# Patient Record
Sex: Female | Born: 1980
Health system: Southern US, Community
[De-identification: ages and names within clinical notes are randomized; demographics above are authoritative.]

## PROBLEM LIST (undated history)

## (undated) DIAGNOSIS — E785 Hyperlipidemia, unspecified: Secondary | ICD-10-CM

## (undated) DIAGNOSIS — IMO0002 Reserved for concepts with insufficient information to code with codable children: Secondary | ICD-10-CM

## (undated) DIAGNOSIS — D649 Anemia, unspecified: Secondary | ICD-10-CM

## (undated) DIAGNOSIS — Z91199 Patient's noncompliance with other medical treatment and regimen due to unspecified reason: Secondary | ICD-10-CM

## (undated) DIAGNOSIS — R200 Anesthesia of skin: Secondary | ICD-10-CM

## (undated) DIAGNOSIS — I10 Essential (primary) hypertension: Secondary | ICD-10-CM

## (undated) DIAGNOSIS — E119 Type 2 diabetes mellitus without complications: Secondary | ICD-10-CM

## (undated) DIAGNOSIS — G43909 Migraine, unspecified, not intractable, without status migrainosus: Secondary | ICD-10-CM

## (undated) DIAGNOSIS — R87619 Unspecified abnormal cytological findings in specimens from cervix uteri: Secondary | ICD-10-CM

## (undated) DIAGNOSIS — T7840XA Allergy, unspecified, initial encounter: Secondary | ICD-10-CM

## (undated) DIAGNOSIS — N189 Chronic kidney disease, unspecified: Secondary | ICD-10-CM

## (undated) DIAGNOSIS — Z9119 Patient's noncompliance with other medical treatment and regimen: Secondary | ICD-10-CM

## (undated) HISTORY — DX: Hyperlipidemia, unspecified: E78.5

## (undated) HISTORY — DX: Migraine, unspecified, not intractable, without status migrainosus: G43.909

## (undated) HISTORY — DX: Anemia, unspecified: D64.9

## (undated) HISTORY — DX: Essential (primary) hypertension: I10

## (undated) HISTORY — DX: Chronic kidney disease, unspecified: N18.9

## (undated) HISTORY — DX: Unspecified abnormal cytological findings in specimens from cervix uteri: R87.619

## (undated) HISTORY — DX: Allergy, unspecified, initial encounter: T78.40XA

## (undated) HISTORY — PX: WISDOM TOOTH EXTRACTION: SHX21

## (undated) HISTORY — DX: Reserved for concepts with insufficient information to code with codable children: IMO0002

---
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
## 2002-09-01 ENCOUNTER — Emergency Department (HOSPITAL_COMMUNITY): Admission: EM | Admit: 2002-09-01 | Discharge: 2002-09-01 | Payer: Self-pay | Admitting: Emergency Medicine

## 2003-02-21 ENCOUNTER — Emergency Department (HOSPITAL_COMMUNITY): Admission: EM | Admit: 2003-02-21 | Discharge: 2003-02-21 | Payer: Self-pay | Admitting: Emergency Medicine

## 2004-06-06 ENCOUNTER — Ambulatory Visit: Payer: Self-pay | Admitting: Internal Medicine

## 2004-06-06 ENCOUNTER — Ambulatory Visit: Payer: Self-pay | Admitting: *Deleted

## 2004-06-22 ENCOUNTER — Ambulatory Visit: Payer: Self-pay | Admitting: Internal Medicine

## 2004-07-11 ENCOUNTER — Ambulatory Visit: Payer: Self-pay | Admitting: Internal Medicine

## 2004-08-21 ENCOUNTER — Ambulatory Visit: Payer: Self-pay | Admitting: Internal Medicine

## 2004-09-19 ENCOUNTER — Ambulatory Visit: Payer: Self-pay | Admitting: Internal Medicine

## 2004-12-19 ENCOUNTER — Ambulatory Visit: Payer: Self-pay | Admitting: Internal Medicine

## 2009-08-02 ENCOUNTER — Emergency Department (HOSPITAL_COMMUNITY): Admission: EM | Admit: 2009-08-02 | Discharge: 2009-08-02 | Payer: Self-pay | Admitting: Family Medicine

## 2009-10-25 ENCOUNTER — Encounter (INDEPENDENT_AMBULATORY_CARE_PROVIDER_SITE_OTHER): Payer: Self-pay | Admitting: Family Medicine

## 2009-10-25 ENCOUNTER — Ambulatory Visit: Payer: Self-pay | Admitting: Internal Medicine

## 2009-10-25 LAB — CONVERTED CEMR LAB
Basophils Relative: 0 % (ref 0–1)
CO2: 23 meq/L (ref 19–32)
Calcium: 9.9 mg/dL (ref 8.4–10.5)
Chloride: 99 meq/L (ref 96–112)
Cholesterol: 246 mg/dL — ABNORMAL HIGH (ref 0–200)
Eosinophils Absolute: 0.1 10*3/uL (ref 0.0–0.7)
Glucose, Bld: 343 mg/dL — ABNORMAL HIGH (ref 70–99)
Hemoglobin: 11.8 g/dL — ABNORMAL LOW (ref 12.0–15.0)
MCHC: 31.6 g/dL (ref 30.0–36.0)
MCV: 79.7 fL (ref 78.0–100.0)
Microalb, Ur: 11.3 mg/dL — ABNORMAL HIGH (ref 0.00–1.89)
Monocytes Absolute: 0.4 10*3/uL (ref 0.1–1.0)
Monocytes Relative: 6 % (ref 3–12)
Neutro Abs: 2.8 10*3/uL (ref 1.7–7.7)
RBC: 4.68 M/uL (ref 3.87–5.11)
Sodium: 135 meq/L (ref 135–145)
Total Bilirubin: 0.5 mg/dL (ref 0.3–1.2)
Total Protein: 7.8 g/dL (ref 6.0–8.3)
Triglycerides: 106 mg/dL (ref <150)
VLDL: 21 mg/dL (ref 0–40)

## 2009-10-26 ENCOUNTER — Ambulatory Visit: Payer: Self-pay | Admitting: Internal Medicine

## 2009-10-26 ENCOUNTER — Encounter (INDEPENDENT_AMBULATORY_CARE_PROVIDER_SITE_OTHER): Payer: Self-pay | Admitting: Family Medicine

## 2009-10-31 ENCOUNTER — Encounter (INDEPENDENT_AMBULATORY_CARE_PROVIDER_SITE_OTHER): Payer: Self-pay | Admitting: Family Medicine

## 2009-10-31 LAB — CONVERTED CEMR LAB
Iron: 98 ug/dL (ref 42–145)
Saturation Ratios: 28 % (ref 20–55)
TIBC: 346 ug/dL (ref 250–470)
UIBC: 248 ug/dL

## 2009-11-08 ENCOUNTER — Ambulatory Visit: Payer: Self-pay | Admitting: Internal Medicine

## 2009-12-08 ENCOUNTER — Ambulatory Visit: Payer: Self-pay | Admitting: Internal Medicine

## 2009-12-08 ENCOUNTER — Encounter (INDEPENDENT_AMBULATORY_CARE_PROVIDER_SITE_OTHER): Payer: Self-pay | Admitting: Family Medicine

## 2009-12-08 LAB — CONVERTED CEMR LAB
AST: 10 units/L (ref 0–37)
Alkaline Phosphatase: 75 units/L (ref 39–117)
BUN: 12 mg/dL (ref 6–23)
C-Peptide: 0.86 ng/mL (ref 0.80–3.90)
Creatinine, Ser: 0.72 mg/dL (ref 0.40–1.20)
Glucose, Bld: 471 mg/dL — ABNORMAL HIGH (ref 70–99)
Hgb A2 Quant: 2.5 % (ref 2.2–3.2)
Hgb A: 97.5 % (ref 96.8–97.8)
Hgb F Quant: 0 % (ref 0.0–2.0)
Hgb S Quant: 0 % (ref 0.0–0.0)
Potassium: 4.7 meq/L (ref 3.5–5.3)
Total Bilirubin: 0.5 mg/dL (ref 0.3–1.2)

## 2009-12-13 ENCOUNTER — Ambulatory Visit: Payer: Self-pay | Admitting: Internal Medicine

## 2010-01-12 ENCOUNTER — Ambulatory Visit: Payer: Self-pay | Admitting: Family Medicine

## 2010-02-16 ENCOUNTER — Encounter (INDEPENDENT_AMBULATORY_CARE_PROVIDER_SITE_OTHER): Payer: Self-pay | Admitting: Family Medicine

## 2010-02-16 ENCOUNTER — Ambulatory Visit: Payer: Self-pay | Admitting: Family Medicine

## 2010-02-16 LAB — CONVERTED CEMR LAB
Calcium: 9.5 mg/dL (ref 8.4–10.5)
Cholesterol: 200 mg/dL (ref 0–200)
Creatinine, Ser: 0.71 mg/dL (ref 0.40–1.20)
HDL: 52 mg/dL (ref >39)
Hgb A1c MFr Bld: 8.2 % — ABNORMAL HIGH (ref <5.7)
Sodium: 143 meq/L (ref 135–145)
Total CHOL/HDL Ratio: 3.8

## 2010-02-22 ENCOUNTER — Ambulatory Visit: Payer: Self-pay | Admitting: Internal Medicine

## 2010-05-25 ENCOUNTER — Ambulatory Visit: Payer: Self-pay | Admitting: Internal Medicine

## 2010-05-25 ENCOUNTER — Encounter (INDEPENDENT_AMBULATORY_CARE_PROVIDER_SITE_OTHER): Payer: Self-pay | Admitting: Family Medicine

## 2010-05-25 LAB — CONVERTED CEMR LAB
ALT: 15 units/L (ref 0–35)
Cholesterol: 107 mg/dL (ref 0–200)
HDL: 40 mg/dL (ref >39)
Hgb A1c MFr Bld: 7.3 % — ABNORMAL HIGH (ref <5.7)
Total CHOL/HDL Ratio: 2.7
VLDL: 10 mg/dL (ref 0–40)

## 2010-07-31 ENCOUNTER — Encounter (INDEPENDENT_AMBULATORY_CARE_PROVIDER_SITE_OTHER): Payer: Self-pay | Admitting: *Deleted

## 2010-07-31 LAB — CONVERTED CEMR LAB
Albumin: 3.9 g/dL (ref 3.5–5.2)
BUN: 4 mg/dL — ABNORMAL LOW (ref 6–23)
CO2: 22 meq/L (ref 19–32)
Calcium: 8.9 mg/dL (ref 8.4–10.5)
Chloride: 110 meq/L (ref 96–112)
Eosinophils Absolute: 0.5 10*3/uL (ref 0.0–0.7)
Glucose, Bld: 108 mg/dL — ABNORMAL HIGH (ref 70–99)
HCT: 31.2 % — ABNORMAL LOW (ref 36.0–46.0)
Hemoglobin: 9.9 g/dL — ABNORMAL LOW (ref 12.0–15.0)
Lipase: 48 units/L (ref 0–75)
Lymphocytes Relative: 31 % (ref 12–46)
Lymphs Abs: 2 10*3/uL (ref 0.7–4.0)
MCHC: 31.7 g/dL (ref 30.0–36.0)
Monocytes Relative: 9 % (ref 3–12)
Platelets: 253 10*3/uL (ref 150–400)
Potassium: 4.3 meq/L (ref 3.5–5.3)
RDW: 13.9 % (ref 11.5–15.5)
TSH: 3.155 microintl units/mL (ref 0.350–4.500)

## 2010-08-01 ENCOUNTER — Encounter (INDEPENDENT_AMBULATORY_CARE_PROVIDER_SITE_OTHER): Payer: Self-pay | Admitting: Family Medicine

## 2010-08-01 ENCOUNTER — Encounter (INDEPENDENT_AMBULATORY_CARE_PROVIDER_SITE_OTHER): Payer: Self-pay | Admitting: *Deleted

## 2010-08-01 LAB — CONVERTED CEMR LAB: Ferritin: 20 ng/mL (ref 10–291)

## 2011-01-03 LAB — POCT I-STAT, CHEM 8
Calcium, Ion: 1.15 mmol/L (ref 1.12–1.32)
Chloride: 101 mEq/L (ref 96–112)
HCT: 39 % (ref 36.0–46.0)
Hemoglobin: 13.3 g/dL (ref 12.0–15.0)

## 2011-06-11 ENCOUNTER — Other Ambulatory Visit (HOSPITAL_COMMUNITY): Admission: RE | Admit: 2011-06-11 | Payer: Self-pay | Source: Ambulatory Visit | Admitting: Family Medicine

## 2011-06-11 ENCOUNTER — Other Ambulatory Visit: Payer: Self-pay | Admitting: Family Medicine

## 2011-07-31 ENCOUNTER — Other Ambulatory Visit (HOSPITAL_COMMUNITY): Payer: Self-pay | Admitting: Oral and Maxillofacial Surgery

## 2011-07-31 DIAGNOSIS — L0291 Cutaneous abscess, unspecified: Secondary | ICD-10-CM

## 2011-07-31 DIAGNOSIS — R220 Localized swelling, mass and lump, head: Secondary | ICD-10-CM

## 2011-08-02 ENCOUNTER — Ambulatory Visit (HOSPITAL_COMMUNITY)
Admission: RE | Admit: 2011-08-02 | Discharge: 2011-08-02 | Disposition: A | Discharge status: Home / Self Care | Payer: Self-pay | Source: Ambulatory Visit | Attending: Oral and Maxillofacial Surgery | Admitting: Oral and Maxillofacial Surgery

## 2011-08-02 DIAGNOSIS — R220 Localized swelling, mass and lump, head: Secondary | ICD-10-CM | POA: Insufficient documentation

## 2011-08-02 DIAGNOSIS — L0291 Cutaneous abscess, unspecified: Secondary | ICD-10-CM

## 2011-08-02 MED ORDER — IOHEXOL 300 MG/ML  SOLN
80.0000 mL | Freq: Once | INTRAMUSCULAR | Status: AC | PRN
  Administered 2011-08-02: 80 mL via INTRAVENOUS

## 2011-08-09 ENCOUNTER — Ambulatory Visit (INDEPENDENT_AMBULATORY_CARE_PROVIDER_SITE_OTHER): Payer: Self-pay | Admitting: Family Medicine

## 2011-08-09 ENCOUNTER — Encounter: Payer: Self-pay | Admitting: *Deleted

## 2011-08-09 ENCOUNTER — Other Ambulatory Visit (HOSPITAL_COMMUNITY)
Admission: RE | Admit: 2011-08-09 | Discharge: 2011-08-09 | Disposition: A | Discharge status: Home / Self Care | Payer: Self-pay | Source: Ambulatory Visit | Attending: Obstetrics & Gynecology | Admitting: Obstetrics & Gynecology

## 2011-08-09 ENCOUNTER — Inpatient Hospital Stay: Admission: RE | Admit: 2011-08-09 | Payer: Self-pay | Source: Ambulatory Visit

## 2011-08-09 DIAGNOSIS — R87610 Atypical squamous cells of undetermined significance on cytologic smear of cervix (ASC-US): Secondary | ICD-10-CM

## 2011-08-09 DIAGNOSIS — Z01812 Encounter for preprocedural laboratory examination: Secondary | ICD-10-CM

## 2011-08-09 DIAGNOSIS — R87619 Unspecified abnormal cytological findings in specimens from cervix uteri: Secondary | ICD-10-CM | POA: Insufficient documentation

## 2011-08-09 LAB — POCT PREGNANCY, URINE: Preg Test, Ur: NEGATIVE

## 2011-08-09 NOTE — Patient Instructions (Signed)
Colposcopy Colposcopy is a procedure that uses a special lighted microscope (colposcope). It examines your cervix and vagina, or the area around the outside of the vagina, for signs of disease or abnormalities in the cells. You may be sent to a specialist (gynecologist) to do the colposcopy. A biopsy (tissue sample) may be collected during a colposcopy, if the caregiver finds any unusual cells. The biopsy is sent to the lab for further testing, and the results are reported back to your caregiver. A WOMAN MAY NEED THIS PROCEDURE IF:  She has had an abnormal pap smear (taking cells from the cervix for testing).   She has a sore on her cervix, and a Pap test was normal.   The Pap test suggests human papilloma virus (HPV). This virus can cause genital warts and is linked to the development of cervical cancer.   She has genital warts on the cervix, or in or around the outside of the vagina.   Her mother took the drug DES while pregnant.   She has painful intercourse.   She has vaginal bleeding, especially after sexual intercourse.   There is a need to evaluate the results of previous treatment.  BEFORE THE PROCEDURE   Colposcopy is done when you are not having a menstrual period.   For 24 hours before the colposcopy, do not:   Douche.   Use tampons.   Use medicines, creams, or suppositories in the vagina.   Have sexual intercourse.  PROCEDURE   A colposcopy is done while a woman is lying on her back with her feet in foot rests (stirrups).   A speculum is placed inside the vagina to keep it open and to allow the caregiver to see the cervix. This is the same instrument used to do a pap smear.   The colposcope is placed outside the vagina. It is used to magnify and examine the cervix, vagina, and the area around the outside of the vagina.   A small amount of liquid solution is placed on the area that is to be viewed. This solution is placed on with a cotton applicator. This solution  makes it easier to see the abnormal cells.   Your caregiver will suck out mucus and cells from the canal of the cervix.   Small pieces of tissue for biopsy may be taken at the same time. You may feel mild pain or discomfort when this is done.   Your caregiver will record the location of the abnormal areas and send the tissue samples to a lab for analysis.   If your caregiver biopsies the vagina or outside of the vagina, a local anesthetic (novocaine) is usually given.  AFTER THE PROCEDURE   You may have some cramping that often goes away in a few minutes. You may have some soreness for a couple of days.   You may take over-the-counter pain medicine as advised by your caregiver. Do not take aspirin because it can cause bleeding.   Lie down for a few minutes if you feel lightheaded.   You may have some bleeding or dark discharge that should stop in a few days.   You may need to wear a sanitary pad for a few days.  HOME CARE INSTRUCTIONS   Avoid sex, douching, and using tampons for a week or as directed.   Only take medicine as directed by your caregiver.   Continue to take birth control pills, if you are on them.   Not all test results are  available during your visit. If your test results are not back during the visit, make an appointment with your caregiver to find out the results. Do not assume everything is normal if you have not heard from your caregiver or the medical facility. It is important for you to follow up on all of your test results.   Follow your caregiver's advice regarding medicines, activity, follow-up visits, and follow-up Pap tests.  SEEK MEDICAL CARE IF:   You develop a rash.   You have problems with your medicine.  SEEK IMMEDIATE MEDICAL CARE IF:  You are bleeding heavily or are passing blood clots.   You develop a fever over 102 F (38.9 C), with or without chills.   You have abnormal vaginal discharge.   You are having cramps that do not go away  after taking your pain medicine.   You feel lightheaded, dizzy, or faint.   You develop stomach pain.  Document Released: 12/08/2002 Document Revised: 05/30/2011 Document Reviewed: 07/21/2009 [facility] Patient Information 2012 [place].

## 2011-08-09 NOTE — Progress Notes (Signed)
  Subjective:    Patient ID: Cathy Green, female    DOB: September 12, 1981, 30 y.o.   MRN: [id number]  HPI She had her first pap in 2011 and it was normal.  Her second pap was this year and was ASCUS with positive HR HPV DNA.   Review of Systems     Objective:   Physical Exam  Entirely normal colposcopy. I did an ECC.      Assessment & Plan:  H/o abnl pap status with normal colpo. She will get her 6 month f/u pap at [facility].

## 2011-08-09 NOTE — Progress Notes (Signed)
Addended by: Christiana Pellant A on: 08/09/2011 04:57 PM   Modules accepted: Orders

## 2011-08-31 ENCOUNTER — Telehealth: Payer: Self-pay | Admitting: Hematology and Oncology

## 2011-08-31 NOTE — Telephone Encounter (Signed)
S/w pt today re appt for 12/7 @ 1:30 pm.

## 2011-09-07 ENCOUNTER — Encounter: Payer: Self-pay | Admitting: Hematology and Oncology

## 2011-09-07 ENCOUNTER — Ambulatory Visit (HOSPITAL_BASED_OUTPATIENT_CLINIC_OR_DEPARTMENT_OTHER): Payer: Self-pay

## 2011-09-07 ENCOUNTER — Ambulatory Visit: Payer: Self-pay

## 2011-09-07 ENCOUNTER — Ambulatory Visit (HOSPITAL_BASED_OUTPATIENT_CLINIC_OR_DEPARTMENT_OTHER): Payer: Self-pay | Admitting: Hematology and Oncology

## 2011-09-07 ENCOUNTER — Telehealth: Payer: Self-pay | Admitting: Hematology and Oncology

## 2011-09-07 ENCOUNTER — Other Ambulatory Visit: Payer: Self-pay | Admitting: Hematology and Oncology

## 2011-09-07 VITALS — BP 145/96 | HR 89 | Temp 98.7°F | Ht 62.0 in | Wt 142.2 lb

## 2011-09-07 DIAGNOSIS — E785 Hyperlipidemia, unspecified: Secondary | ICD-10-CM | POA: Insufficient documentation

## 2011-09-07 DIAGNOSIS — D631 Anemia in chronic kidney disease: Secondary | ICD-10-CM | POA: Insufficient documentation

## 2011-09-07 DIAGNOSIS — E119 Type 2 diabetes mellitus without complications: Secondary | ICD-10-CM

## 2011-09-07 DIAGNOSIS — D649 Anemia, unspecified: Secondary | ICD-10-CM

## 2011-09-07 DIAGNOSIS — I10 Essential (primary) hypertension: Secondary | ICD-10-CM

## 2011-09-07 DIAGNOSIS — E1029 Type 1 diabetes mellitus with other diabetic kidney complication: Secondary | ICD-10-CM | POA: Insufficient documentation

## 2011-09-07 LAB — URINALYSIS, MICROSCOPIC - CHCC
Bilirubin (Urine): NEGATIVE
Glucose: 2 g/dL
Ketones: NEGATIVE mg/dL
Leukocyte Esterase: NEGATIVE
pH: 6 (ref 4.6–8.0)

## 2011-09-07 LAB — CBC WITH DIFFERENTIAL/PLATELET
BASO%: 0.5 % (ref 0.0–2.0)
Eosinophils Absolute: 0.1 10*3/uL (ref 0.0–0.5)
HCT: 34.7 % — ABNORMAL LOW (ref 34.8–46.6)
LYMPH%: 41 % (ref 14.0–49.7)
MCHC: 33.4 g/dL (ref 31.5–36.0)
MONO#: 0.2 10*3/uL (ref 0.1–0.9)
NEUT#: 2.1 10*3/uL (ref 1.5–6.5)
NEUT%: 52.4 % (ref 38.4–76.8)
Platelets: 283 10*3/uL (ref 145–400)
WBC: 4.1 10*3/uL (ref 3.9–10.3)
lymph#: 1.7 10*3/uL (ref 0.9–3.3)

## 2011-09-07 LAB — COMPREHENSIVE METABOLIC PANEL
Albumin: 3.2 g/dL — ABNORMAL LOW (ref 3.5–5.2)
Alkaline Phosphatase: 68 U/L (ref 39–117)
BUN: 9 mg/dL (ref 6–23)
Calcium: 9.4 mg/dL (ref 8.4–10.5)
Glucose, Bld: 171 mg/dL — ABNORMAL HIGH (ref 70–99)
Sodium: 139 mEq/L (ref 135–145)

## 2011-09-07 LAB — LACTATE DEHYDROGENASE: LDH: 147 U/L (ref 94–250)

## 2011-09-07 LAB — CHCC SMEAR

## 2011-09-07 MED ORDER — FE FUM-VIT C-VIT B12-FA 460-60-0.01-1 MG PO CAPS: 1.0000 | ORAL_CAPSULE | Freq: Every day | ORAL | Status: DC

## 2011-09-07 NOTE — Telephone Encounter (Signed)
gv pt feb appt schedule

## 2011-09-07 NOTE — Progress Notes (Signed)
This office note has been dictated.

## 2011-09-10 NOTE — Progress Notes (Signed)
CC:   Cathy Cheadle, MD  IDENTIFYING STATEMENT:  The patient is a 30 year old woman seen at the request of Dr. Brigitte Pulse with anemia.  HISTORY OF PRESENT ILLNESS:  The patient states that she was not aware that she was anemic.  She found out a few months ago following an annual physical.  At that time, she was placed on prescription iron, ferrous sulfate, which she was taking once a day.  I do not have a CBC to review from September 2012, but I note that looking at her lab results a year ago her hemoglobin and hematocrit were 9.9 and 31.2, respectively.  The ferritin was 20.  The patient denies overt blood loss.  History is negative for rectal bleeding.  She denies menorrhagia.  She is on the oral contraceptive pill.  The patient states she eats a well-balanced diet.  She does not take multivitamins but was placed of folic acid at her last visit.  Her weight is stable.  She denies history for sickle cell disease.  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 She does not see a nephrologist.  She was getting frequent eye exams until last year.  The patient states that all iron pills are hard on her stomach.  She notes a mild GI upset and is prone to constipation.  PAST MEDICAL HISTORY: 1. Diabetes mellitus. 2. Hypercholesterolemia. 3. Hypertension. 4. Glaucoma. 5. History of kidney disease. 6. Status post appendectomy. 7. Status post back surgery. 8. Status post recent tooth extraction.  ALLERGIES:  None.  MEDICATIONS: 1. Ferrous sulfate 325 mg once a day. 2. Folic acid 1 mg once a day. 3. Humulin R. 4. Lisinopril 10 mg daily. 5. Novolin insulin. 6. Ortho Tri-Cyclen. 7. Pravastatin 60 mg daily.  SOCIAL HISTORY:  The patient is single.  She has no children.  She denies alcohol tobacco use.  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  FAMILY HISTORY:  Maternal grandfather had prostate  cancer.  Family history is negative for anemia.  The patient gets her most of her healthcare through [facility] with Cathy Green.  REVIEW OF SYSTEMS:  Constitutional:  She denies fever, chills, night sweats, anorexia, or weight loss.  GI:  She denies nausea, vomiting, abdominal pain, diarrhea, melena, or hematochezia.  GU:  She denies dysuria, hematuria, nocturia, or frequency.  Cardiovascular:  She denies chest pain, PND, orthopnea, or ankle swelling.  Respirations:  She denies cough, hemoptysis, wheeze, or shortness of breath. Musculoskeletal:  She denies joint aches or muscle pains.  Skin:  She denies bruising or bleeding.  Neurologic:  She denies headaches, vision change, or extremity weakness.  The rest of the review of systems is negative.  PHYSICAL EXAMINATION:  General Appearance:  The patient is a well- appearing, well-nourished woman in no distress.  Vital Signs:  Pulse 89. Blood pressure 145/96.  Temp 98.7.  Respirations 20.  Weight is 142 pounds.  HEENT:  Head is atraumatic, normocephalic.  Extraocular muscles are intact.  Sclerae are anicteric.  Pupils are equal, round, and reactive to light.  Mouth is moist without ulcerations, thrush, or lesions.  Neck:  Supple without adenopathy.  Trachea is center.  Chest: Demonstrates good air entry bilaterally.  Clear to both percussion and auscultation.  Cardiovascular Exam:  Reveals first and second heart sounds were present.  No added sounds or murmurs.  Abdomen:  Soft and nontender.  There are no masses.  There is no hepatomegaly.  Bowel sounds are present.  Extremities:  No edema.  Pulses are present and symmetrical.  CNS:  Nonfocal.  Lymph Nodes:  No palpable adenopathy.  IMPRESSION AND PLAN:  Cathy Green is a pleasant, 30 year old, woman felt to be recently iron deficient.  Despite improvement in the ferritin of 92, hemoglobin was 9.6 g on 07/30/2011.  The etiology for her mild anemia may be multifactorial.  The differential  includes dietary versus anemia of chronic disease.  The patient has diabetes.  She is asymptomatic at this point in time.  With this said, I will have her repeat a CBC with diff and comprehensive metabolic panel.  She is to obtain iron, TIBC, ferritin, and serum folate levels.  We will obtain urinalysis to rule out blood.  We will also obtain a hemoglobin electrophoresis to rule out sickle cell disease.  She was given occult stool cards.  In the interim, I have asked the patient to continue with oral iron, but we will switch her to  something more tolerable such as Hematogen forte once daily.  She had a number of questions which were answered to her satisfaction.  We spent more than half the time discussing disease, diagnosis, and coordinating care.    ______________________________ Cathy Green, M.D. LIO/MEDQ  D:  09/07/2011  T:  09/07/2011  Job:  [id number]

## 2011-09-11 ENCOUNTER — Other Ambulatory Visit: Payer: Self-pay | Admitting: *Deleted

## 2011-09-11 LAB — HAPTOGLOBIN: Haptoglobin: 314 mg/dL — ABNORMAL HIGH (ref 30–200)

## 2011-09-11 LAB — IRON AND TIBC
%SAT: 25 % (ref 20–55)
Iron: 81 ug/dL (ref 42–145)

## 2011-09-11 LAB — FERRITIN: Ferritin: 70 ng/mL (ref 10–291)

## 2011-09-13 ENCOUNTER — Telehealth: Payer: Self-pay | Admitting: *Deleted

## 2011-09-13 NOTE — Telephone Encounter (Signed)
Spoke with mother at home today.   Informed mother re:  Pt's  Hgb  Level good;  Pt to continue with iron as instructed by md.   Gave mother date and time for pt's  appt with md in Feb. 2013.   Mother stated she would relay message to pt.

## 2011-11-14 ENCOUNTER — Other Ambulatory Visit: Payer: Self-pay | Admitting: Lab

## 2011-11-14 ENCOUNTER — Ambulatory Visit: Payer: Self-pay | Admitting: Hematology and Oncology

## 2011-11-16 ENCOUNTER — Telehealth: Payer: Self-pay | Admitting: Hematology and Oncology

## 2011-11-16 ENCOUNTER — Other Ambulatory Visit: Payer: Self-pay | Admitting: Nurse Practitioner

## 2011-11-16 NOTE — Telephone Encounter (Signed)
Called pt lmovm for appt on 03/19. asked pt to rtn call to confirm appts

## 2011-12-17 ENCOUNTER — Other Ambulatory Visit: Payer: Self-pay | Admitting: *Deleted

## 2011-12-17 DIAGNOSIS — D649 Anemia, unspecified: Secondary | ICD-10-CM

## 2011-12-18 ENCOUNTER — Ambulatory Visit: Payer: Self-pay | Admitting: Hematology and Oncology

## 2011-12-18 ENCOUNTER — Other Ambulatory Visit: Payer: Self-pay | Admitting: Lab

## 2014-04-20 ENCOUNTER — Ambulatory Visit: Payer: Self-pay | Admitting: *Deleted

## 2014-06-01 ENCOUNTER — Encounter: Payer: Self-pay | Admitting: *Deleted

## 2014-06-01 ENCOUNTER — Encounter: Payer: Self-pay | Attending: Nurse Practitioner | Admitting: *Deleted

## 2014-06-01 DIAGNOSIS — E119 Type 2 diabetes mellitus without complications: Secondary | ICD-10-CM | POA: Insufficient documentation

## 2014-06-01 DIAGNOSIS — Z713 Dietary counseling and surveillance: Secondary | ICD-10-CM | POA: Insufficient documentation

## 2014-06-01 NOTE — Patient Instructions (Addendum)
Appt at [facility]: [address] [facility] [id number] Wednesday 06/09/14 @3 :00 Bring Photo ID, Medications and insurance  Go to [facility] and pick up Insulin NPH for $10.00 and test strips #50 for $18.00.  Take NPH 28 units with breakfast and dinner Take Novolog per sliding scale 151-200 4u 201-250 6u 251-300 8u  301-350 10u Test glucose FBS and before lunch and dinner and utilize sliding scale

## 2014-06-01 NOTE — Progress Notes (Signed)
Appt start time: 0930 end time:  1100.  Assessment:  Patient was seen on  06/01/14 for individual diabetes education. Cathy Green was diagnosed with T2DM at the age of 33yo.  She was raised by her grandparents and continues to live with them. Has not been working. She has no knowledge of DM on her mother's side of the family and she is unaware of her father's medical history.  Was a Conservation officer, historic buildings. Received Bachelors degree in [employer] and has been unable to obtain a position. Due to visual changes as a result of uncontrolled glucose, she has decided not to drive a car at this time.  She does not currently has insurance, is currently attempting to acquire insurance. Her last her insurance expired 04/30/13. She presently has about 2 days worth of insulin. She noted that since she did not have any insurance she was unable to return to [facility] at this time. I contacted [facility] and obtained an appointment for her on Weds 06/09/14 @ 3:00. She will be able to go to [facility] and obtain her NPH for $10.00 and test strips for $18.00 for #50. She will now be able to take NPH 28u BID and Novolog sliding scale.   Patient Education Plan per assessed needs and concerns is to attend individual session for Diabetes Self Management Education.  Current HbA1c: 10.9%  Preferred Learning Style:   No preference indicated   Learning Readiness:   Ready  MEDICATIONS: Insulin, NPH, Novolog. She is out of her Atorvastatin and lisinopril (BP 122/82)  DIETARY INTAKE:   B ( AM): None  Snk ( AM): none  L ( PM): grits, eggs, bacon/sausage, water, ice tea with sugar Snk ( PM): chips, fruit D ( PM): chicken, beans, greens, rice/potatos, macaroni & cheese Snk ( PM): ice cream, chips, fruit, vegetables Beverages: water, ice tea with sugar, regular Pepsi, Fanta orange, Coke zero  Usual physical activity: walks neighborhood 2-3 times per week.  Intervention:  Nutrition counseling provided.  Discussed  diabetes disease process and treatment options.  Discussed physiology of diabetes and role of obesity on insulin resistance.  Encouraged moderate weight reduction to improve glucose levels.  Discussed role of medications and diet in glucose control  Provided education on macronutrients on glucose levels.  Provided education on carb counting, importance of regularly scheduled meals/snacks, and meal planning  Discussed effects of physical activity on glucose levels and long-term glucose control.  Recommended 150 minutes of physical activity/week.  Reviewed patient medications.  Discussed role of medication on blood glucose and possible side effects  Discussed blood glucose monitoring and interpretation.  Discussed recommended target ranges and individual ranges.    Described short-term complications: hyper- and hypo-glycemia.  Discussed causes,symptoms, and treatment options.  Discussed prevention, detection, and treatment of long-term complications.  Discussed the role of prolonged elevated glucose levels on body systems.  Discussed role of stress on blood glucose levels and discussed strategies to manage psychosocial issues.  Discussed recommendations for long-term diabetes self-care.  Established checklist for medical, dental, and emotional self-care.  Appt at [facility]: [address] [facility] [id number] Wednesday 06/09/14 @3 :00 Bring Photo ID, Medications and insurance  Go to [facility] and pick up Insulin NPH for $10.00 and test strips #50 for $18.00.  Take NPH 28 units with breakfast and dinner Take Novolog per sliding scale 151-200 4u 201-250 6u 251-300 8u  301-350 10u Test glucose FBS and before lunch and dinner and utilize sliding scale  Teaching Method Utilized:  Visual Auditory Hands on  Handouts given during visit include: Living Well with Diabetes Carb Counting and Food Label handouts Meal Plan Card My plate  Snacks   Barriers to  learning/adherence to lifestyle change: finances  Diabetes self-care support plan:   [facility] support group  [facility]  Demonstrated degree of understanding via:  Teach Back   Monitoring/Evaluation:  Dietary intake, exercise, test glucose, and body weight in 1 month(s).

## 2014-06-09 ENCOUNTER — Ambulatory Visit: Payer: Self-pay | Attending: Internal Medicine | Admitting: Internal Medicine

## 2014-06-09 ENCOUNTER — Encounter: Payer: Self-pay | Admitting: Internal Medicine

## 2014-06-09 VITALS — BP 133/91 | HR 105 | Temp 98.2°F | Ht 62.0 in | Wt 149.8 lb

## 2014-06-09 DIAGNOSIS — Z139 Encounter for screening, unspecified: Secondary | ICD-10-CM

## 2014-06-09 DIAGNOSIS — E785 Hyperlipidemia, unspecified: Secondary | ICD-10-CM

## 2014-06-09 DIAGNOSIS — E1059 Type 1 diabetes mellitus with other circulatory complications: Secondary | ICD-10-CM

## 2014-06-09 DIAGNOSIS — I10 Essential (primary) hypertension: Secondary | ICD-10-CM

## 2014-06-09 LAB — CBC WITH DIFFERENTIAL/PLATELET
Basophils Absolute: 0 10*3/uL (ref 0.0–0.1)
Basophils Relative: 1 % (ref 0–1)
EOS ABS: 0.1 10*3/uL (ref 0.0–0.7)
EOS PCT: 2 % (ref 0–5)
HEMATOCRIT: 35.8 % — AB (ref 36.0–46.0)
HEMOGLOBIN: 11.5 g/dL — AB (ref 12.0–15.0)
LYMPHS ABS: 1.6 10*3/uL (ref 0.7–4.0)
Lymphocytes Relative: 47 % — ABNORMAL HIGH (ref 12–46)
MCH: 26.3 pg (ref 26.0–34.0)
MCHC: 32.1 g/dL (ref 30.0–36.0)
MCV: 81.9 fL (ref 78.0–100.0)
MONO ABS: 0.2 10*3/uL (ref 0.1–1.0)
MONOS PCT: 7 % (ref 3–12)
Neutro Abs: 1.5 10*3/uL — ABNORMAL LOW (ref 1.7–7.7)
Neutrophils Relative %: 43 % (ref 43–77)
Platelets: 302 10*3/uL (ref 150–400)
RBC: 4.37 MIL/uL (ref 3.87–5.11)
RDW: 13.8 % (ref 11.5–15.5)
WBC: 3.5 10*3/uL — ABNORMAL LOW (ref 4.0–10.5)

## 2014-06-09 LAB — GLUCOSE, POCT (MANUAL RESULT ENTRY): POC Glucose: 117 mg/dl — AB (ref 70–99)

## 2014-06-09 LAB — POCT GLYCOSYLATED HEMOGLOBIN (HGB A1C): Hemoglobin A1C: 11

## 2014-06-09 MED ORDER — ATORVASTATIN CALCIUM 40 MG PO TABS: 40.0000 mg | ORAL_TABLET | Freq: Every day | ORAL | Status: DC

## 2014-06-09 MED ORDER — LISINOPRIL 10 MG PO TABS: 10.0000 mg | ORAL_TABLET | Freq: Every day | ORAL | Status: DC

## 2014-06-09 NOTE — Progress Notes (Signed)
Patient is here to establish care. Taking insulin at night time. She is needing refills on all of her other medications. DM and HTN

## 2014-06-09 NOTE — Progress Notes (Signed)
Patient Demographics  Cathy Green, is a 33 y.o. female  [id number]  [id number]  DOB - 1980/12/25  CC:  Chief Complaint  Patient presents with  . Establish Care  . Diabetes  . Hypertension       HPI: Cathy Green is a 33 y.o. female here today to establish medical care. Patient has history of type 1 diabetes for several years, as per patient she recently resumed back on her insulin currently taking Novolin and 28 units 2 times a day, also on NovoLog sliding scale, her A1c today is 11%, she denies any hypoglycemic symptoms. Patient is requesting refill on her blood pressure and cholesterol medication. Patient has No headache, No chest pain, No abdominal pain - No Nausea, No new weakness tingling or numbness, No Cough - SOB.  No Known Allergies Past Medical History  Diagnosis Date  . Abnormal Pap smear   . Diabetes mellitus     diagnosed age 53  . Hypertension   . Hyperlipidemia   . Chronic kidney disease   . Migraines   . Anemia   . Allergy     seasonal allergies  . Glaucoma    Current Outpatient Prescriptions on File Prior to Visit  Medication Sig Dispense Refill  . aspirin (ASPIRIN EC) 81 MG EC tablet Take 81 mg by mouth daily. Swallow whole.      . folic acid (FOLVITE) 1 MG tablet Take 1 mg by mouth daily.        . insulin NPH (HUMULIN N,NOVOLIN N) 100 UNIT/ML injection Inject 24 Units into the skin 2 (two) times daily.        . multivitamin (THERAGRAN) per tablet Take 1 tablet by mouth daily.        . Norgestimate-Ethinyl Estradiol Triphasic (ORTHO TRI-CYCLEN, 28,) 0.18/0.215/0.25 MG-35 MCG tablet Take 1 tablet by mouth daily.        . pravastatin (PRAVACHOL) 40 MG tablet Take 40 mg by mouth daily.         No current facility-administered medications on file prior to visit.   Family History  Problem Relation Age of Onset  . Cancer Maternal Grandfather     prostate  . Hypertension Mother   . Stroke Mother   . Cancer Other   . COPD Other   .  Hyperlipidemia Other   . Hypertension Father    History   Social History  . Marital Status: Single    Spouse Name: N/A    Number of Children: N/A  . Years of Education: N/A   Occupational History  . Not on file.   Social History Main Topics  . Smoking status: Never Smoker   . Smokeless tobacco: Never Used  . Alcohol Use: Yes     Comment: rare  . Drug Use: Not on file  . Sexual Activity: No   Other Topics Concern  . Not on file   Social History Narrative  . No narrative on file    Review of Systems: Constitutional: Negative for fever, chills, diaphoresis, activity change, appetite change and fatigue. HENT: Negative for ear pain, nosebleeds, congestion, facial swelling, rhinorrhea, neck pain, neck stiffness and ear discharge.  Eyes: Negative for pain, discharge, redness, itching and visual disturbance. Respiratory: Negative for cough, choking, chest tightness, shortness of breath, wheezing and stridor.  Cardiovascular: Negative for chest pain, palpitations and leg swelling. Gastrointestinal: Negative for abdominal distention. Genitourinary: Negative for dysuria, urgency, frequency, hematuria, flank pain, decreased urine volume, difficulty urinating and dyspareunia.  Musculoskeletal: Negative for back pain, joint swelling, arthralgia and gait problem. Neurological: Negative for dizziness, tremors, seizures, syncope, facial asymmetry, speech difficulty, weakness, light-headedness, numbness and headaches.  Hematological: Negative for adenopathy. Does not bruise/bleed easily. Psychiatric/Behavioral: Negative for hallucinations, behavioral problems, confusion, dysphoric mood, decreased concentration and agitation.    Objective:   Filed Vitals:   06/09/14 1531  BP: 133/91  Pulse: 105  Temp: 98.2 F (36.8 C)    Physical Exam: Constitutional: Patient appears well-developed and well-nourished. No distress. HENT: Normocephalic, atraumatic, External right and left ear  normal. Oropharynx is clear and moist.  Eyes: Conjunctivae and EOM are normal. PERRLA, no scleral icterus. Neck: Normal ROM. Neck supple. No JVD. No tracheal deviation. No thyromegaly. CVS: RRR, S1/S2 +, no murmurs, no gallops, no carotid bruit.  Pulmonary: Effort and breath sounds normal, no stridor, rhonchi, wheezes, rales.  Abdominal: Soft. BS +, no distension, tenderness, rebound or guarding.  Musculoskeletal: Normal range of motion. No edema and no tenderness.  Neuro: Alert. Normal reflexes, muscle tone coordination. No cranial nerve deficit. Skin: Skin is warm and dry. No rash noted. Not diaphoretic. No erythema. No pallor. Psychiatric: Normal mood and affect. Behavior, judgment, thought content normal.  Lab Results  Component Value Date   WBC 4.1 09/07/2011   HGB 11.6 09/07/2011   HCT 34.7* 09/07/2011   MCV 79.4* 09/07/2011   PLT 283 09/07/2011   Lab Results  Component Value Date   CREATININE 0.82 09/07/2011   BUN 9 09/07/2011   NA 139 09/07/2011   K 3.5 09/07/2011   CL 103 09/07/2011   CO2 26 09/07/2011    Lab Results  Component Value Date   HGBA1C 11.0 06/09/2014   Lipid Panel     Component Value Date/Time   CHOL 107 05/25/2010 2031   TRIG 52 05/25/2010 2031   HDL 40 05/25/2010 2031   CHOLHDL 2.7 Ratio 05/25/2010 2031   VLDL 10 05/25/2010 2031   LDLCALC 57 05/25/2010 2031       Assessment and plan:   1. Type I (juvenile type) diabetes mellitus with peripheral circulatory disorders, not stated as uncontrolled(250.71) Results for orders placed in visit on 06/09/14  POCT GLYCOSYLATED HEMOGLOBIN (HGB A1C)      Result Value Ref Range   Hemoglobin A1C 11.0    GLUCOSE, POCT (MANUAL RESULT ENTRY)      Result Value Ref Range   POC Glucose 117 (*) 70 - 99 mg/dl   Diabetes is uncontrolled patient will increase the dose of Novolin to 30 units twice a day, also advise for diabetes meal planning, continue with NovoLog sliding scale, will repeat her A1c in 3 months. - COMPLETE  METABOLIC PANEL WITH GFR  2. Essential hypertension  - Patient is currently on lisinopril (PRINIVIL,ZESTRIL) 10 MG tablet; Take 1 tablet (10 mg total) by mouth daily.  Dispense: 30 tablet; Refill: 3 - COMPLETE METABOLIC PANEL WITH GFR  3. Hyperlipidemia Resume back on Lipitor, check lipid panel. - atorvastatin (LIPITOR) 40 MG tablet; Take 1 tablet (40 mg total) by mouth daily.  Dispense: 30 tablet; Refill: 3  4. Screening Ordered baseline blood work. - CBC with Differential - TSH - Lipid panel - Vit D  25 hydroxy (rtn osteoporosis monitoring)     Return in about 3 months (around 09/08/2014) for diabetes, hypertension, hyperipidemia.    Lorayne Marek, MD

## 2014-06-09 NOTE — Patient Instructions (Signed)
Diabetes Mellitus and Food It is important for you to manage your blood sugar (glucose) level. Your blood glucose level can be greatly affected by what you eat. Eating healthier foods in the appropriate amounts throughout the day at about the same time each day will help you control your blood glucose level. It can also help slow or prevent worsening of your diabetes mellitus. Healthy eating may even help you improve the level of your blood pressure and reach or maintain a healthy weight.  HOW CAN FOOD AFFECT ME? Carbohydrates Carbohydrates affect your blood glucose level more than any other type of food. Your dietitian will help you determine how many carbohydrates to eat at each meal and teach you how to count carbohydrates. Counting carbohydrates is important to keep your blood glucose at a healthy level, especially if you are using insulin or taking certain medicines for diabetes mellitus. Alcohol Alcohol can cause sudden decreases in blood glucose (hypoglycemia), especially if you use insulin or take certain medicines for diabetes mellitus. Hypoglycemia can be a life-threatening condition. Symptoms of hypoglycemia (sleepiness, dizziness, and disorientation) are similar to symptoms of having too much alcohol.  If your health care provider has given you approval to drink alcohol, do so in moderation and use the following guidelines:  Women should not have more than one drink per day, and men should not have more than two drinks per day. One drink is equal to:  12 oz of beer.  5 oz of wine.  1 oz of hard liquor.  Do not drink on an empty stomach.  Keep yourself hydrated. Have water, diet soda, or unsweetened iced tea.  Regular soda, juice, and other mixers might contain a lot of carbohydrates and should be counted. WHAT FOODS ARE NOT RECOMMENDED? As you make food choices, it is important to remember that all foods are not the same. Some foods have fewer nutrients per serving than other  foods, even though they might have the same number of calories or carbohydrates. It is difficult to get your body what it needs when you eat foods with fewer nutrients. Examples of foods that you should avoid that are high in calories and carbohydrates but low in nutrients include:  Trans fats (most processed foods list trans fats on the Nutrition Facts label).  Regular soda.  Juice.  Candy.  Sweets, such as cake, pie, doughnuts, and cookies.  Fried foods. WHAT FOODS CAN I EAT? Have nutrient-rich foods, which will nourish your body and keep you healthy. The food you should eat also will depend on several factors, including:  The calories you need.  The medicines you take.  Your weight.  Your blood glucose level.  Your blood pressure level.  Your cholesterol level. You also should eat a variety of foods, including:  Protein, such as meat, poultry, fish, tofu, nuts, and seeds (lean animal proteins are best).  Fruits.  Vegetables.  Dairy products, such as milk, cheese, and yogurt (low fat is best).  Breads, grains, pasta, cereal, rice, and beans.  Fats such as olive oil, trans fat-free margarine, canola oil, avocado, and olives. DOES EVERYONE WITH DIABETES MELLITUS HAVE THE SAME MEAL PLAN? Because every person with diabetes mellitus is different, there is not one meal plan that works for everyone. It is very important that you meet with a dietitian who will help you create a meal plan that is just right for you. Document Released: 06/14/2005 Document Revised: 09/22/2013 Document Reviewed: 08/14/2013 ExitCare Patient Information 2015 [employer]. This   information is not intended to replace advice given to you by your health care provider. Make sure you discuss any questions you have with your health care provider. DASH Eating Plan DASH stands for "Dietary Approaches to Stop Hypertension." The DASH eating plan is a healthy eating plan that has been shown to reduce high  blood pressure (hypertension). Additional health benefits may include reducing the risk of type 2 diabetes mellitus, heart disease, and stroke. The DASH eating plan may also help with weight loss. WHAT DO I NEED TO KNOW ABOUT THE DASH EATING PLAN? For the DASH eating plan, you will follow these general guidelines:  Choose foods with a percent daily value for sodium of less than 5% (as listed on the food label).  Use salt-free seasonings or herbs instead of table salt or sea salt.  Check with your health care provider or pharmacist before using salt substitutes.  Eat lower-sodium products, often labeled as "lower sodium" or "no salt added."  Eat fresh foods.  Eat more vegetables, fruits, and low-fat dairy products.  Choose whole grains. Look for the word "whole" as the first word in the ingredient list.  Choose fish and skinless chicken or turkey more often than red meat. Limit fish, poultry, and meat to 6 oz (170 g) each day.  Limit sweets, desserts, sugars, and sugary drinks.  Choose heart-healthy fats.  Limit cheese to 1 oz (28 g) per day.  Eat more home-cooked food and less restaurant, buffet, and fast food.  Limit fried foods.  Cook foods using methods other than frying.  Limit canned vegetables. If you do use them, rinse them well to decrease the sodium.  When eating at a restaurant, ask that your food be prepared with less salt, or no salt if possible. WHAT FOODS CAN I EAT? Seek help from a dietitian for individual calorie needs. Grains Whole grain or whole wheat bread. Brown rice. Whole grain or whole wheat pasta. Quinoa, bulgur, and whole grain cereals. Low-sodium cereals. Corn or whole wheat flour tortillas. Whole grain cornbread. Whole grain crackers. Low-sodium crackers. Vegetables Fresh or frozen vegetables (raw, steamed, roasted, or grilled). Low-sodium or reduced-sodium tomato and vegetable juices. Low-sodium or reduced-sodium tomato sauce and paste. Low-sodium  or reduced-sodium canned vegetables.  Fruits All fresh, canned (in natural juice), or frozen fruits. Meat and Other Protein Products Ground beef (85% or leaner), grass-fed beef, or beef trimmed of fat. Skinless chicken or turkey. Ground chicken or turkey. Pork trimmed of fat. All fish and seafood. Eggs. Dried beans, peas, or lentils. Unsalted nuts and seeds. Unsalted canned beans. Dairy Low-fat dairy products, such as skim or 1% milk, 2% or reduced-fat cheeses, low-fat ricotta or cottage cheese, or plain low-fat yogurt. Low-sodium or reduced-sodium cheeses. Fats and Oils Tub margarines without trans fats. Light or reduced-fat mayonnaise and salad dressings (reduced sodium). Avocado. Safflower, olive, or canola oils. Natural peanut or almond butter. Other Unsalted popcorn and pretzels. The items listed above may not be a complete list of recommended foods or beverages. Contact your dietitian for more options. WHAT FOODS ARE NOT RECOMMENDED? Grains White bread. White pasta. White rice. Refined cornbread. Bagels and croissants. Crackers that contain trans fat. Vegetables Creamed or fried vegetables. Vegetables in a cheese sauce. Regular canned vegetables. Regular canned tomato sauce and paste. Regular tomato and vegetable juices. Fruits Dried fruits. Canned fruit in light or heavy syrup. Fruit juice. Meat and Other Protein Products Fatty cuts of meat. Ribs, chicken wings, bacon, sausage, bologna, salami, chitterlings, fatback, hot   dogs, bratwurst, and packaged luncheon meats. Salted nuts and seeds. Canned beans with salt. Dairy Whole or 2% milk, cream, half-and-half, and cream cheese. Whole-fat or sweetened yogurt. Full-fat cheeses or blue cheese. Nondairy creamers and whipped toppings. Processed cheese, cheese spreads, or cheese curds. Condiments Onion and garlic salt, seasoned salt, table salt, and sea salt. Canned and packaged gravies. Worcestershire sauce. Tartar sauce. Barbecue sauce.  Teriyaki sauce. Soy sauce, including reduced sodium. Steak sauce. Fish sauce. Oyster sauce. Cocktail sauce. Horseradish. Ketchup and mustard. Meat flavorings and tenderizers. Bouillon cubes. Hot sauce. Tabasco sauce. Marinades. Taco seasonings. Relishes. Fats and Oils Butter, stick margarine, lard, shortening, ghee, and bacon fat. Coconut, palm kernel, or palm oils. Regular salad dressings. Other Pickles and olives. Salted popcorn and pretzels. The items listed above may not be a complete list of foods and beverages to avoid. Contact your dietitian for more information. WHERE CAN I FIND MORE INFORMATION? National Heart, Lung, and Blood Institute: www.nhlbi.nih.gov/health/health-topics/topics/dash/ Document Released: 09/06/2011 Document Revised: 02/01/2014 Document Reviewed: 07/22/2013 ExitCare Patient Information 2015 [employer]. This information is not intended to replace advice given to you by your health care provider. Make sure you discuss any questions you have with your health care provider.  

## 2014-06-10 ENCOUNTER — Telehealth: Payer: Self-pay | Admitting: Emergency Medicine

## 2014-06-10 LAB — TSH: TSH: 1.945 u[IU]/mL (ref 0.350–4.500)

## 2014-06-10 LAB — COMPLETE METABOLIC PANEL WITH GFR
ALT: 18 U/L (ref 0–35)
AST: 18 U/L (ref 0–37)
Albumin: 3.2 g/dL — ABNORMAL LOW (ref 3.5–5.2)
Alkaline Phosphatase: 50 U/L (ref 39–117)
BILIRUBIN TOTAL: 0.4 mg/dL (ref 0.2–1.2)
BUN: 10 mg/dL (ref 6–23)
CO2: 26 meq/L (ref 19–32)
CREATININE: 1.02 mg/dL (ref 0.50–1.10)
Calcium: 9.2 mg/dL (ref 8.4–10.5)
Chloride: 106 mEq/L (ref 96–112)
GFR, EST AFRICAN AMERICAN: 84 mL/min
GFR, EST NON AFRICAN AMERICAN: 73 mL/min
GLUCOSE: 132 mg/dL — AB (ref 70–99)
Potassium: 3.9 mEq/L (ref 3.5–5.3)
SODIUM: 140 meq/L (ref 135–145)
TOTAL PROTEIN: 6.3 g/dL (ref 6.0–8.3)

## 2014-06-10 LAB — LIPID PANEL
CHOLESTEROL: 290 mg/dL — AB (ref 0–200)
HDL: 59 mg/dL (ref >39)
LDL Cholesterol: 211 mg/dL — ABNORMAL HIGH (ref 0–99)
Total CHOL/HDL Ratio: 4.9 Ratio
Triglycerides: 100 mg/dL (ref <150)
VLDL: 20 mg/dL (ref 0–40)

## 2014-06-10 LAB — VITAMIN D 25 HYDROXY (VIT D DEFICIENCY, FRACTURES): Vit D, 25-Hydroxy: 12 ng/mL — ABNORMAL LOW (ref 30–89)

## 2014-06-10 MED ORDER — VITAMIN D (ERGOCALCIFEROL) 1.25 MG (50000 UNIT) PO CAPS: 50000.0000 [IU] | ORAL_CAPSULE | ORAL | Status: DC

## 2014-06-10 NOTE — Telephone Encounter (Signed)
Message copied by Ricci Barker on Thu Jun 10, 2014  2:31 PM ------      Message from: Lorayne Marek      Created: Thu Jun 10, 2014 10:26 AM       Blood work reviewed, noticed low vitamin D, call patient advise to start ergocalciferol 50,000 units once a week for the duration of  12 weeks.      Also patient has borderline anemia advised patient to take over-the-counter iron supplements daily.      Her cholesterol is elevated, she was given the prescription for Lipitor advise patient to take the medication regularly, will repeat lipid panel on the following visit. ------

## 2014-06-10 NOTE — Telephone Encounter (Signed)
Pt given lab results with instructions to start low fat diet with exercise Medication ordered Vitamin D 50,000 units and e-scribed to [facility] pharmacy Pt instructed to continue taking prescribed cholesterol med with diet/exercise modification and we will recheck blood work with next scheduled appt.

## 2014-07-01 ENCOUNTER — Encounter: Payer: Self-pay | Attending: Nurse Practitioner | Admitting: *Deleted

## 2014-07-01 DIAGNOSIS — Z713 Dietary counseling and surveillance: Secondary | ICD-10-CM | POA: Insufficient documentation

## 2014-07-01 DIAGNOSIS — Z794 Long term (current) use of insulin: Secondary | ICD-10-CM | POA: Insufficient documentation

## 2014-07-01 DIAGNOSIS — E119 Type 2 diabetes mellitus without complications: Secondary | ICD-10-CM | POA: Insufficient documentation

## 2014-07-01 NOTE — Progress Notes (Signed)
Diabetes Self-Management Education  Visit Type:   DSME follow up  Appt. Start Time: 1100 Appt. End Time: 1200  07/01/2014  [name], identified by name and date of birth, is a 33 y.o. female with a diagnosis of Diabetes:  .  Other people present during visit:  Patient only  ASSESSMENT  Cathy Green was seen on 06/09/14 at [facility]. She has completed financial assessment for healthcare. She received a prescription for Lisinopril and Atorvastatin, Vitamin d and OTC iron. She has been unable to obtain these items due to lack of financial means. She has also run out of her insulin as of yesterday. I contacted the pharmacy at [facility] and spoke with [place]. As a result of  Our conversation, Cathy Green is leaving here and going to [facility] to obtain her medications, and whatever supplies may be available. She has also been directed to speak with Financial Coordinator in relationship to her status. I have encouraged her to discuss her depression with the provider next visit.  Subsequent Visit Information:  Since your last visit, have you continued or began the use of a meal plan?: No Since your last visit, have you continued or began to exercise on a consistent basis?: No Since your last visit have you continued or begun to take your medications as prescribed?: Yes (has run out of insulin as of yesterday. Has received her RX for Atorvastatin & Lisinopril that she is unable to obtain due to lack of funds. ) Since your last visit are you checking your feet?: No Since your last visit have you experienced any weight changes?: No change Since your last visit, are you checking your blood glucose at least once a day?: Yes  Psychosocial:    Patient Belief/Attitude about Diabetes: Other (comment) (Frustrated and depressed to due financial challenges) Self-care barriers: Lack of transportation;Lack of material resources Self-management support: Doctor's office;CDE  visits;Family Other persons present: Patient Special Needs: None Preferred Learning Style: No preference indicated  Complications:   Last HgB A1C per patient/outside source: 11 mg/dL How often do you check your blood sugar?: 1-2 times/day Fasting Blood glucose range (mg/dL): 70-129 Postprandial Blood glucose range (mg/dL):  (160-190mg /dl)   Exercise:  Exercise: ADL's  Individualized Plan for Diabetes Self-Management Training:   Learning Objective:  Patient will have a greater understanding of diabetes self-management.  Patient education plan per assessed needs and concerns is to attend individual sessions     Education Topics Reviewed with Patient Today:         Identified appropriate SMBG and/or A1C goals.;Taught/discussed recording of test results and interpretation of SMBG.     Role of stress on diabetes;Worked with patient to identify barriers to care and solutions     PATIENTS GOALS/Plan (Developed by the patient):  Medications: take my medication as prescribed Monitoring : test my blood glucose as discussed (note x per day with comment) (3X daily after meals) Health Coping: ask for help with (comment) (depression)    Plan:   Patient Instructions  Go to [facility] and obtain medications and speak with Museum/gallery curator. We aware of your dietary intake. We will focus on your medications resources at this time then work on dietary intake modifications.  Expected Outcomes:  Demonstrated interest in learning. Expect positive outcomes  If problems or questions, patient to contact team via:  Phone  Future DSME appointment: - 4-6 wks

## 2014-07-01 NOTE — Patient Instructions (Signed)
Go to [facility] and obtain medications and speak with Museum/gallery curator. We aware of your dietary intake. We will focus on your medications resources at this time then work on dietary intake modifications.

## 2014-07-06 ENCOUNTER — Other Ambulatory Visit: Payer: Self-pay

## 2014-07-07 ENCOUNTER — Telehealth: Payer: Self-pay | Admitting: Emergency Medicine

## 2014-07-07 ENCOUNTER — Ambulatory Visit: Payer: Self-pay | Attending: Internal Medicine

## 2014-07-07 NOTE — Telephone Encounter (Signed)
Message copied by Ricci Barker on Wed Jul 07, 2014  1:47 PM ------      Message from: Lorayne Marek      Created: Thu Jun 10, 2014 10:26 AM       Blood work reviewed, noticed low vitamin D, call patient advise to start ergocalciferol 50,000 units once a week for the duration of  12 weeks.      Also patient has borderline anemia advised patient to take over-the-counter iron supplements daily.      Her cholesterol is elevated, she was given the prescription for Lipitor advise patient to take the medication regularly, will repeat lipid panel on the following visit. ------

## 2014-07-09 ENCOUNTER — Other Ambulatory Visit: Payer: Self-pay

## 2014-07-09 MED ORDER — INSULIN NPH (HUMAN) (ISOPHANE) 100 UNIT/ML ~~LOC~~ SUSP: 24.0000 [IU] | Freq: Two times a day (BID) | SUBCUTANEOUS | Status: DC

## 2014-07-09 MED ORDER — INSULIN ASPART 100 UNIT/ML ~~LOC~~ SOLN: 0.0000 [IU] | Freq: Three times a day (TID) | SUBCUTANEOUS | Status: DC

## 2014-08-05 LAB — HM PAP SMEAR: HM Pap smear: NORMAL

## 2014-08-24 ENCOUNTER — Ambulatory Visit: Payer: Self-pay | Admitting: *Deleted

## 2015-05-12 ENCOUNTER — Encounter: Payer: Self-pay | Admitting: Internal Medicine

## 2015-05-12 ENCOUNTER — Ambulatory Visit: Payer: Self-pay | Attending: Internal Medicine | Admitting: Internal Medicine

## 2015-05-12 VITALS — BP 123/82 | HR 69 | Temp 98.0°F | Resp 16 | Ht 61.0 in | Wt 160.0 lb

## 2015-05-12 DIAGNOSIS — I129 Hypertensive chronic kidney disease with stage 1 through stage 4 chronic kidney disease, or unspecified chronic kidney disease: Secondary | ICD-10-CM | POA: Insufficient documentation

## 2015-05-12 DIAGNOSIS — Z23 Encounter for immunization: Secondary | ICD-10-CM

## 2015-05-12 DIAGNOSIS — Z7982 Long term (current) use of aspirin: Secondary | ICD-10-CM | POA: Insufficient documentation

## 2015-05-12 DIAGNOSIS — E785 Hyperlipidemia, unspecified: Secondary | ICD-10-CM | POA: Insufficient documentation

## 2015-05-12 DIAGNOSIS — H409 Unspecified glaucoma: Secondary | ICD-10-CM | POA: Insufficient documentation

## 2015-05-12 DIAGNOSIS — E119 Type 2 diabetes mellitus without complications: Secondary | ICD-10-CM

## 2015-05-12 DIAGNOSIS — N189 Chronic kidney disease, unspecified: Secondary | ICD-10-CM | POA: Insufficient documentation

## 2015-05-12 DIAGNOSIS — D649 Anemia, unspecified: Secondary | ICD-10-CM | POA: Insufficient documentation

## 2015-05-12 DIAGNOSIS — Z0000 Encounter for general adult medical examination without abnormal findings: Secondary | ICD-10-CM

## 2015-05-12 DIAGNOSIS — Z794 Long term (current) use of insulin: Secondary | ICD-10-CM | POA: Insufficient documentation

## 2015-05-12 DIAGNOSIS — Z79899 Other long term (current) drug therapy: Secondary | ICD-10-CM | POA: Insufficient documentation

## 2015-05-12 LAB — POCT GLYCOSYLATED HEMOGLOBIN (HGB A1C): Hemoglobin A1C: 11.4

## 2015-05-12 LAB — GLUCOSE, POCT (MANUAL RESULT ENTRY): POC Glucose: 173 mg/dl — AB (ref 70–99)

## 2015-05-12 NOTE — Progress Notes (Signed)
Patient here for a physical Patient will be starting a new job soon and will need form filled out As well as come back next week for placement of PPD Patient has no complaints at this time

## 2015-05-12 NOTE — Progress Notes (Signed)
Patient ID: Cathy Green, female   DOB: [birth year], 34 y.o.   MRN: [id number]  CC: physical  HPI: Cathy Green is a 34 y.o. 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  Patient has past medical history of Diabetes Mellitus diagnosed at age 89, HTN, HLD, anemia, and glaucoma. 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 She reports that she has been off all medication except insulin since April because she could not afford medication. Today her blood pressure is WNL. She has continued to check her blood sugars 4 times per day with ranges from 90-310. She states that she has to take at least 10 units of Novolog for meal coverage. She has been taking 30 units of Humulin twice per day.  She plans to start back taking medications after she starts working. Marland Kitchen  No Known Allergies Past Medical History  Diagnosis Date  . Abnormal Pap smear   . Diabetes mellitus     diagnosed age 58  . Hypertension   . Hyperlipidemia   . Chronic kidney disease   . Migraines   . Anemia   . Allergy     seasonal allergies  . Glaucoma    Current Outpatient Prescriptions on File Prior to Visit  Medication Sig Dispense Refill  . aspirin (ASPIRIN EC) 81 MG EC tablet Take 81 mg by mouth daily. Swallow whole.    . folic acid (FOLVITE) 1 MG tablet Take 1 mg by mouth daily.      . insulin aspart (NOVOLOG) 100 UNIT/ML injection Inject 0-10 Units into the skin 3 (three) times daily with meals. 30 mL 3  . insulin NPH Human (HUMULIN N,NOVOLIN N) 100 UNIT/ML injection Inject 0.24 mLs (24 Units total) into the skin 2 (two) times daily. 50 mL 3  . lisinopril (PRINIVIL,ZESTRIL) 10 MG tablet Take 1 tablet (10 mg total) by mouth daily. 30 tablet 3  . multivitamin (THERAGRAN) per tablet Take 1 tablet by mouth daily.      . Norgestimate-Ethinyl Estradiol Triphasic (ORTHO TRI-CYCLEN, 28,) 0.18/0.215/0.25 MG-35 MCG tablet Take 1 tablet by mouth daily.      . pravastatin  (PRAVACHOL) 40 MG tablet Take 40 mg by mouth daily.      . Vitamin D, Ergocalciferol, (DRISDOL) 50000 UNITS CAPS capsule Take 1 capsule (50,000 Units total) by mouth every 7 (seven) days. 12 capsule 0   No current facility-administered medications on file prior to visit.   Family History  Problem Relation Age of Onset  . Cancer Maternal Grandfather     prostate  . Hypertension Mother   . Stroke Mother   . Cancer Other   . COPD Other   . Hyperlipidemia Other   . Hypertension Father    Social History   Social History  . Marital Status: Single    Spouse Name: N/A  . Number of Children: N/A  . Years of Education: N/A   Occupational History  . Not on file.   Social History Main Topics  . Smoking status: Never Smoker   . Smokeless tobacco: Never Used  . Alcohol Use: Yes     Comment: rare  . Drug Use: Not on file  . Sexual Activity: No   Other Topics Concern  . Not on file   Social History Narrative    Review of Systems: Constitutional: Negative for fever, chills, diaphoresis, activity change, appetite change and fatigue. HENT: Negative for ear pain, nosebleeds, congestion, facial  swelling, rhinorrhea, neck pain, neck stiffness and ear discharge.  Eyes: Negative for pain, discharge, redness, itching and visual disturbance. Respiratory: Negative for cough, choking, chest tightness, shortness of breath, wheezing and stridor.  Cardiovascular: Negative for chest pain, palpitations and leg swelling. Gastrointestinal: Negative for abdominal distention. Genitourinary: Negative for dysuria, urgency, frequency, hematuria, flank pain, decreased urine volume, difficulty urinating and dyspareunia.  Musculoskeletal: Negative for back pain, joint swelling, arthralgias and gait problem. Neurological: Negative for dizziness, tremors, seizures, syncope, facial asymmetry, speech difficulty, weakness, light-headedness, numbness and headaches.  Hematological: Negative for adenopathy. Does not  bruise/bleed easily. Psychiatric/Behavioral: Negative for hallucinations, behavioral problems, confusion, dysphoric mood, decreased concentration and agitation.    Objective:   Filed Vitals:   05/12/15 1550  BP: 123/82  Pulse: 69  Temp: 98 F (36.7 C)  Resp: 16    Physical Exam: Constitutional: Patient appears well-developed and well-nourished. No distress. HENT: Normocephalic, atraumatic, External right and left ear normal. Oropharynx is clear and moist.  Eyes: Conjunctivae and EOM are normal. PERRLA, no scleral icterus. Neck: Normal ROM. Neck supple. No JVD. No tracheal deviation. No thyromegaly. CVS: RRR, S1/S2 +, no murmurs, no gallops, no carotid bruit.  Pulmonary: Effort and breath sounds normal, no stridor, rhonchi, wheezes, rales.  Abdominal: Soft. BS +,  no distension, tenderness, rebound or guarding.  Musculoskeletal: Normal range of motion. No edema and no tenderness.  Lymphadenopathy: No lymphadenopathy noted, cervical, inguinal or axillary Neuro: Alert. Normal reflexes, muscle tone coordination. No cranial nerve deficit. Skin: Skin is warm and dry. No rash noted. Not diaphoretic. No erythema. No pallor. Psychiatric: Normal mood and affect. Behavior, judgment, thought content normal.  Lab Results  Component Value Date   WBC 3.5* 06/09/2014   HGB 11.5* 06/09/2014   HCT 35.8* 06/09/2014   MCV 81.9 06/09/2014   PLT 302 06/09/2014   Lab Results  Component Value Date   CREATININE 1.02 06/09/2014   BUN 10 06/09/2014   NA 140 06/09/2014   K 3.9 06/09/2014   CL 106 06/09/2014   CO2 26 06/09/2014    Lab Results  Component Value Date   HGBA1C 11.40 05/12/2015   Lipid Panel     Component Value Date/Time   CHOL 290* 06/09/2014 1604   TRIG 100 06/09/2014 1604   HDL 59 06/09/2014 1604   CHOLHDL 4.9 06/09/2014 1604   VLDL 20 06/09/2014 1604   LDLCALC 211* 06/09/2014 1604       Assessment and plan:   Cathy Green was seen today for annual exam.  Diagnoses and  all orders for this visit:  Annual physical exam Forms completed during office visit  Type 2 diabetes mellitus without complication -     Glucose (CBG) -     HgB A1c -    Increased insulin NPH Human (HUMULIN N,NOVOLIN N) 100 UNIT/ML injection; Inject 0.33 mLs (33 Units total) into the skin 2 (two) times daily. I have asked patient to increase Humulin N to 33 units BID and bring log back for review with nurse. I have stressed the numerous diabetic complications that she could be facing if she continues to have high A1C. We went over diet and exercise and I stressed the need for change asap.   Need for Tdap vaccination -     Tdap vaccine greater than or equal to 7yo IM  Need for hepatitis B vaccination -     Hepatitis B vaccine adult IM   Return in about 3 weeks (around 06/02/2015) for Nurse Visit-log review  and 3 mo PCP .       Lance Bosch, [facility] [phone] 05/12/2015, 4:11 PM

## 2015-05-12 NOTE — Patient Instructions (Signed)
Increase Humalin N to 33 units twice per day. Keep same sliding scale. COme back in 3 weeks for blood sugar log review

## 2015-05-13 MED ORDER — INSULIN NPH (HUMAN) (ISOPHANE) 100 UNIT/ML ~~LOC~~ SUSP: 33.0000 [IU] | Freq: Two times a day (BID) | SUBCUTANEOUS | Status: DC

## 2015-05-18 ENCOUNTER — Ambulatory Visit: Payer: Self-pay | Attending: Family Medicine | Admitting: *Deleted

## 2015-05-18 DIAGNOSIS — Z111 Encounter for screening for respiratory tuberculosis: Secondary | ICD-10-CM | POA: Insufficient documentation

## 2015-05-18 DIAGNOSIS — Z0000 Encounter for general adult medical examination without abnormal findings: Secondary | ICD-10-CM

## 2015-05-18 NOTE — Progress Notes (Signed)
Patient presents for PPD placement for work Denies previous positive TB test  Denies known exposure to TB   Tuberculin skin test applied to left ventral forearm.  Patient aware that she needs to return in 48 hours for PPD reading

## 2015-05-20 ENCOUNTER — Ambulatory Visit: Payer: Self-pay | Attending: Internal Medicine | Admitting: *Deleted

## 2015-05-20 DIAGNOSIS — Z0000 Encounter for general adult medical examination without abnormal findings: Secondary | ICD-10-CM

## 2015-05-20 LAB — TB SKIN TEST
Induration: 0 mm
TB Skin Test: NEGATIVE

## 2015-05-20 NOTE — Progress Notes (Signed)
PPD Reading Note PPD read and results entered in Epic Result: 0 mm induration. Interpretation: Negative Allergic reaction: No Letter provided for employer

## 2015-10-14 LAB — HM DIABETES EYE EXAM

## 2015-10-18 ENCOUNTER — Ambulatory Visit: Payer: Self-pay | Attending: Internal Medicine | Admitting: Physician Assistant

## 2015-10-18 ENCOUNTER — Other Ambulatory Visit: Payer: Self-pay | Admitting: Internal Medicine

## 2015-10-18 VITALS — BP 131/81 | HR 101 | Temp 98.5°F | Resp 16 | Ht 61.0 in | Wt 155.0 lb

## 2015-10-18 DIAGNOSIS — Z7982 Long term (current) use of aspirin: Secondary | ICD-10-CM | POA: Insufficient documentation

## 2015-10-18 DIAGNOSIS — R197 Diarrhea, unspecified: Secondary | ICD-10-CM | POA: Insufficient documentation

## 2015-10-18 DIAGNOSIS — E785 Hyperlipidemia, unspecified: Secondary | ICD-10-CM | POA: Insufficient documentation

## 2015-10-18 DIAGNOSIS — Z794 Long term (current) use of insulin: Secondary | ICD-10-CM | POA: Insufficient documentation

## 2015-10-18 DIAGNOSIS — E119 Type 2 diabetes mellitus without complications: Secondary | ICD-10-CM

## 2015-10-18 DIAGNOSIS — R1110 Vomiting, unspecified: Secondary | ICD-10-CM | POA: Insufficient documentation

## 2015-10-18 DIAGNOSIS — Z9119 Patient's noncompliance with other medical treatment and regimen: Secondary | ICD-10-CM | POA: Insufficient documentation

## 2015-10-18 DIAGNOSIS — R002 Palpitations: Secondary | ICD-10-CM | POA: Insufficient documentation

## 2015-10-18 DIAGNOSIS — R200 Anesthesia of skin: Secondary | ICD-10-CM | POA: Insufficient documentation

## 2015-10-18 DIAGNOSIS — R112 Nausea with vomiting, unspecified: Secondary | ICD-10-CM

## 2015-10-18 DIAGNOSIS — I10 Essential (primary) hypertension: Secondary | ICD-10-CM | POA: Insufficient documentation

## 2015-10-18 DIAGNOSIS — E1165 Type 2 diabetes mellitus with hyperglycemia: Secondary | ICD-10-CM | POA: Insufficient documentation

## 2015-10-18 LAB — POCT URINALYSIS DIPSTICK
BILIRUBIN UA: NEGATIVE
GLUCOSE UA: 500
Ketones, UA: NEGATIVE
LEUKOCYTES UA: NEGATIVE
Nitrite, UA: NEGATIVE
Protein, UA: >=300
Spec Grav, UA: 1.02
Urobilinogen, UA: 0.2
pH, UA: 5.5

## 2015-10-18 LAB — CBC WITH DIFFERENTIAL/PLATELET
BASOS ABS: 0 10*3/uL (ref 0.0–0.1)
Basophils Relative: 0 % (ref 0–1)
EOS PCT: 0 % (ref 0–5)
Eosinophils Absolute: 0 10*3/uL (ref 0.0–0.7)
HCT: 32 % — ABNORMAL LOW (ref 36.0–46.0)
HEMOGLOBIN: 10 g/dL — AB (ref 12.0–15.0)
LYMPHS ABS: 2.1 10*3/uL (ref 0.7–4.0)
LYMPHS PCT: 14 % (ref 12–46)
MCH: 26.3 pg (ref 26.0–34.0)
MCHC: 31.3 g/dL (ref 30.0–36.0)
MCV: 84.2 fL (ref 78.0–100.0)
MPV: 12.4 fL (ref 8.6–12.4)
Monocytes Absolute: 0.9 10*3/uL (ref 0.1–1.0)
Monocytes Relative: 6 % (ref 3–12)
NEUTROS ABS: 12.2 10*3/uL — AB (ref 1.7–7.7)
NEUTROS PCT: 80 % — AB (ref 43–77)
Platelets: 260 10*3/uL (ref 150–400)
RBC: 3.8 MIL/uL — ABNORMAL LOW (ref 3.87–5.11)
RDW: 13.5 % (ref 11.5–15.5)
WBC: 15.3 10*3/uL — AB (ref 4.0–10.5)

## 2015-10-18 LAB — CMP AND LIVER
ALT: 11 U/L (ref 6–29)
AST: 11 U/L (ref 10–30)
Albumin: 3 g/dL — ABNORMAL LOW (ref 3.6–5.1)
Alkaline Phosphatase: 57 U/L (ref 33–115)
BILIRUBIN DIRECT: 0.1 mg/dL (ref <=0.2)
BILIRUBIN TOTAL: 0.5 mg/dL (ref 0.2–1.2)
BUN: 31 mg/dL — ABNORMAL HIGH (ref 7–25)
CO2: 21 mmol/L (ref 20–31)
CREATININE: 2.79 mg/dL — AB (ref 0.50–1.10)
Calcium: 8.1 mg/dL — ABNORMAL LOW (ref 8.6–10.2)
Chloride: 100 mmol/L (ref 98–110)
GLUCOSE: 502 mg/dL — AB (ref 65–99)
Indirect Bilirubin: 0.4 mg/dL (ref 0.2–1.2)
Potassium: 4.8 mmol/L (ref 3.5–5.3)
Sodium: 132 mmol/L — ABNORMAL LOW (ref 135–146)
TOTAL PROTEIN: 5.9 g/dL — AB (ref 6.1–8.1)

## 2015-10-18 LAB — GLUCOSE, POCT (MANUAL RESULT ENTRY)
POC Glucose: 541 mg/dl — AB (ref 70–99)
POC Glucose: 457 mg/dl — AB (ref 70–99)

## 2015-10-18 LAB — POCT GLYCOSYLATED HEMOGLOBIN (HGB A1C): HEMOGLOBIN A1C: 13.3

## 2015-10-18 LAB — POCT URINE PREGNANCY: Preg Test, Ur: NEGATIVE

## 2015-10-18 MED ORDER — GLUCOSE BLOOD VI STRP: ORAL_STRIP | Status: DC

## 2015-10-18 MED ORDER — INSULIN NPH (HUMAN) (ISOPHANE) 100 UNIT/ML ~~LOC~~ SUSP: 37.0000 [IU] | Freq: Two times a day (BID) | SUBCUTANEOUS | Status: DC

## 2015-10-18 MED ORDER — INSULIN ASPART 100 UNIT/ML ~~LOC~~ SOLN: 20.0000 [IU] | Freq: Once | SUBCUTANEOUS | Status: DC

## 2015-10-18 MED ORDER — TRUEPLUS LANCETS 28G MISC: 37.0000 [IU] | Freq: Four times a day (QID) | Status: DC

## 2015-10-18 MED ORDER — TRUE METRIX METER W/DEVICE KIT: 37.0000 [IU]/d | PACK | Freq: Four times a day (QID) | Status: DC

## 2015-10-18 MED FILL — TRUE METRIX TEST STRIP: 25 days supply | Qty: 100 | Fill #0

## 2015-10-18 MED FILL — !TRUE METRIX BLOOD GLUCOSE: 365 days supply | Qty: 1 | Fill #0

## 2015-10-18 MED FILL — NovoLIN N 100 UNIT/ML SUSP: 100 | 27 days supply | Qty: 20 | Fill #0

## 2015-10-18 MED FILL — TRUEplus LANCETS 28G MISC: 25 days supply | Qty: 100 | Fill #0

## 2015-10-18 NOTE — Patient Instructions (Signed)
Get the new meter and check your sugar 3 times with meals daily AND before you go to bed and keep a log Bring the log to your next appointment Pick up your new prescription. Your insulin dose is now 37 Units twice a day.

## 2015-10-18 NOTE — Progress Notes (Signed)
Patient c/o being in pain and vomiting.   Patient states that yesterday she had diarrhea and vomiting, but both sxs have subsided.  Patient feeling lighheaded and nausea, and some numbness in left leg.   Patient denies pain today.

## 2015-10-18 NOTE — Addendum Note (Signed)
Addended by: Dorothe Pea on: 10/18/2015 05:17 PM   Modules accepted: Orders

## 2015-10-18 NOTE — Addendum Note (Signed)
Addended by: Wonda Olds L on: 10/18/2015 04:12 PM   Modules accepted: Orders

## 2015-10-18 NOTE — Addendum Note (Signed)
Addended byBrayton Caves on: 10/18/2015 03:54 PM   Modules accepted: Orders

## 2015-10-18 NOTE — Progress Notes (Signed)
Chief Complaint: Vomiting and diarrhea  Subjective: This is a pleasant 35 year old female with a history of insulin-dependent diabetes mellitus, hypertension, and hyperlipidemia. She also can be noncompliant. She is presenting with nausea, vomiting and diarrhea that started on yesterday. She states that the nausea went from 8 AM to around midnight. The vomiting did as well. The vomiting was described as mostly like at first then later clearing and later a little blood-tinged.  The diarrhea went from 8 AM to 6 PM. She had greater than 10 stools. These have all settled down as of today. She's been a little bit lightheaded and nauseous today she feels a little weak. She has a few palpitations. She has been trying to force liquids upon herself as well as Powerade.  She is a insulin dependent diabetic and has not been seen here since August of last year. She has a little numbness in the left leg.  We contacted the pharmacy and found that she had not refilled her medications since September. Especially her insulin. She states she's been given it from a friend. Her glucometer is broken. She has not checked her sugar and at least a week.   ROS:  GEN: denies fever or chills, denies change in weight Skin: denies lesions or rashes HEENT: denies headache, earache, epistaxis, sore throat, or neck pain +l;ight headedness LUNGS: denies SHOB, dyspnea, PND, orthopnea CV: denies CP or palpitations ABD: + abd pain, +N + V, +Diarrhea EXT: denies muscle spasms or swelling; no pain in lower ext, no weakness NEURO: + numbness or tingling, denies sz, stroke or TIA   Objective:  Filed Vitals:   10/18/15 1408  BP: 131/81  Pulse: 101  Temp: 98.5 F (36.9 C)  TempSrc: Oral  Resp: 16  Height: 5\' 1"  (1.549 m)  Weight: 155 lb (70.308 kg)  SpO2: 100%    Physical Exam:  General: in no acute distress. Heart: Normal  s1 &s2  Regular rate and rhythm, without murmurs, rubs, gallops. Lungs: Clear to  auscultation bilaterally. Abdomen: Soft,  Mild diffuse tenderness, nondistended, positive bowel sounds. Extremities: No clubbing cyanosis or edema with positive pedal pulses. Neuro: Alert, awake, oriented x3, nonfocal.  Pertinent Lab Results: CBG = 41. A1c equals 13. Urine dipstick negative for ketones or glucose. Urine pregnancy test negative.   Medications: Prior to Admission medications   Medication Sig Start Date End Date Taking? Authorizing Provider  insulin aspart (NOVOLOG) 100 UNIT/ML injection Inject 0-10 Units into the skin 3 (three) times daily with meals. 07/09/14  Yes Deepak Advani, MD  insulin NPH Human (HUMULIN N,NOVOLIN N) 100 UNIT/ML injection Inject 0.33 mLs (33 Units total) into the skin 2 (two) times daily. 05/13/15  Yes Lance Bosch, NP  aspirin (ASPIRIN EC) 81 MG EC tablet Take 81 mg by mouth daily. Reported on 10/18/2015    Historical Provider, MD  folic acid (FOLVITE) 1 MG tablet Take 1 mg by mouth daily. Reported on 10/18/2015    Historical Provider, MD  lisinopril (PRINIVIL,ZESTRIL) 10 MG tablet Take 1 tablet (10 mg total) by mouth daily. Patient not taking: Reported on 10/18/2015 06/09/14   Lorayne Marek, MD  multivitamin [facility]) per tablet Take 1 tablet by mouth daily. Reported on 10/18/2015    Historical Provider, MD  Norgestimate-Ethinyl Estradiol Triphasic (ORTHO TRI-CYCLEN, 28,) 0.18/0.215/0.25 MG-35 MCG tablet Take 1 tablet by mouth daily. Reported on 10/18/2015    Historical Provider, MD  pravastatin (PRAVACHOL) 40 MG tablet Take 40 mg by mouth daily. Reported on 10/18/2015  Historical Provider, MD  Vitamin D, Ergocalciferol, (DRISDOL) 50000 UNITS CAPS capsule Take 1 capsule (50,000 Units total) by mouth every 7 (seven) days. Patient not taking: Reported on 10/18/2015 06/10/14   Lorayne Marek, MD    Assessment/Plan: 1. IDDM, uncontrolled, noncompliant with hyperglycemia  -POCT as above  -IVFs  -20 Units  -Increase NPH 37 U BID  -keep a log, new meter  provided  -CBC, CMP  -return in 1 week 2. Numbness LLE, likely diabetic neuropathy  -declines additional meds at this time   Follow up:1 week  >55 minutes of time spent with patient!  The patient was given clear instructions to go to ER or return to medical center if symptoms don't improve, worsen or new problems develop. The patient verbalized understanding. The patient was told to call to get lab results if they haven't heard anything in the next week.   This note has been created with Surveyor, quantity. Any transcriptional errors are unintentional.   Zettie Pho, PA-C 10/18/2015, 3:10 PM

## 2015-10-18 NOTE — Addendum Note (Signed)
Addended by: [employer] on: 10/18/2015 03:43 PM   Modules accepted: Orders

## 2015-10-19 ENCOUNTER — Encounter (HOSPITAL_COMMUNITY): Payer: Self-pay

## 2015-10-19 ENCOUNTER — Inpatient Hospital Stay (HOSPITAL_COMMUNITY)
Admission: EM | Admit: 2015-10-19 | Discharge: 2015-10-23 | DRG: 684 | Disposition: A | Discharge status: Home / Self Care | Payer: Self-pay | Attending: Internal Medicine | Admitting: Internal Medicine

## 2015-10-19 ENCOUNTER — Other Ambulatory Visit: Payer: Self-pay | Admitting: Internal Medicine

## 2015-10-19 ENCOUNTER — Telehealth: Payer: Self-pay

## 2015-10-19 DIAGNOSIS — Z794 Long term (current) use of insulin: Principal | ICD-10-CM

## 2015-10-19 DIAGNOSIS — N189 Chronic kidney disease, unspecified: Secondary | ICD-10-CM | POA: Diagnosis present

## 2015-10-19 DIAGNOSIS — E876 Hypokalemia: Secondary | ICD-10-CM | POA: Diagnosis not present

## 2015-10-19 DIAGNOSIS — E1029 Type 1 diabetes mellitus with other diabetic kidney complication: Secondary | ICD-10-CM | POA: Diagnosis present

## 2015-10-19 DIAGNOSIS — R112 Nausea with vomiting, unspecified: Secondary | ICD-10-CM | POA: Diagnosis present

## 2015-10-19 DIAGNOSIS — E1022 Type 1 diabetes mellitus with diabetic chronic kidney disease: Secondary | ICD-10-CM | POA: Diagnosis present

## 2015-10-19 DIAGNOSIS — E860 Dehydration: Secondary | ICD-10-CM | POA: Diagnosis present

## 2015-10-19 DIAGNOSIS — D649 Anemia, unspecified: Secondary | ICD-10-CM | POA: Diagnosis present

## 2015-10-19 DIAGNOSIS — E861 Hypovolemia: Secondary | ICD-10-CM | POA: Diagnosis present

## 2015-10-19 DIAGNOSIS — Z9119 Patient's noncompliance with other medical treatment and regimen: Secondary | ICD-10-CM

## 2015-10-19 DIAGNOSIS — N181 Chronic kidney disease, stage 1: Secondary | ICD-10-CM

## 2015-10-19 DIAGNOSIS — E119 Type 2 diabetes mellitus without complications: Secondary | ICD-10-CM

## 2015-10-19 DIAGNOSIS — R197 Diarrhea, unspecified: Secondary | ICD-10-CM

## 2015-10-19 DIAGNOSIS — I10 Essential (primary) hypertension: Secondary | ICD-10-CM | POA: Diagnosis present

## 2015-10-19 DIAGNOSIS — K529 Noninfective gastroenteritis and colitis, unspecified: Secondary | ICD-10-CM | POA: Diagnosis present

## 2015-10-19 DIAGNOSIS — N179 Acute kidney failure, unspecified: Principal | ICD-10-CM | POA: Diagnosis present

## 2015-10-19 DIAGNOSIS — I129 Hypertensive chronic kidney disease with stage 1 through stage 4 chronic kidney disease, or unspecified chronic kidney disease: Secondary | ICD-10-CM | POA: Diagnosis present

## 2015-10-19 DIAGNOSIS — E1065 Type 1 diabetes mellitus with hyperglycemia: Secondary | ICD-10-CM

## 2015-10-19 DIAGNOSIS — D72829 Elevated white blood cell count, unspecified: Secondary | ICD-10-CM | POA: Diagnosis present

## 2015-10-19 DIAGNOSIS — N289 Disorder of kidney and ureter, unspecified: Secondary | ICD-10-CM

## 2015-10-19 DIAGNOSIS — IMO0002 Reserved for concepts with insufficient information to code with codable children: Secondary | ICD-10-CM | POA: Diagnosis present

## 2015-10-19 DIAGNOSIS — H409 Unspecified glaucoma: Secondary | ICD-10-CM | POA: Diagnosis present

## 2015-10-19 DIAGNOSIS — E785 Hyperlipidemia, unspecified: Secondary | ICD-10-CM | POA: Diagnosis present

## 2015-10-19 HISTORY — DX: Patient's noncompliance with other medical treatment and regimen: Z91.19

## 2015-10-19 HISTORY — DX: Anesthesia of skin: R20.0

## 2015-10-19 HISTORY — DX: Patient's noncompliance with other medical treatment and regimen due to unspecified reason: Z91.199

## 2015-10-19 LAB — CBC
HEMATOCRIT: 34.3 % — AB (ref 36.0–46.0)
Hemoglobin: 11.4 g/dL — ABNORMAL LOW (ref 12.0–15.0)
MCH: 26.6 pg (ref 26.0–34.0)
MCHC: 33.2 g/dL (ref 30.0–36.0)
MCV: 80.1 fL (ref 78.0–100.0)
PLATELETS: 278 10*3/uL (ref 150–400)
RBC: 4.28 MIL/uL (ref 3.87–5.11)
RDW: 12.8 % (ref 11.5–15.5)
WBC: 14.4 10*3/uL — AB (ref 4.0–10.5)

## 2015-10-19 LAB — COMPREHENSIVE METABOLIC PANEL
ALT: 16 U/L (ref 14–54)
AST: 17 U/L (ref 15–41)
Albumin: 3 g/dL — ABNORMAL LOW (ref 3.5–5.0)
Alkaline Phosphatase: 71 U/L (ref 38–126)
Anion gap: 15 (ref 5–15)
BUN: 25 mg/dL — ABNORMAL HIGH (ref 6–20)
CHLORIDE: 101 mmol/L (ref 101–111)
CO2: 19 mmol/L — ABNORMAL LOW (ref 22–32)
Calcium: 8.9 mg/dL (ref 8.9–10.3)
Creatinine, Ser: 2.38 mg/dL — ABNORMAL HIGH (ref 0.44–1.00)
GFR, EST AFRICAN AMERICAN: 30 mL/min — AB (ref >60)
GFR, EST NON AFRICAN AMERICAN: 25 mL/min — AB (ref >60)
Glucose, Bld: 369 mg/dL — ABNORMAL HIGH (ref 65–99)
POTASSIUM: 4.1 mmol/L (ref 3.5–5.1)
Sodium: 135 mmol/L (ref 135–145)
TOTAL PROTEIN: 7.2 g/dL (ref 6.5–8.1)
Total Bilirubin: 0.9 mg/dL (ref 0.3–1.2)

## 2015-10-19 LAB — URINE MICROSCOPIC-ADD ON

## 2015-10-19 LAB — URINALYSIS, ROUTINE W REFLEX MICROSCOPIC
Bilirubin Urine: NEGATIVE
Ketones, ur: 15 mg/dL — AB
LEUKOCYTES UA: NEGATIVE
NITRITE: NEGATIVE
PH: 6 (ref 5.0–8.0)
Specific Gravity, Urine: 1.023 (ref 1.005–1.030)

## 2015-10-19 LAB — BASIC METABOLIC PANEL
ANION GAP: 13 (ref 5–15)
BUN: 27 mg/dL — AB (ref 6–20)
CHLORIDE: 107 mmol/L (ref 101–111)
CO2: 19 mmol/L — AB (ref 22–32)
Calcium: 8 mg/dL — ABNORMAL LOW (ref 8.9–10.3)
Creatinine, Ser: 2.12 mg/dL — ABNORMAL HIGH (ref 0.44–1.00)
GFR calc Af Amer: 34 mL/min — ABNORMAL LOW (ref >60)
GFR, EST NON AFRICAN AMERICAN: 29 mL/min — AB (ref >60)
GLUCOSE: 365 mg/dL — AB (ref 65–99)
POTASSIUM: 4.1 mmol/L (ref 3.5–5.1)
Sodium: 139 mmol/L (ref 135–145)

## 2015-10-19 LAB — LACTIC ACID, PLASMA: LACTIC ACID, VENOUS: 1.6 mmol/L (ref 0.5–2.0)

## 2015-10-19 LAB — PREGNANCY, URINE: Preg Test, Ur: NEGATIVE

## 2015-10-19 LAB — LIPASE, BLOOD: LIPASE: 31 U/L (ref 11–51)

## 2015-10-19 MED ORDER — ONDANSETRON 4 MG PO TBDP
ORAL_TABLET | ORAL | Status: AC
  Filled 2015-10-19: qty 1

## 2015-10-19 MED ORDER — ONDANSETRON 4 MG PO TBDP
4.0000 mg | ORAL_TABLET | Freq: Once | ORAL | Status: AC | PRN
  Administered 2015-10-19: 4 mg via ORAL

## 2015-10-19 MED ORDER — SODIUM CHLORIDE 0.9 % IV BOLUS (SEPSIS)
1000.0000 mL | Freq: Once | INTRAVENOUS | Status: AC
  Administered 2015-10-19: 1000 mL via INTRAVENOUS

## 2015-10-19 MED ORDER — INSULIN NPH (HUMAN) (ISOPHANE) 100 UNIT/ML ~~LOC~~ SUSP: 37.0000 [IU] | Freq: Two times a day (BID) | SUBCUTANEOUS | Status: DC

## 2015-10-19 MED ORDER — INSULIN NPH (HUMAN) (ISOPHANE) 100 UNIT/ML ~~LOC~~ SUSP
37.0000 [IU] | Freq: Two times a day (BID) | SUBCUTANEOUS | Status: DC
  Administered 2015-10-20 (×2): 37 [IU] via SUBCUTANEOUS
  Filled 2015-10-19: qty 10

## 2015-10-19 MED ORDER — ONDANSETRON HCL 4 MG/2ML IJ SOLN
4.0000 mg | INTRAMUSCULAR | Status: AC | PRN
  Administered 2015-10-19 – 2015-10-20 (×2): 4 mg via INTRAVENOUS
  Filled 2015-10-19 (×2): qty 2

## 2015-10-19 MED ORDER — FAMOTIDINE IN NACL 20-0.9 MG/50ML-% IV SOLN
20.0000 mg | Freq: Once | INTRAVENOUS | Status: AC
  Administered 2015-10-19: 20 mg via INTRAVENOUS
  Filled 2015-10-19: qty 50

## 2015-10-19 MED ORDER — SODIUM CHLORIDE 0.9 % IV SOLN
INTRAVENOUS | Status: DC
  Administered 2015-10-19: via INTRAVENOUS

## 2015-10-19 NOTE — ED Provider Notes (Signed)
CSN: [id number]     Arrival date & time 10/19/15  1444 History   First MD Initiated Contact with Patient 10/19/15 1959     Chief Complaint  Patient presents with  . Emesis      HPI  Pt was seen at 2000. Per pt, c/o gradual onset and persistence of multiple intermittent episodes of N/V/D that began 2 days ago.   States her diarrhea has improved today. Has been unable to take PO due to N/V. Denies abd pain, no CP/SOB, no back pain, no fevers, no black or blood in stools or emesis. Pt was evaluated by her PMD yesterday and received IVF. Pt states she is not improved today, so she was told to come to the ED for further evaluation.    Past Medical History  Diagnosis Date  . Abnormal Pap smear   . Diabetes mellitus     diagnosed age 24  . Hypertension   . Hyperlipidemia   . Chronic kidney disease   . Migraines   . Anemia   . Allergy     seasonal allergies  . Glaucoma   . Noncompliance   . Numbness in left leg    Past Surgical History  Procedure Laterality Date  . Wisdom tooth extraction     Family History  Problem Relation Age of Onset  . Cancer Maternal Grandfather     prostate  . Hypertension Mother   . Stroke Mother   . Cancer Other   . COPD Other   . Hyperlipidemia Other   . Hypertension Father    Social History  Substance Use Topics  . Smoking status: Never Smoker   . Smokeless tobacco: Never Used  . Alcohol Use: Yes     Comment: rare    Review of Systems ROS: Statement: All systems negative except as marked or noted in the HPI; Constitutional: Negative for fever and chills. ; ; Eyes: Negative for eye pain, redness and discharge. ; ; ENMT: Negative for ear pain, hoarseness, nasal congestion, sinus pressure and sore throat. ; ; Cardiovascular: Negative for chest pain, palpitations, diaphoresis, dyspnea and peripheral edema. ; ; Respiratory: Negative for cough, wheezing and stridor. ; ; Gastrointestinal: +N/V/D. Negative for abdominal pain, blood in stool,  hematemesis, jaundice and rectal bleeding. . ; ; Genitourinary: Negative for dysuria, flank pain and hematuria. ; ; Musculoskeletal: Negative for back pain and neck pain. Negative for swelling and trauma.; ; Skin: Negative for pruritus, rash, abrasions, blisters, bruising and skin lesion.; ; Neuro: Negative for headache, lightheadedness and neck stiffness. Negative for weakness, altered level of consciousness , altered mental status, extremity weakness, paresthesias, involuntary movement, seizure and syncope.      Allergies  Review of patient's allergies indicates no known allergies.  Home Medications   Prior to Admission medications   Medication Sig Start Date End Date Taking? Authorizing Provider  Blood Glucose Monitoring Suppl (TRUE METRIX METER) w/Device KIT 37 Units/day by Does not apply route 4 (four) times daily. 10/18/15  Yes Tiffany Daneil Dan, PA-C  glucose blood (TRUE METRIX BLOOD GLUCOSE TEST) test strip Use as instructed 10/18/15  Yes Tiffany Daneil Dan, PA-C  insulin aspart (NOVOLOG) 100 UNIT/ML injection Inject 0-10 Units into the skin 3 (three) times daily with meals. 07/09/14  Yes Deepak Advani, MD  insulin NPH Human (HUMULIN N,NOVOLIN N) 100 UNIT/ML injection Inject 0.37 mLs (37 Units total) into the skin 2 (two) times daily. 10/19/15  Yes Tresa Garter, MD  TRUEPLUS LANCETS 28G MISC 37  Units by Does not apply route 4 (four) times daily. 10/18/15  Yes Tiffany Daneil Dan, PA-C  lisinopril (PRINIVIL,ZESTRIL) 10 MG tablet Take 1 tablet (10 mg total) by mouth daily. Patient not taking: Reported on 10/18/2015 06/09/14   Lorayne Marek, MD  Vitamin D, Ergocalciferol, (DRISDOL) 50000 UNITS CAPS capsule Take 1 capsule (50,000 Units total) by mouth every 7 (seven) days. Patient not taking: Reported on 10/18/2015 06/10/14   Lorayne Marek, MD   BP 178/105 mmHg  Pulse 99  Temp(Src) 99.2 F (37.3 C) (Oral)  Resp 18  SpO2 99%  LMP 10/13/2015   Filed Vitals:   10/19/15 1936 10/19/15 2000 10/19/15  2015 10/19/15 2030  BP:  177/102 179/99 178/105  Pulse:  101 104 99  Temp:      TempSrc:      Resp:      SpO2: 100% 100% 100% 99%    Physical Exam  2005: Physical examination:  Nursing notes reviewed; Vital signs and O2 SAT reviewed;  Constitutional: Well developed, Well nourished, In no acute distress; Head:  Normocephalic, atraumatic; Eyes: EOMI, PERRL, No scleral icterus; ENMT: Mouth and pharynx normal, Mucous membranes dry; Neck: Supple, Full range of motion, No lymphadenopathy; Cardiovascular: Regular rate and rhythm, No murmur, rub, or gallop; Respiratory: Breath sounds clear & equal bilaterally, No rales, rhonchi, wheezes.  Speaking full sentences with ease, Normal respiratory effort/excursion; Chest: Nontender, Movement normal; Abdomen: Soft, Nontender, Nondistended, Normal bowel sounds; Genitourinary: No CVA tenderness; Extremities: Pulses normal, No tenderness, No edema, No calf edema or asymmetry.; Neuro: AA&Ox3, Major CN grossly intact.  Speech clear. No gross focal motor or sensory deficits in extremities.; Skin: Color normal, Warm, Dry.   ED Course  Procedures (including critical care time) Labs Review   Imaging Review  I have personally reviewed and evaluated these images and lab results as part of my medical decision-making.   EKG Interpretation None      MDM  MDM Reviewed: previous chart, nursing note and vitals Reviewed previous: labs Interpretation: labs   Results for orders placed or performed during the hospital encounter of 10/19/15  Lipase, blood  Result Value Ref Range   Lipase 31 11 - 51 U/L  Comprehensive metabolic panel  Result Value Ref Range   Sodium 135 135 - 145 mmol/L   Potassium 4.1 3.5 - 5.1 mmol/L   Chloride 101 101 - 111 mmol/L   CO2 19 (L) 22 - 32 mmol/L   Glucose, Bld 369 (H) 65 - 99 mg/dL   BUN 25 (H) 6 - 20 mg/dL   Creatinine, Ser 2.38 (H) 0.44 - 1.00 mg/dL   Calcium 8.9 8.9 - 10.3 mg/dL   Total Protein 7.2 6.5 - 8.1 g/dL    Albumin 3.0 (L) 3.5 - 5.0 g/dL   AST 17 15 - 41 U/L   ALT 16 14 - 54 U/L   Alkaline Phosphatase 71 38 - 126 U/L   Total Bilirubin 0.9 0.3 - 1.2 mg/dL   GFR calc non Af Amer 25 (L) >60 mL/min   GFR calc Af Amer 30 (L) >60 mL/min   Anion gap 15 5 - 15  CBC  Result Value Ref Range   WBC 14.4 (H) 4.0 - 10.5 K/uL   RBC 4.28 3.87 - 5.11 MIL/uL   Hemoglobin 11.4 (L) 12.0 - 15.0 g/dL   HCT 34.3 (L) 36.0 - 46.0 %   MCV 80.1 78.0 - 100.0 fL   MCH 26.6 26.0 - 34.0 pg   MCHC 33.2 30.0 -  36.0 g/dL   RDW 12.8 11.5 - 15.5 %   Platelets 278 150 - 400 K/uL  Urinalysis, Routine w reflex microscopic (not at [facility])  Result Value Ref Range   Color, Urine YELLOW YELLOW   APPearance CLEAR CLEAR   Specific Gravity, Urine 1.023 1.005 - 1.030   pH 6.0 5.0 - 8.0   Glucose, UA >1000 (A) NEGATIVE mg/dL   Hgb urine dipstick MODERATE (A) NEGATIVE   Bilirubin Urine NEGATIVE NEGATIVE   Ketones, ur 15 (A) NEGATIVE mg/dL   Protein, ur >300 (A) NEGATIVE mg/dL   Nitrite NEGATIVE NEGATIVE   Leukocytes, UA NEGATIVE NEGATIVE  Urine microscopic-add on  Result Value Ref Range   Squamous Epithelial / LPF 0-5 (A) NONE SEEN   WBC, UA 0-5 0 - 5 WBC/hpf   RBC / HPF 6-30 0 - 5 RBC/hpf   Bacteria, UA FEW (A) NONE SEEN   Casts GRANULAR CAST (A) NEGATIVE  Pregnancy, urine  Result Value Ref Range   Preg Test, Ur NEGATIVE NEGATIVE      Results for QUANETTA, TRUSS (MRN [id number]) as of 10/19/2015 20:55  Ref. Range 02/16/2010 20:43 07/31/2010 22:05 09/07/2011 15:00 06/09/2014 16:04 10/18/2015 15:26 10/19/2015 15:57  BUN Latest Ref Range: 6-20 mg/dL 12 4 (L) '9 10 31 ' (H) 25 (H)  Creatinine Latest Ref Range: 0.44-1.00 mg/dL 0.71 0.69 0.82 1.02 2.79 (H) 2.38 (H)    2100:  Hyperglycemia per hx, AG 15. BUN/Cr continues elevated from baseline since yesterday, despite IVF in PMD ofc. IVF boluses given. Dx and testing d/w pt and family.  Questions answered.  Verb understanding, agreeable to observation admit.  T/C to [facility]. Roel Cluck,  case discussed, including:  HPI, pertinent PM/SHx, VS/PE, dx testing, ED course and treatment:  Agreeable to admit, requests to obtain lactic acid, write temporary orders, obtain medical bed to team MCAdmits.   Francine Graven, DO 10/22/15 2333

## 2015-10-19 NOTE — ED Notes (Signed)
Pt here for nausea and vomiting. She states she has been sick since Monday and has vomited multiple times today. Pt reports generalized abdominal pain as well. Denies diarrhea.

## 2015-10-19 NOTE — Telephone Encounter (Signed)
Spoke with allison from [employer] She had called with a critical lab value for glucose Of 502 Reported off to [employer] supervisor

## 2015-10-19 NOTE — ED Notes (Signed)
CBG was 304

## 2015-10-19 NOTE — H&P (Signed)
PCP:  Lance Bosch, NP [place]   Referring provider Thurnell Garbe   Chief Complaint:   Nausea vomiting diarrhea HPI: Cathy Green is a 35 y.o. female   has a past medical history of Abnormal Pap smear; Diabetes mellitus; Hypertension; Hyperlipidemia; Chronic kidney disease; Migraines; Anemia; Allergy; Glaucoma; Noncompliance; and Numbness in left leg.   Presented with  She was seen yesterday by her primary care provider today with 2 days history of nausea vomiting and diarrhea is been feeling lightheaded and having trouble eating.given IV fluids in the office. She was reporting having more than 10 stools a day. Her diarrhea has finally improved.  Records patient have had some troubles with 8 noncompliance yesterday her blood sugar was in  500 range but today recheck blood sugar was down to 300. Patient states her glucometer has been broken and she hasn't been using it. Reports not using her insulin because she was too weak to get it.  Denies have any blood or black stools. No blood in the vomiting. No coffee ground emesis. In emergency department UA showed some ketones. Labs showing non-gap acidosis patient has had worsening renal function with creatinine  Up to 2.38  Hospitalist was called for admission for dehydration acute renal failure possibly secondary gastroenteritis  Review of Systems:    Pertinent positives include: abdominal pain, nausea, vomiting, diarrhea  Constitutional:  No weight loss, night sweats, Fevers, chills, fatigue, weight loss  HEENT:  No headaches, Difficulty swallowing,Tooth/dental problems,Sore throat,  No sneezing, itching, ear ache, nasal congestion, post nasal drip,  Cardio-vascular:  No chest pain, Orthopnea, PND, anasarca, dizziness, palpitations.no Bilateral lower extremity swelling  GI:  No heartburn, indigestion, , change in bowel habits, loss of appetite, melena, blood in stool, hematemesis Resp:  no  shortness of breath at rest. No dyspnea on exertion, No excess mucus, no productive cough, No non-productive cough, No coughing up of blood.No change in color of mucus.No wheezing. Skin:  no rash or lesions. No jaundice GU:  no dysuria, change in color of urine, no urgency or frequency. No straining to urinate.  No flank pain.  Musculoskeletal:  No joint pain or no joint swelling. No decreased range of motion. No back pain.  Psych:  No change in mood or affect. No depression or anxiety. No memory loss.  Neuro: no localizing neurological complaints, no tingling, no weakness, no double vision, no gait abnormality, no slurred speech, no confusion  Otherwise ROS are negative except for above, 10 systems were reviewed  Past Medical History: Past Medical History  Diagnosis Date  . Abnormal Pap smear   . Diabetes mellitus     diagnosed age 23  . Hypertension   . Hyperlipidemia   . Chronic kidney disease   . Migraines   . Anemia   . Allergy     seasonal allergies  . Glaucoma   . Noncompliance   . Numbness in left leg    Past Surgical History  Procedure Laterality Date  . Wisdom tooth extraction       Medications: Prior to Admission medications   Medication Sig Start Date End Date Taking? Authorizing Provider  Blood Glucose Monitoring Suppl (TRUE METRIX METER) w/Device KIT 37 Units/day by Does not apply route 4 (four) times daily. 10/18/15  Yes Tiffany Daneil Dan, PA-C  glucose blood (TRUE METRIX BLOOD GLUCOSE TEST) test strip Use as instructed 10/18/15  Yes Tiffany Daneil Dan, PA-C  insulin aspart (NOVOLOG) 100 UNIT/ML injection Inject  0-10 Units into the skin 3 (three) times daily with meals. 07/09/14  Yes Deepak Advani, MD  insulin NPH Human (HUMULIN N,NOVOLIN N) 100 UNIT/ML injection Inject 0.37 mLs (37 Units total) into the skin 2 (two) times daily. 10/19/15  Yes Olugbemiga Essie Christine, MD  TRUEPLUS LANCETS 28G MISC 37 Units by Does not apply route 4 (four) times daily. 10/18/15  Yes Tiffany  Daneil Dan, PA-C  lisinopril (PRINIVIL,ZESTRIL) 10 MG tablet Take 1 tablet (10 mg total) by mouth daily. Patient not taking: Reported on 10/18/2015 06/09/14   Lorayne Marek, MD  Vitamin D, Ergocalciferol, (DRISDOL) 50000 UNITS CAPS capsule Take 1 capsule (50,000 Units total) by mouth every 7 (seven) days. Patient not taking: Reported on 10/18/2015 06/10/14   Lorayne Marek, MD    Allergies:  No Known Allergies  Social History:  Ambulatory  independently   Lives at home  With family     reports that she has never smoked. She has never used smokeless tobacco. She reports that she drinks alcohol. She reports that she does not use illicit drugs.     Family History: family history includes COPD in her other; Cancer in her maternal grandfather and other; Hyperlipidemia in her other; Hypertension in her father and mother; Stroke in her mother.    Physical Exam: Patient Vitals for the past 24 hrs:  BP Temp Temp src Pulse Resp SpO2  10/19/15 2030 (!) 178/105 mmHg - - 99 - 99 %  10/19/15 2015 179/99 mmHg - - 104 - 100 %  10/19/15 2000 (!) 177/102 mmHg - - 101 - 100 %  10/19/15 1936 - - - - - 100 %  10/19/15 1932 (!) 197/102 mmHg - - 95 - 100 %  10/19/15 1629 159/91 mmHg 99.2 F (37.3 C) Oral 100 18 100 %    1. General:  in No Acute distress 2. Psychological: Alert and   Oriented 3. Head/ENT:     Dry Mucous Membranes                          Head Non traumatic, neck supple                          Normal   Dentition 4. SKIN:  decreased Skin turgor,  Skin clean Dry and intact no rash 5. Heart: Regular rate and rhythm no Murmur, Rub or gallop 6. Lungs: Clear to auscultation bilaterally, no wheezes or crackles   7. Abdomen: Soft, non-tender, Non distended 8. Lower extremities: no clubbing, cyanosis, or edema 9. Neurologically Grossly intact, moving all 4 extremities equally 10. MSK: Normal range of motion  body mass index is unknown because there is no weight on file.   Labs on Admission:     Results for orders placed or performed during the hospital encounter of 10/19/15 (from the past 24 hour(s))  Urinalysis, Routine w reflex microscopic (not at [facility])     Status: Abnormal   Collection Time: 10/19/15  3:52 PM  Result Value Ref Range   Color, Urine YELLOW YELLOW   APPearance CLEAR CLEAR   Specific Gravity, Urine 1.023 1.005 - 1.030   pH 6.0 5.0 - 8.0   Glucose, UA >1000 (A) NEGATIVE mg/dL   Hgb urine dipstick MODERATE (A) NEGATIVE   Bilirubin Urine NEGATIVE NEGATIVE   Ketones, ur 15 (A) NEGATIVE mg/dL   Protein, ur >300 (A) NEGATIVE mg/dL   Nitrite NEGATIVE NEGATIVE  Leukocytes, UA NEGATIVE NEGATIVE  Urine microscopic-add on     Status: Abnormal   Collection Time: 10/19/15  3:52 PM  Result Value Ref Range   Squamous Epithelial / LPF 0-5 (A) NONE SEEN   WBC, UA 0-5 0 - 5 WBC/hpf   RBC / HPF 6-30 0 - 5 RBC/hpf   Bacteria, UA FEW (A) NONE SEEN   Casts GRANULAR CAST (A) NEGATIVE  Pregnancy, urine     Status: None   Collection Time: 10/19/15  3:52 PM  Result Value Ref Range   Preg Test, Ur NEGATIVE NEGATIVE  Lipase, blood     Status: None   Collection Time: 10/19/15  3:57 PM  Result Value Ref Range   Lipase 31 11 - 51 U/L  Comprehensive metabolic panel     Status: Abnormal   Collection Time: 10/19/15  3:57 PM  Result Value Ref Range   Sodium 135 135 - 145 mmol/L   Potassium 4.1 3.5 - 5.1 mmol/L   Chloride 101 101 - 111 mmol/L   CO2 19 (L) 22 - 32 mmol/L   Glucose, Bld 369 (H) 65 - 99 mg/dL   BUN 25 (H) 6 - 20 mg/dL   Creatinine, Ser 2.38 (H) 0.44 - 1.00 mg/dL   Calcium 8.9 8.9 - 10.3 mg/dL   Total Protein 7.2 6.5 - 8.1 g/dL   Albumin 3.0 (L) 3.5 - 5.0 g/dL   AST 17 15 - 41 U/L   ALT 16 14 - 54 U/L   Alkaline Phosphatase 71 38 - 126 U/L   Total Bilirubin 0.9 0.3 - 1.2 mg/dL   GFR calc non Af Amer 25 (L) >60 mL/min   GFR calc Af Amer 30 (L) >60 mL/min   Anion gap 15 5 - 15  CBC     Status: Abnormal   Collection Time: 10/19/15  3:57 PM  Result Value Ref  Range   WBC 14.4 (H) 4.0 - 10.5 K/uL   RBC 4.28 3.87 - 5.11 MIL/uL   Hemoglobin 11.4 (L) 12.0 - 15.0 g/dL   HCT 34.3 (L) 36.0 - 46.0 %   MCV 80.1 78.0 - 100.0 fL   MCH 26.6 26.0 - 34.0 pg   MCHC 33.2 30.0 - 36.0 g/dL   RDW 12.8 11.5 - 15.5 %   Platelets 278 150 - 400 K/uL    UA protein above 300 ketones 15 glucose above 1000  Lab Results  Component Value Date   HGBA1C 13.30 10/18/2015    Estimated Creatinine Clearance: 29.9 mL/min (by C-G formula based on Cr of 2.38).  BNP (last 3 results) No results for input(s): PROBNP in the last 8760 hours.  Other results:  I have pearsonaly reviewed this: ECG REPORT Not obtained   There were no vitals filed for this visit.   Cultures: No results found for: [place], [place], [place], REPTSTATUS   Radiological Exams on Admission: No results found.  Chart has been reviewed  Family not  at  Bedside    Assessment/Plan  34 year old female with history of poorly controlled type 1 diabetes due to poor compliance presents with dehydration and evidence of non-gap metabolic acidosis    and acute renal failure due to dehydration  Present on Admission:  . Dehydration - rehydrate aggressively and continue to monitor  . Anemia chronic currently stable  . Hypertension continue home medications hold lisinopril  . Nausea vomiting and diarrhea - will obtain stool cultures,   DM type 1 since age of 29 poorly controlled due  to noncompliance - restart home dose of insulin. At this point no evidence of anion gap acidosis which would be indicative of DKA. We'll rehydrate and continue to monitor closely. Renal failure could  explain metabolic acidosis  Prophylaxis: Lovenox   CODE STATUS:  FULL CODE  as per patient    Disposition:   To home once workup is complete and patient is stable  Other plan as per orders.  I have spent a total of 46mn on this admission  Alliya Marcon 10/20/2015, 12:10 AM    [facility]  Pager [phone]    after 2 AM please page floor coverage PA If 7AM-7PM, please contact the day team taking care of the patient  [email or web address]  Password [email or web address]

## 2015-10-19 NOTE — ED Notes (Signed)
Gave family a coke

## 2015-10-20 DIAGNOSIS — N179 Acute kidney failure, unspecified: Secondary | ICD-10-CM | POA: Diagnosis present

## 2015-10-20 LAB — PHOSPHORUS: PHOSPHORUS: 3 mg/dL (ref 2.5–4.6)

## 2015-10-20 LAB — GLUCOSE, CAPILLARY
GLUCOSE-CAPILLARY: 104 mg/dL — AB (ref 65–99)
GLUCOSE-CAPILLARY: 313 mg/dL — AB (ref 65–99)
GLUCOSE-CAPILLARY: 85 mg/dL (ref 65–99)
GLUCOSE-CAPILLARY: 90 mg/dL (ref 65–99)
Glucose-Capillary: 65 mg/dL (ref 65–99)
Glucose-Capillary: 92 mg/dL (ref 65–99)

## 2015-10-20 LAB — COMPREHENSIVE METABOLIC PANEL
ALBUMIN: 2.5 g/dL — AB (ref 3.5–5.0)
ALT: 12 U/L — AB (ref 14–54)
AST: 13 U/L — AB (ref 15–41)
Alkaline Phosphatase: 61 U/L (ref 38–126)
Anion gap: 9 (ref 5–15)
BUN: 28 mg/dL — AB (ref 6–20)
CHLORIDE: 112 mmol/L — AB (ref 101–111)
CO2: 22 mmol/L (ref 22–32)
CREATININE: 2.1 mg/dL — AB (ref 0.44–1.00)
Calcium: 8.1 mg/dL — ABNORMAL LOW (ref 8.9–10.3)
GFR calc Af Amer: 34 mL/min — ABNORMAL LOW (ref >60)
GFR calc non Af Amer: 30 mL/min — ABNORMAL LOW (ref >60)
GLUCOSE: 325 mg/dL — AB (ref 65–99)
POTASSIUM: 4.2 mmol/L (ref 3.5–5.1)
SODIUM: 143 mmol/L (ref 135–145)
Total Bilirubin: 0.8 mg/dL (ref 0.3–1.2)
Total Protein: 6 g/dL — ABNORMAL LOW (ref 6.5–8.1)

## 2015-10-20 LAB — CBC
HEMATOCRIT: 31.4 % — AB (ref 36.0–46.0)
Hemoglobin: 10.4 g/dL — ABNORMAL LOW (ref 12.0–15.0)
MCH: 26.5 pg (ref 26.0–34.0)
MCHC: 33.1 g/dL (ref 30.0–36.0)
MCV: 80.1 fL (ref 78.0–100.0)
PLATELETS: 243 10*3/uL (ref 150–400)
RBC: 3.92 MIL/uL (ref 3.87–5.11)
RDW: 12.9 % (ref 11.5–15.5)
WBC: 15 10*3/uL — ABNORMAL HIGH (ref 4.0–10.5)

## 2015-10-20 LAB — CBG MONITORING, ED: Glucose-Capillary: 304 mg/dL — ABNORMAL HIGH (ref 65–99)

## 2015-10-20 LAB — MAGNESIUM: Magnesium: 2.1 mg/dL (ref 1.7–2.4)

## 2015-10-20 LAB — TSH: TSH: 1.028 u[IU]/mL (ref 0.350–4.500)

## 2015-10-20 MED ORDER — INSULIN ASPART 100 UNIT/ML ~~LOC~~ SOLN
0.0000 [IU] | Freq: Three times a day (TID) | SUBCUTANEOUS | Status: DC
  Administered 2015-10-23 (×2): 2 [IU] via SUBCUTANEOUS

## 2015-10-20 MED ORDER — ACETAMINOPHEN 325 MG PO TABS
650.0000 mg | ORAL_TABLET | Freq: Four times a day (QID) | ORAL | Status: DC | PRN
  Administered 2015-10-23: 650 mg via ORAL
  Filled 2015-10-20: qty 2

## 2015-10-20 MED ORDER — ENSURE ENLIVE PO LIQD
237.0000 mL | Freq: Two times a day (BID) | ORAL | Status: DC
  Administered 2015-10-21: 237 mL via ORAL

## 2015-10-20 MED ORDER — ONDANSETRON HCL 4 MG PO TABS
4.0000 mg | ORAL_TABLET | Freq: Four times a day (QID) | ORAL | Status: DC | PRN
  Administered 2015-10-21: 4 mg via ORAL
  Filled 2015-10-20: qty 1

## 2015-10-20 MED ORDER — HYDROCODONE-ACETAMINOPHEN 5-325 MG PO TABS: 1.0000 | ORAL_TABLET | ORAL | Status: DC | PRN

## 2015-10-20 MED ORDER — PNEUMOCOCCAL VAC POLYVALENT 25 MCG/0.5ML IJ INJ
0.5000 mL | INJECTION | INTRAMUSCULAR | Status: AC
  Administered 2015-10-21: 0.5 mL via INTRAMUSCULAR
  Filled 2015-10-20: qty 0.5

## 2015-10-20 MED ORDER — ONDANSETRON HCL 4 MG/2ML IJ SOLN
4.0000 mg | Freq: Three times a day (TID) | INTRAMUSCULAR | Status: DC | PRN
  Filled 2015-10-20: qty 2

## 2015-10-20 MED ORDER — ENOXAPARIN SODIUM 30 MG/0.3ML ~~LOC~~ SOLN
30.0000 mg | SUBCUTANEOUS | Status: DC
  Administered 2015-10-20: 30 mg via SUBCUTANEOUS
  Filled 2015-10-20 (×2): qty 0.3

## 2015-10-20 MED ORDER — ACETAMINOPHEN 650 MG RE SUPP: 650.0000 mg | Freq: Four times a day (QID) | RECTAL | Status: DC | PRN

## 2015-10-20 MED ORDER — CLONIDINE HCL 0.1 MG PO TABS
0.1000 mg | ORAL_TABLET | Freq: Four times a day (QID) | ORAL | Status: DC | PRN
  Administered 2015-10-21 – 2015-10-23 (×3): 0.1 mg via ORAL
  Filled 2015-10-20 (×3): qty 1

## 2015-10-20 MED ORDER — SODIUM CHLORIDE 0.9 % IJ SOLN
3.0000 mL | Freq: Two times a day (BID) | INTRAMUSCULAR | Status: DC
  Administered 2015-10-20 – 2015-10-21 (×2): 3 mL via INTRAVENOUS

## 2015-10-20 MED ORDER — SODIUM CHLORIDE 0.9 % IV SOLN
INTRAVENOUS | Status: DC
  Administered 2015-10-20: 04:00:00 via INTRAVENOUS

## 2015-10-20 MED ORDER — SODIUM CHLORIDE 0.9 % IV SOLN
INTRAVENOUS | Status: DC
  Administered 2015-10-20 – 2015-10-23 (×8): via INTRAVENOUS

## 2015-10-20 MED ORDER — ONDANSETRON HCL 4 MG/2ML IJ SOLN
4.0000 mg | Freq: Four times a day (QID) | INTRAMUSCULAR | Status: DC | PRN
  Administered 2015-10-20 – 2015-10-21 (×3): 4 mg via INTRAVENOUS
  Filled 2015-10-20 (×2): qty 2

## 2015-10-20 MED ORDER — INSULIN ASPART 100 UNIT/ML ~~LOC~~ SOLN
0.0000 [IU] | SUBCUTANEOUS | Status: DC
  Administered 2015-10-20: 2 [IU] via SUBCUTANEOUS
  Administered 2015-10-20: 3 [IU] via SUBCUTANEOUS
  Filled 2015-10-20 (×2): qty 1

## 2015-10-20 NOTE — Progress Notes (Signed)
Called ED for report; spoke with [facility]

## 2015-10-20 NOTE — ED Notes (Signed)
CBG was 313

## 2015-10-20 NOTE — Progress Notes (Signed)
Patient arrived on unit from ED via stretcher.  Telemetry placed per MD order.  CMT notified.

## 2015-10-20 NOTE — ED Notes (Signed)
Phlebotomy at the bedside  

## 2015-10-20 NOTE — ED Notes (Signed)
Pt ambulated to the restroom.

## 2015-10-20 NOTE — ED Notes (Signed)
Patient CBG was 170 , Nurse was informed.

## 2015-10-20 NOTE — Progress Notes (Addendum)
[facility] PROGRESS NOTE  Cathy Green [id number] DOB: 1981-09-09 DOA: 10/19/2015 PCP: Lance Bosch, NP  Summary: 35 yo female with DM1, HTN presents with vomiting and diarrhea, found to have uncontrolled DM with normal AG, acute renal failure  Assessment/Plan:  Principal Problem:   DM (diabetes mellitus), type 1, uncontrolled, with renal complications ([place]): improved. AG normal Active Problems:   Nausea vomiting and diarrhea: resolved.    ARF (acute renal failure) ([place]): prerenal. Continue IVF and monitor BMETs   Anemia   Hypertension: reportedly not talking lisinopril at home. Clonidine for now and monitor   Dehydration  Code Status:  full Family Communication:   Disposition Plan:  home  Consultants:    Procedures:     Antibiotics:    HPI/Subjective: No n/v/d.  Objective: Filed Vitals:   10/20/15 1100 10/20/15 1231  BP:  185/93  Pulse: 80 82  Temp:  98.8 F (37.1 C)  Resp: 16 18    Intake/Output Summary (Last 24 hours) at 10/20/15 1546 Last data filed at 10/20/15 1400  Gross per 24 hour  Intake      3 ml  Output      0 ml  Net      3 ml   Filed Weights   10/20/15 1231  Weight: 69.9 kg (154 lb 1.6 oz)    Exam:   General:  Asleep. Arousable, but wont say much or cooperate with exam  Cardiovascular: RRR without MGR  Respiratory: CTA without WRR  Abdomen: S, NT, ND  Ext: no CCE  Basic Metabolic Panel:  Recent Labs Lab 10/18/15 1526 10/19/15 1557 10/19/15 2241 10/20/15 0309  NA 132* 135 139 143  K 4.8 4.1 4.1 4.2  CL 100 101 107 112*  CO2 21 19* 19* 22  GLUCOSE 502* 369* 365* 325*  BUN 31* 25* 27* 28*  CREATININE 2.79* 2.38* 2.12* 2.10*  CALCIUM 8.1* 8.9 8.0* 8.1*  MG  --   --   --  2.1  PHOS  --   --   --  3.0   Liver Function Tests:  Recent Labs Lab 10/18/15 1526 10/19/15 1557 10/20/15 0309  AST 11 17 13*  ALT 11 16 12*  ALKPHOS 57 71 61  BILITOT 0.5 0.9 0.8  PROT 5.9* 7.2 6.0*  ALBUMIN 3.0* 3.0*  2.5*    Recent Labs Lab 10/19/15 1557  LIPASE 31   No results for input(s): AMMONIA in the last 168 hours. CBC:  Recent Labs Lab 10/18/15 1526 10/19/15 1557 10/20/15 0309  WBC 15.3* 14.4* 15.0*  NEUTROABS 12.2*  --   --   HGB 10.0* 11.4* 10.4*  HCT 32.0* 34.3* 31.4*  MCV 84.2 80.1 80.1  PLT 260 278 243   Cardiac Enzymes: No results for input(s): CKTOTAL, CKMB, CKMBINDEX, TROPONINI in the last 168 hours. BNP (last 3 results) No results for input(s): BNP in the last 8760 hours.  ProBNP (last 3 results) No results for input(s): PROBNP in the last 8760 hours.  CBG:  Recent Labs Lab 10/19/15 2209 10/20/15 0051 10/20/15 1213  GLUCAP 304* 313* 104*    No results found for this or any previous visit (from the past 240 hour(s)).   Studies: No results found.  Scheduled Meds: . enoxaparin (LOVENOX) injection  30 mg Subcutaneous Q24H  . feeding supplement (ENSURE ENLIVE)  237 mL Oral BID BM  . insulin aspart  0-9 Units Subcutaneous 6 times per day  . insulin NPH Human  37 Units Subcutaneous BID AC &  HS  . [START ON 10/21/2015] pneumococcal 23 valent vaccine  0.5 mL Intramuscular Tomorrow-1000  . sodium chloride  3 mL Intravenous Q12H   Continuous Infusions: . sodium chloride 150 mL/hr at 10/20/15 1453    Time spent: 25 minutes  [facility] [email or web address], password [facility] 10/20/2015, 3:46 PM

## 2015-10-20 NOTE — ED Notes (Signed)
CBG 248  

## 2015-10-20 NOTE — Progress Notes (Signed)
Hypoglycemic Event  CBG: 65  Treatment: 15 GM carbohydrate snack  Symptoms: None  Follow-up CBG: Time:2148 CBG Result:92  Possible Reasons for Event: Inadequate meal intake  Comments/MD notified: [place]   Cathy Green I

## 2015-10-21 DIAGNOSIS — R112 Nausea with vomiting, unspecified: Secondary | ICD-10-CM

## 2015-10-21 DIAGNOSIS — R197 Diarrhea, unspecified: Secondary | ICD-10-CM

## 2015-10-21 DIAGNOSIS — E860 Dehydration: Secondary | ICD-10-CM

## 2015-10-21 DIAGNOSIS — E1065 Type 1 diabetes mellitus with hyperglycemia: Secondary | ICD-10-CM

## 2015-10-21 DIAGNOSIS — N179 Acute kidney failure, unspecified: Principal | ICD-10-CM

## 2015-10-21 DIAGNOSIS — I10 Essential (primary) hypertension: Secondary | ICD-10-CM

## 2015-10-21 DIAGNOSIS — E1029 Type 1 diabetes mellitus with other diabetic kidney complication: Secondary | ICD-10-CM

## 2015-10-21 LAB — BASIC METABOLIC PANEL
Anion gap: 7 (ref 5–15)
BUN: 15 mg/dL (ref 6–20)
CALCIUM: 8 mg/dL — AB (ref 8.9–10.3)
CHLORIDE: 111 mmol/L (ref 101–111)
CO2: 21 mmol/L — AB (ref 22–32)
CREATININE: 1.83 mg/dL — AB (ref 0.44–1.00)
GFR calc non Af Amer: 35 mL/min — ABNORMAL LOW (ref >60)
GFR, EST AFRICAN AMERICAN: 41 mL/min — AB (ref >60)
Glucose, Bld: 80 mg/dL (ref 65–99)
Potassium: 3.3 mmol/L — ABNORMAL LOW (ref 3.5–5.1)
SODIUM: 139 mmol/L (ref 135–145)

## 2015-10-21 LAB — CBC WITH DIFFERENTIAL/PLATELET
BASOS PCT: 0 %
Basophils Absolute: 0 10*3/uL (ref 0.0–0.1)
EOS ABS: 0 10*3/uL (ref 0.0–0.7)
EOS PCT: 0 %
HCT: 30.8 % — ABNORMAL LOW (ref 36.0–46.0)
Hemoglobin: 10.1 g/dL — ABNORMAL LOW (ref 12.0–15.0)
LYMPHS ABS: 2.2 10*3/uL (ref 0.7–4.0)
Lymphocytes Relative: 19 %
MCH: 26.8 pg (ref 26.0–34.0)
MCHC: 32.8 g/dL (ref 30.0–36.0)
MCV: 81.7 fL (ref 78.0–100.0)
MONO ABS: 0.6 10*3/uL (ref 0.1–1.0)
MONOS PCT: 6 %
Neutro Abs: 8.4 10*3/uL — ABNORMAL HIGH (ref 1.7–7.7)
Neutrophils Relative %: 75 %
PLATELETS: 239 10*3/uL (ref 150–400)
RBC: 3.77 MIL/uL — ABNORMAL LOW (ref 3.87–5.11)
RDW: 13.1 % (ref 11.5–15.5)
WBC: 11.2 10*3/uL — ABNORMAL HIGH (ref 4.0–10.5)

## 2015-10-21 LAB — GLUCOSE, CAPILLARY
GLUCOSE-CAPILLARY: 83 mg/dL (ref 65–99)
GLUCOSE-CAPILLARY: 86 mg/dL (ref 65–99)
Glucose-Capillary: 248 mg/dL — ABNORMAL HIGH (ref 65–99)
Glucose-Capillary: 170 mg/dL — ABNORMAL HIGH (ref 65–99)
Glucose-Capillary: 99 mg/dL (ref 65–99)
Glucose-Capillary: 99 mg/dL (ref 65–99)

## 2015-10-21 MED ORDER — ENOXAPARIN SODIUM 40 MG/0.4ML ~~LOC~~ SOLN
40.0000 mg | SUBCUTANEOUS | Status: DC
  Administered 2015-10-21 – 2015-10-22 (×2): 40 mg via SUBCUTANEOUS
  Filled 2015-10-21 (×2): qty 0.4

## 2015-10-21 MED ORDER — POTASSIUM CHLORIDE CRYS ER 20 MEQ PO TBCR
40.0000 meq | EXTENDED_RELEASE_TABLET | Freq: Once | ORAL | Status: AC
  Administered 2015-10-21: 40 meq via ORAL
  Filled 2015-10-21: qty 2

## 2015-10-21 MED ORDER — GLUCERNA SHAKE PO LIQD
237.0000 mL | Freq: Three times a day (TID) | ORAL | Status: DC
  Administered 2015-10-21 – 2015-10-23 (×4): 237 mL via ORAL

## 2015-10-21 NOTE — Progress Notes (Signed)
Initial Nutrition Assessment  DOCUMENTATION CODES:   Not applicable  INTERVENTION:  Provide Glucerna Shake po TID, each supplement provides 220 kcal and 10 grams of protein.  Encourage adequate PO intake.   NUTRITION DIAGNOSIS:   Inadequate oral intake related to nausea as evidenced by meal completion < 25%.  GOAL:   Patient will meet greater than or equal to 90% of their needs  MONITOR:   PO intake, Supplement acceptance, Weight trends, Labs, I & O's  REASON FOR ASSESSMENT:   Malnutrition Screening Tool    ASSESSMENT:   35 yo female with DM1, HTN presents with vomiting and diarrhea, found to have uncontrolled DM with normal AG, acute renal failure  Pt reports little to no po intake since Sunday 1/15 due to n/v. Pt reports 0% meal completion this AM as pt reports still having nausea. Weight has been stable. Pt currently has Ensure ordered. RD to modify orders and order Glucerna instead. Pt was encouraged to eat her food at meals.  Pt with no observed significant fat or muscle mass loss.   Labs: Low potassium, CO2, calcium, and GFR. High creatinine.   Diet Order:  Diet Carb Modified Fluid consistency:: Thin; Room service appropriate?: Yes  Skin:  Reviewed, no issues  Last BM:  1/17  Height:   Ht Readings from Last 1 Encounters:  10/20/15 5\' 1"  (1.549 m)    Weight:   Wt Readings from Last 1 Encounters:  10/20/15 154 lb 1.6 oz (69.9 kg)    Ideal Body Weight:  47.7 kg  BMI:  Body mass index is 29.13 kg/(m^2).  Estimated Nutritional Needs:   Kcal:  [id number]  Protein:  80-90 grams  Fluid:  Per MD  EDUCATION NEEDS:   No education needs identified at this time  Corrin Parker, MS, RD, LDN Pager # [phone] After hours/ weekend pager # [phone]

## 2015-10-21 NOTE — Progress Notes (Signed)
[facility] PROGRESS NOTE  Nehemie Noreen [id number] DOB: 21-Oct-1980 DOA: 10/19/2015 PCP: Lance Bosch, NP  Summary: 35 yo female with DM1, HTN presents with vomiting and diarrhea, found to have uncontrolled DM with normal AG, acute renal failure  Assessment/Plan: Principal Problem:   DM (diabetes mellitus), type 1, uncontrolled, with renal complications ([place]) Active Problems:   Anemia   Hypertension   Dehydration   Nausea vomiting and diarrhea   Nausea and vomiting   ARF (acute renal failure) (HCC)   Diabetes mellitus type 1, uncontrolled Uncontrolled with hemoglobin A1c of 13.3, presented to hospital with blood sugar of 541. Patient restarted on Novolin and NovoLog insulin, interval low blood sugar. Last insulin was given yesterday morning, patient was still in the low side, this could be because of the high creatinine. Continue to hold insulin and monitor.  Nausea vomiting and diarrhea Unclear etiology, this is resolved, she is not vomiting anymore slight nausea. Treat symptomatically with antiemetics, she might need gastric emptying study as outpatient.  ARF (acute renal failure) Acute renal failure, baseline creatinine is 1.0 from September 2015, presented with creatinine of 2.79. This likely secondary to hypovolemia from nausea and vomiting, hydrate with IV fluids and check BMP in a.m.  Anemia  Hypertension Reportedly not talking lisinopril at home. Clonidine for now and monitor  Dehydration Secondary to nausea and vomiting, on IV fluids.  Code Status:  full Family Communication:   Disposition Plan:  home  Consultants:    Procedures:     Antibiotics:    HPI/Subjective: No n/v/d.  Objective: Filed Vitals:   10/21/15 0600 10/21/15 1010  BP: 144/77 143/84  Pulse:  71  Temp:  98.7 F (37.1 C)  Resp:  16    Intake/Output Summary (Last 24 hours) at 10/21/15 1227 Last data filed at 10/21/15 1000  Gross per 24 hour  Intake 2011.67  ml  Output    150 ml  Net 1861.67 ml   Filed Weights   10/20/15 1231  Weight: 69.9 kg (154 lb 1.6 oz)    Exam:   General:  Asleep. Arousable, but wont say much or cooperate with exam  Cardiovascular: RRR without MGR  Respiratory: CTA without WRR  Abdomen: S, NT, ND  Ext: no CCE  Basic Metabolic Panel:  Recent Labs Lab 10/18/15 1526 10/19/15 1557 10/19/15 2241 10/20/15 0309 10/21/15 0542  NA 132* 135 139 143 139  K 4.8 4.1 4.1 4.2 3.3*  CL 100 101 107 112* 111  CO2 21 19* 19* 22 21*  GLUCOSE 502* 369* 365* 325* 80  BUN 31* 25* 27* 28* 15  CREATININE 2.79* 2.38* 2.12* 2.10* 1.83*  CALCIUM 8.1* 8.9 8.0* 8.1* 8.0*  MG  --   --   --  2.1  --   PHOS  --   --   --  3.0  --    Liver Function Tests:  Recent Labs Lab 10/18/15 1526 10/19/15 1557 10/20/15 0309  AST 11 17 13*  ALT 11 16 12*  ALKPHOS 57 71 61  BILITOT 0.5 0.9 0.8  PROT 5.9* 7.2 6.0*  ALBUMIN 3.0* 3.0* 2.5*    Recent Labs Lab 10/19/15 1557  LIPASE 31   No results for input(s): AMMONIA in the last 168 hours. CBC:  Recent Labs Lab 10/18/15 1526 10/19/15 1557 10/20/15 0309 10/21/15 0542  WBC 15.3* 14.4* 15.0* 11.2*  NEUTROABS 12.2*  --   --  8.4*  HGB 10.0* 11.4* 10.4* 10.1*  HCT 32.0* 34.3* 31.4* 30.8*  MCV 84.2 80.1 80.1 81.7  PLT 260 278 243 239   Cardiac Enzymes: No results for input(s): CKTOTAL, CKMB, CKMBINDEX, TROPONINI in the last 168 hours. BNP (last 3 results) No results for input(s): BNP in the last 8760 hours.  ProBNP (last 3 results) No results for input(s): PROBNP in the last 8760 hours.  CBG:  Recent Labs Lab 10/20/15 2022 10/20/15 2148 10/20/15 2319 10/21/15 0741 10/21/15 1152  GLUCAP 65 92 90 83 86    No results found for this or any previous visit (from the past 240 hour(s)).   Studies: No results found.  Scheduled Meds: . enoxaparin (LOVENOX) injection  30 mg Subcutaneous Q24H  . feeding supplement (ENSURE ENLIVE)  237 mL Oral BID BM  . insulin  aspart  0-9 Units Subcutaneous TID WC  . insulin NPH Human  37 Units Subcutaneous BID AC & HS  . sodium chloride  3 mL Intravenous Q12H   Continuous Infusions: . sodium chloride 100 mL/hr at 10/21/15 1105    Time spent: 25 minutes  [facility] [email or web address], password [email or web address] 10/21/2015, 12:27 PM

## 2015-10-22 LAB — BASIC METABOLIC PANEL
Anion gap: 8 (ref 5–15)
BUN: 13 mg/dL (ref 6–20)
CALCIUM: 8.2 mg/dL — AB (ref 8.9–10.3)
CO2: 21 mmol/L — ABNORMAL LOW (ref 22–32)
CREATININE: 1.85 mg/dL — AB (ref 0.44–1.00)
Chloride: 109 mmol/L (ref 101–111)
GFR calc non Af Amer: 35 mL/min — ABNORMAL LOW (ref >60)
GFR, EST AFRICAN AMERICAN: 40 mL/min — AB (ref >60)
Glucose, Bld: 88 mg/dL (ref 65–99)
POTASSIUM: 3.4 mmol/L — AB (ref 3.5–5.1)
SODIUM: 138 mmol/L (ref 135–145)

## 2015-10-22 LAB — CBC
HCT: 32.4 % — ABNORMAL LOW (ref 36.0–46.0)
HEMOGLOBIN: 10.6 g/dL — AB (ref 12.0–15.0)
MCH: 26.7 pg (ref 26.0–34.0)
MCHC: 32.7 g/dL (ref 30.0–36.0)
MCV: 81.6 fL (ref 78.0–100.0)
PLATELETS: 252 10*3/uL (ref 150–400)
RBC: 3.97 MIL/uL (ref 3.87–5.11)
RDW: 12.8 % (ref 11.5–15.5)
WBC: 7.5 10*3/uL (ref 4.0–10.5)

## 2015-10-22 LAB — GLUCOSE, CAPILLARY
GLUCOSE-CAPILLARY: 96 mg/dL (ref 65–99)
GLUCOSE-CAPILLARY: 86 mg/dL (ref 65–99)
Glucose-Capillary: 104 mg/dL — ABNORMAL HIGH (ref 65–99)
Glucose-Capillary: 132 mg/dL — ABNORMAL HIGH (ref 65–99)

## 2015-10-22 MED ORDER — POTASSIUM CHLORIDE CRYS ER 20 MEQ PO TBCR
40.0000 meq | EXTENDED_RELEASE_TABLET | Freq: Four times a day (QID) | ORAL | Status: AC
  Administered 2015-10-22 (×2): 40 meq via ORAL
  Filled 2015-10-22 (×2): qty 2

## 2015-10-22 NOTE — Progress Notes (Signed)
Pt's BP 188/78. Pt denies symptoms. Gave 0.1 mg clonidine per PRN orders. Will continue to monitor.

## 2015-10-22 NOTE — Progress Notes (Signed)
[facility] PROGRESS NOTE  Cathy Green [id number] DOB: Apr 24, 1981 DOA: 10/19/2015 PCP: Lance Bosch, NP   HPI/Subjective: Ate only 2 meals since yesterday, both were cup of chicken broth. Continues to have nausea but no vomiting. Blood sugar is between 80 and 110, did not receive any insulin since the night before last.  Summary: 35 yo female with DM1, HTN presents with vomiting and diarrhea, found to have uncontrolled DM with normal AG, acute renal failure  Assessment/Plan: Principal Problem:   DM (diabetes mellitus), type 1, uncontrolled, with renal complications (H[facility]) Active Problems:   Anemia   Hypertension   Dehydration   Nausea vomiting and diarrhea   Nausea and vomiting   ARF (acute renal failure) (HCC)   Diabetes mellitus type 1, uncontrolled Uncontrolled with hemoglobin A1c of 13.3, presented to hospital with blood sugar of 541. Patient restarted on Novolin and NovoLog insulin, interval low blood sugar. Last insulin was given yesterday morning, patient was still in the low side, this could be because of the high creatinine. Long acting insulin is on hold,  continue SSI, very poor oral intake   Nausea vomiting and diarrhea Unclear etiology, this is resolved, she is not vomiting anymore slight nausea. Treat symptomatically with antiemetics, she might need gastric emptying study as outpatient.  ARF (acute renal failure) Acute renal failure, baseline creatinine is 1.0 from September 2015, presented with creatinine of 2.79. This likely secondary to hypovolemia from nausea and vomiti. Continue IV fluids, check BMP  Anemia  Hypertension Reportedly not talking lisinopril at home. Clonidine for now and monitor  Dehydration Secondary to nausea and vomiting, on IV fluids.  Hypokalemia Replete with oral supplements.  Leukocytosis This is likely secondary to stress from nausea/vomiting/diarrhea, this is resolved. WBC were 15 on admission, currently  7.5.  Code Status:  full Family Communication:   Disposition Plan:  home  Consultants:    Procedures:     Antibiotics:     Objective: Filed Vitals:   10/22/15 0507 10/22/15 0848  BP: 154/91 171/94  Pulse: 85 99  Temp: 99.1 F (37.3 C) 98.2 F (36.8 C)  Resp: 18 18    Intake/Output Summary (Last 24 hours) at 10/22/15 1300 Last data filed at 10/22/15 0602  Gross per 24 hour  Intake 2883.33 ml  Output    300 ml  Net 2583.33 ml   Filed Weights   10/20/15 1231 10/21/15 2154  Weight: 69.9 kg (154 lb 1.6 oz) 70.6 kg (155 lb 10.3 oz)    Exam:   General:  Asleep. Arousable, but wont say much or cooperate with exam  Cardiovascular: RRR without MGR  Respiratory: CTA without WRR  Abdomen: S, NT, ND  Ext: no CCE  Basic Metabolic Panel:  Recent Labs Lab 10/19/15 1557 10/19/15 2241 10/20/15 0309 10/21/15 0542 10/22/15 0440  NA 135 139 143 139 138  K 4.1 4.1 4.2 3.3* 3.4*  CL 101 107 112* 111 109  CO2 19* 19* 22 21* 21*  GLUCOSE 369* 365* 325* 80 88  BUN 25* 27* 28* 15 13  CREATININE 2.38* 2.12* 2.10* 1.83* 1.85*  CALCIUM 8.9 8.0* 8.1* 8.0* 8.2*  MG  --   --  2.1  --   --   PHOS  --   --  3.0  --   --    Liver Function Tests:  Recent Labs Lab 10/18/15 1526 10/19/15 1557 10/20/15 0309  AST 11 17 13*  ALT 11 16 12*  ALKPHOS 57 71 61  BILITOT  0.5 0.9 0.8  PROT 5.9* 7.2 6.0*  ALBUMIN 3.0* 3.0* 2.5*    Recent Labs Lab 10/19/15 1557  LIPASE 31   No results for input(s): AMMONIA in the last 168 hours. CBC:  Recent Labs Lab 10/18/15 1526 10/19/15 1557 10/20/15 0309 10/21/15 0542 10/22/15 0440  WBC 15.3* 14.4* 15.0* 11.2* 7.5  NEUTROABS 12.2*  --   --  8.4*  --   HGB 10.0* 11.4* 10.4* 10.1* 10.6*  HCT 32.0* 34.3* 31.4* 30.8* 32.4*  MCV 84.2 80.1 80.1 81.7 81.6  PLT 260 278 243 239 252   Cardiac Enzymes: No results for input(s): CKTOTAL, CKMB, CKMBINDEX, TROPONINI in the last 168 hours. BNP (last 3 results) No results for  input(s): BNP in the last 8760 hours.  ProBNP (last 3 results) No results for input(s): PROBNP in the last 8760 hours.  CBG:  Recent Labs Lab 10/21/15 1152 10/21/15 1629 10/21/15 2028 10/22/15 0817 10/22/15 1242  GLUCAP 86 99 99 96 104*    No results found for this or any previous visit (from the past 240 hour(s)).   Studies: No results found.  Scheduled Meds: . enoxaparin (LOVENOX) injection  40 mg Subcutaneous Q24H  . feeding supplement (GLUCERNA SHAKE)  237 mL Oral TID BM  . insulin aspart  0-9 Units Subcutaneous TID WC  . sodium chloride  3 mL Intravenous Q12H   Continuous Infusions: . sodium chloride 100 mL/hr at 10/22/15 0802    Time spent: 25 minutes  [facility] [email or web address], password [facility] 10/22/2015, 1:00 PM

## 2015-10-23 LAB — BASIC METABOLIC PANEL
Anion gap: 8 (ref 5–15)
BUN: 14 mg/dL (ref 6–20)
CHLORIDE: 106 mmol/L (ref 101–111)
CO2: 22 mmol/L (ref 22–32)
CREATININE: 1.55 mg/dL — AB (ref 0.44–1.00)
Calcium: 8.5 mg/dL — ABNORMAL LOW (ref 8.9–10.3)
GFR calc non Af Amer: 43 mL/min — ABNORMAL LOW (ref >60)
GFR, EST AFRICAN AMERICAN: 50 mL/min — AB (ref >60)
GLUCOSE: 213 mg/dL — AB (ref 65–99)
Potassium: 4.5 mmol/L (ref 3.5–5.1)
Sodium: 136 mmol/L (ref 135–145)

## 2015-10-23 LAB — GLUCOSE, CAPILLARY
Glucose-Capillary: 168 mg/dL — ABNORMAL HIGH (ref 65–99)
Glucose-Capillary: 182 mg/dL — ABNORMAL HIGH (ref 65–99)

## 2015-10-23 MED ORDER — LISINOPRIL 10 MG PO TABS: 10.0000 mg | ORAL_TABLET | Freq: Every day | ORAL | Status: DC

## 2015-10-23 NOTE — Progress Notes (Signed)
Patient bp 165/104. Clonidine 0.1 mg given per prn for diastolic [id number]. Will continue to monitor. Bartholomew Crews, RN

## 2015-10-23 NOTE — Discharge Summary (Signed)
Physician Discharge Summary  Cathy Green HQI:[id number] DOB: Aug 24, 1981 DOA: 10/19/2015  PCP: Lance Bosch, NP  Admit date: 10/19/2015 Discharge date: 10/23/2015  Time spent: 40 minutes  Recommendations for Outpatient Follow-up:  [facility]. [facility]. 2. Obtain BMP follow renal function. 3. Consider switching insulin to Lantus and NovoLog   Discharge Diagnoses:  Principal Problem:   DM (diabetes mellitus), type 1, uncontrolled, with renal complications ([place]) Active Problems:   Anemia   Hypertension   Dehydration   Nausea vomiting and diarrhea   Nausea and vomiting   ARF (acute renal failure) ([place])   Discharge Condition: Stable  Diet recommendation: Carbohydrates modified diet  Filed Weights   10/20/15 1231 10/21/15 2154 10/22/15 2021  Weight: 69.9 kg (154 lb 1.6 oz) 70.6 kg (155 lb 10.3 oz) 73 kg (160 lb 15 oz)    History of present illness:  Cathy Green is a 35 y.o. female   has a past medical history of Abnormal Pap smear; Diabetes mellitus; Hypertension; Hyperlipidemia; Chronic kidney disease; Migraines; Anemia; Allergy; Glaucoma; Noncompliance; and Numbness in left leg.   Presented with  She was seen yesterday by her primary care provider today with 2 days history of nausea vomiting and diarrhea is been feeling lightheaded and having trouble eating.given IV fluids in the office. She was reporting having more than 10 stools a day. Her diarrhea has finally improved.  Records patient have had some troubles with 8 noncompliance yesterday her blood sugar was in 500 range but today recheck blood sugar was down to 300. Patient states her glucometer has been broken and she hasn't been using it. Reports not using her insulin because she was too weak to get it.  Denies have any blood or black stools. No blood in the vomiting. No coffee ground emesis. In emergency department UA showed some ketones. Labs showing non-gap acidosis  patient has had worsening renal function with creatinine Up to 2.38  Hospitalist was called for admission for dehydration acute renal failure possibly secondary gastroenteritis  Hospital Course:    Diabetes mellitus type 1, uncontrolled Uncontrolled with hemoglobin A1c of 13.3, presented to hospital with blood sugar of 541. Patient restarted on Novolin and NovoLog insulin. Patient did not require insulin for almost a day and half in the hospital, this could be secondary to slow excretion of insulin secondary to acute renal failure. She also had poor oral intake treated with SSI while she was in the hospital. Please consider switching Novolin to Lantus or Levemir insulin as outpatient. Patient used to be on Lantus and was discontinued because of financial hardship, she is getting health insurance now.  Nausea vomiting and diarrhea Unclear etiology, this is resolved, she is not vomiting anymore slight nausea. Treated symptomatically with antiemetics. This is could be transient viral enteritis, if symptom recurs while she is not having other symptoms consider gastric emptying study.  ARF (acute renal failure) Acute renal failure, baseline creatinine is 1.0 from September 2015, presented with creatinine of 2.79. This likely secondary to hypovolemia from nausea and vomiting. Treated with IV fluids, on day of discharge the MP was still pending. Creatinine is 1.8, she will follow-up with PCP on the 25th, please check BMP to check for renal function. Patient will be on lisinopril, I have asked her not to take it until she sees her PCP on the 25th and renal function checked again.  Anemia  Hypertension Reportedly not talking lisinopril at home. Clonidine for now and  monitor  Dehydration Secondary to nausea and vomiting, on IV fluids.  Hypokalemia Repleted with oral supplements.  Leukocytosis This is likely secondary to stress from nausea/vomiting/diarrhea, this is resolved. WBC were 15  on admission, currently 7.5.   Procedures:  None  Consultations:  None  Discharge Exam: Filed Vitals:   10/23/15 0536 10/23/15 0930  BP: 139/83 139/64  Pulse: 79 80  Temp: 98 F (36.7 C) 100.1 F (37.8 C)  Resp: 16 16   General: Alert and awake, oriented x3, not in any acute distress. HEENT: anicteric sclera, pupils reactive to light and accommodation, EOMI CVS: S1-S2 clear, no murmur rubs or gallops Chest: clear to auscultation bilaterally, no wheezing, rales or rhonchi Abdomen: soft nontender, nondistended, normal bowel sounds, no organomegaly Extremities: no cyanosis, clubbing or edema noted bilaterally Neuro: Cranial nerves II-XII intact, no focal neurological deficits  Discharge Instructions   Discharge Instructions    Diet - low sodium heart healthy    Complete by:  As directed      Increase activity slowly    Complete by:  As directed           Current Discharge Medication List    CONTINUE these medications which have CHANGED   Details  lisinopril (PRINIVIL,ZESTRIL) 10 MG tablet Take 1 tablet (10 mg total) by mouth daily. Qty: 30 tablet, Refills: 3   Associated Diagnoses: Essential hypertension      CONTINUE these medications which have NOT CHANGED   Details  Blood Glucose Monitoring Suppl (TRUE METRIX METER) w/Device KIT 37 Units/day by Does not apply route 4 (four) times daily. Qty: 100 kit, Refills: 3    glucose blood (TRUE METRIX BLOOD GLUCOSE TEST) test strip Use as instructed Qty: 100 each, Refills: 12    insulin aspart (NOVOLOG) 100 UNIT/ML injection Inject 0-10 Units into the skin 3 (three) times daily with meals. Qty: 30 mL, Refills: 3    insulin NPH Human (HUMULIN N,NOVOLIN N) 100 UNIT/ML injection Inject 0.37 mLs (37 Units total) into the skin 2 (two) times daily. Qty: 30 mL, Refills: 3   Associated Diagnoses: Type 2 diabetes mellitus without complication, with long-term current use of insulin (HCC)    TRUEPLUS LANCETS 28G MISC 37  Units by Does not apply route 4 (four) times daily. Qty: 100 each, Refills: 3    Vitamin D, Ergocalciferol, (DRISDOL) 50000 UNITS CAPS capsule Take 1 capsule (50,000 Units total) by mouth every 7 (seven) days. Qty: 12 capsule, Refills: 0       No Known Allergies Follow-up Information    Follow up with Lance Bosch, NP On 10/26/2015.   Specialty:  Internal Medicine   Why:  @ 4:30 PM   Contact information:   [place] [address] [phone]        The results of significant diagnostics from this hospitalization (including imaging, microbiology, ancillary and laboratory) are listed below for reference.    Significant Diagnostic Studies: No results found.  Microbiology: No results found for this or any previous visit (from the past 240 hour(s)).   Labs: Basic Metabolic Panel:  Recent Labs Lab 10/19/15 1557 10/19/15 2241 10/20/15 0309 10/21/15 0542 10/22/15 0440  NA 135 139 143 139 138  K 4.1 4.1 4.2 3.3* 3.4*  CL 101 107 112* 111 109  CO2 19* 19* 22 21* 21*  GLUCOSE 369* 365* 325* 80 88  BUN 25* 27* 28* 15 13  CREATININE 2.38* 2.12* 2.10* 1.83* 1.85*  CALCIUM 8.9 8.0* 8.1*  8.0* 8.2*  MG  --   --  2.1  --   --   PHOS  --   --  3.0  --   --    Liver Function Tests:  Recent Labs Lab 10/18/15 1526 10/19/15 1557 10/20/15 0309  AST 11 17 13*  ALT 11 16 12*  ALKPHOS 57 71 61  BILITOT 0.5 0.9 0.8  PROT 5.9* 7.2 6.0*  ALBUMIN 3.0* 3.0* 2.5*    Recent Labs Lab 10/19/15 1557  LIPASE 31   No results for input(s): AMMONIA in the last 168 hours. CBC:  Recent Labs Lab 10/18/15 1526 10/19/15 1557 10/20/15 0309 10/21/15 0542 10/22/15 0440  WBC 15.3* 14.4* 15.0* 11.2* 7.5  NEUTROABS 12.2*  --   --  8.4*  --   HGB 10.0* 11.4* 10.4* 10.1* 10.6*  HCT 32.0* 34.3* 31.4* 30.8* 32.4*  MCV 84.2 80.1 80.1 81.7 81.6  PLT 260 278 243 239 252   Cardiac Enzymes: No results for input(s): CKTOTAL, CKMB, CKMBINDEX, TROPONINI in the last 168  hours. BNP: BNP (last 3 results) No results for input(s): BNP in the last 8760 hours.  ProBNP (last 3 results) No results for input(s): PROBNP in the last 8760 hours.  CBG:  Recent Labs Lab 10/22/15 0817 10/22/15 1242 10/22/15 1631 10/22/15 2106 10/23/15 0730  GLUCAP 96 104* 86 132* 168*       Signed:  Agustina Witzke A MD.  [facility] 10/23/2015, 11:10 AM

## 2015-10-23 NOTE — Progress Notes (Signed)
Cathy Green to be D/C'd Home per MD order.  Discussed prescriptions and follow up appointments with the patient. Prescriptions given to patient, medication list explained in detail. Pt verbalized understanding.    Medication List    TAKE these medications        glucose blood test strip  Commonly known as:  TRUE METRIX BLOOD GLUCOSE TEST  Use as instructed     insulin aspart 100 UNIT/ML injection  Commonly known as:  novoLOG  Inject 0-10 Units into the skin 3 (three) times daily with meals.     insulin NPH Human 100 UNIT/ML injection  Commonly known as:  HUMULIN N,NOVOLIN N  Inject 0.37 mLs (37 Units total) into the skin 2 (two) times daily.     lisinopril 10 MG tablet  Commonly known as:  PRINIVIL,ZESTRIL  Take 1 tablet (10 mg total) by mouth daily.     TRUE METRIX METER w/Device Kit  37 Units/day by Does not apply route 4 (four) times daily.     TRUEPLUS LANCETS 28G Misc  37 Units by Does not apply route 4 (four) times daily.     Vitamin D (Ergocalciferol) 50000 units Caps capsule  Commonly known as:  DRISDOL  Take 1 capsule (50,000 Units total) by mouth every 7 (seven) days.        Filed Vitals:   10/23/15 0930 10/23/15 1313  BP: 139/64 170/89  Pulse: 80   Temp: 100.1 F (37.8 C)   Resp: 16     Skin clean, dry and intact without evidence of skin break down, no evidence of skin tears noted. IV catheter discontinued intact. Site without signs and symptoms of complications. Dressing and pressure applied. Pt denies pain at this time. No complaints noted.  An After Visit Summary was printed and given to the patient. Patient escorted via [place], and D/C home via private auto.  Analiz Tvedt A 10/23/2015 1:16 PM

## 2015-10-23 NOTE — Plan of Care (Signed)
Problem: Activity: Goal: Risk for activity intolerance will decrease Outcome: Completed/Met Date Met:  10/23/15 Patient up ad lib without difficulty.

## 2015-10-24 MED FILL — ?LISINOPRIL 10 MG TABLET: 10 | 30 days supply | Qty: 30 | Fill #0

## 2015-10-26 ENCOUNTER — Encounter: Payer: Self-pay | Admitting: Internal Medicine

## 2015-10-26 ENCOUNTER — Ambulatory Visit: Payer: Self-pay | Attending: Internal Medicine | Admitting: Internal Medicine

## 2015-10-26 VITALS — BP 122/82 | HR 89 | Temp 98.5°F | Resp 16 | Ht 62.0 in | Wt 156.8 lb

## 2015-10-26 DIAGNOSIS — E1065 Type 1 diabetes mellitus with hyperglycemia: Secondary | ICD-10-CM

## 2015-10-26 DIAGNOSIS — IMO0002 Reserved for concepts with insufficient information to code with codable children: Secondary | ICD-10-CM

## 2015-10-26 DIAGNOSIS — E1029 Type 1 diabetes mellitus with other diabetic kidney complication: Secondary | ICD-10-CM

## 2015-10-26 LAB — GLUCOSE, POCT (MANUAL RESULT ENTRY): POC Glucose: 83 mg/dl (ref 70–99)

## 2015-10-26 NOTE — Progress Notes (Signed)
Patient here for follow up on her diabetes and virus that she was seen for last week

## 2015-10-26 NOTE — Progress Notes (Signed)
Patient ID: Cathy Green, female   DOB: 11-24-1980, 35 y.o.   MRN: [id number] SUBJECTIVE: 35 y.o. female for follow up of diabetes. Patient was evaluated here last week for nausea, vomiting, and hyperglycemia. She was later admitted into hospital for dehydration and renal insufficiency. She states that since being discharged she now feels better and has been taking insulin as directed. She presents with a sugar log that reveals readings from 103 to 244. She notes some neuropathy in BLE. Polyuria and blurred vision have improved since discharge. She states that her last eye exam was 3 months ago and her last pap was 1.5 years ago. She up to date on immunizations.  Current Outpatient Prescriptions  Medication Sig Dispense Refill  . insulin aspart (NOVOLOG) 100 UNIT/ML injection Inject 0-10 Units into the skin 3 (three) times daily with meals. 30 mL 3  . insulin NPH Human (HUMULIN N,NOVOLIN N) 100 UNIT/ML injection Inject 0.37 mLs (37 Units total) into the skin 2 (two) times daily. 30 mL 3  . lisinopril (PRINIVIL,ZESTRIL) 10 MG tablet Take 1 tablet (10 mg total) by mouth daily. 30 tablet 3  . Blood Glucose Monitoring Suppl (TRUE METRIX METER) w/Device KIT 37 Units/day by Does not apply route 4 (four) times daily. 100 kit 3  . glucose blood (TRUE METRIX BLOOD GLUCOSE TEST) test strip Use as instructed 100 each 12  . TRUEPLUS LANCETS 28G MISC 37 Units by Does not apply route 4 (four) times daily. 100 each 3  . Vitamin D, Ergocalciferol, (DRISDOL) 50000 UNITS CAPS capsule Take 1 capsule (50,000 Units total) by mouth every 7 (seven) days. (Patient not taking: Reported on 10/18/2015) 12 capsule 0   No current facility-administered medications for this visit.  Review of Systems: Other than what is stated in HPI, all other systems are negative.   OBJECTIVE: Appearance: alert, well appearing, and in no distress, oriented to person, place, and time and normal appearing weight. BP 122/82 mmHg  Pulse 89   Temp(Src) 98.5 F (36.9 C)  Resp 16  Ht '5\' 2"'  (1.575 m)  Wt 156 lb 12.8 oz (71.124 kg)  BMI 28.67 kg/m2  SpO2 100%  LMP 10/13/2015  Physical Exam  Constitutional: She is oriented to person, place, and time.  Cardiovascular: Normal rate, regular rhythm and normal heart sounds.   Pulses:      Dorsalis pedis pulses are 2+ on the right side, and 2+ on the left side.       Posterior tibial pulses are 2+ on the right side, and 2+ on the left side.  Pulmonary/Chest: Effort normal and breath sounds normal.  Abdominal: There is no tenderness.  Musculoskeletal: She exhibits no edema.  Feet:  Right Foot:  Protective Sensation: 10 sites tested.10 sites sensed. Skin Integrity: Negative for skin breakdown.  Left Foot:  Protective Sensation: 10 sites tested. 10 sites sensed. Skin Integrity: Negative for skin breakdown.  Neurological: She is alert and oriented to person, place, and time.  Skin: Skin is warm and dry.  Psychiatric: She has a normal mood and affect.    ASSESSMENT: Cathy Green was seen today for follow-up.  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 Encouraged patient to keep up good work and her A1C will improve on next check. She will  come back in 1 week foe a repeat bmet to assess kidney function  Return for 1 week lab visit and 3 mo PCP DM.  Lance Bosch, NP 10/26/2015 6:49 PM

## 2015-10-26 NOTE — Patient Instructions (Signed)
Only take 5 mg of the Lisinopril and check pressures to make sure it is not dropping too low. If you start to feel dizzy with medication call me.

## 2015-10-27 LAB — MICROALBUMIN, URINE: MICROALB UR: 396.1 mg/dL

## 2015-11-01 ENCOUNTER — Telehealth: Payer: Self-pay

## 2015-11-01 ENCOUNTER — Other Ambulatory Visit: Payer: Self-pay | Admitting: Internal Medicine

## 2015-11-01 DIAGNOSIS — R809 Proteinuria, unspecified: Secondary | ICD-10-CM | POA: Insufficient documentation

## 2015-11-01 NOTE — Telephone Encounter (Signed)
Called patient this am Patient not available Message left on voice mail to return our call 

## 2015-11-01 NOTE — Telephone Encounter (Signed)
-----   Message from Lance Bosch, NP sent at 11/01/2015  7:32 AM EST ----- She has way too much protein in her urine as a diabetic. This is going to place her at risk for kidney damage. I would really like to maximize her lisinopril but I am not sure if she can tolerate it. When she returns for blood test have her to get with stacy and check her pressure to see if she can tolerate the increase. The best thing she can do right now is to make sure she gains strict control over her sugars to prevent complications

## 2015-11-02 ENCOUNTER — Ambulatory Visit: Payer: Self-pay | Attending: Internal Medicine

## 2015-11-02 DIAGNOSIS — E1029 Type 1 diabetes mellitus with other diabetic kidney complication: Secondary | ICD-10-CM

## 2015-11-02 DIAGNOSIS — IMO0002 Reserved for concepts with insufficient information to code with codable children: Secondary | ICD-10-CM

## 2015-11-02 DIAGNOSIS — E1065 Type 1 diabetes mellitus with hyperglycemia: Principal | ICD-10-CM

## 2015-11-02 LAB — BASIC METABOLIC PANEL
BUN: 22 mg/dL (ref 7–25)
CHLORIDE: 104 mmol/L (ref 98–110)
CO2: 28 mmol/L (ref 20–31)
Calcium: 8.9 mg/dL (ref 8.6–10.2)
Creat: 1.97 mg/dL — ABNORMAL HIGH (ref 0.50–1.10)
Glucose, Bld: 116 mg/dL — ABNORMAL HIGH (ref 65–99)
POTASSIUM: 5.8 mmol/L — AB (ref 3.5–5.3)
Sodium: 140 mmol/L (ref 135–146)

## 2015-11-03 ENCOUNTER — Telehealth: Payer: Self-pay

## 2015-11-03 ENCOUNTER — Other Ambulatory Visit: Payer: Self-pay | Admitting: Internal Medicine

## 2015-11-03 DIAGNOSIS — R799 Abnormal finding of blood chemistry, unspecified: Secondary | ICD-10-CM

## 2015-11-03 DIAGNOSIS — N179 Acute kidney failure, unspecified: Secondary | ICD-10-CM | POA: Insufficient documentation

## 2015-11-03 DIAGNOSIS — E875 Hyperkalemia: Secondary | ICD-10-CM

## 2015-11-03 MED ORDER — SODIUM POLYSTYRENE SULFONATE 15 GM/60ML PO SUSP: 30.0000 g | Freq: Once | ORAL | Status: DC

## 2015-11-03 MED FILL — KIONEX 15 GM/60 ML SUS: 15 | 1 days supply | Qty: 120 | Fill #0

## 2015-11-03 NOTE — Telephone Encounter (Signed)
Spoke with patient and she is aware her potassium was elevated And to pick up RX for medication at pharmacy Also to come back in two weeks for repeat lab work

## 2015-11-14 MED FILL — TRUE METRIX TEST STRIP: 25 days supply | Qty: 100 | Fill #1

## 2015-11-17 ENCOUNTER — Ambulatory Visit: Payer: Self-pay | Attending: Internal Medicine

## 2015-11-17 DIAGNOSIS — E875 Hyperkalemia: Secondary | ICD-10-CM

## 2015-11-17 DIAGNOSIS — N179 Acute kidney failure, unspecified: Secondary | ICD-10-CM

## 2015-11-17 LAB — COMPLETE METABOLIC PANEL WITH GFR
ALBUMIN: 3.3 g/dL — AB (ref 3.6–5.1)
ALK PHOS: 59 U/L (ref 33–115)
ALT: 10 U/L (ref 6–29)
AST: 12 U/L (ref 10–30)
BILIRUBIN TOTAL: 0.3 mg/dL (ref 0.2–1.2)
BUN: 33 mg/dL — AB (ref 7–25)
CO2: 25 mmol/L (ref 20–31)
CREATININE: 1.99 mg/dL — AB (ref 0.50–1.10)
Calcium: 9 mg/dL (ref 8.6–10.2)
Chloride: 105 mmol/L (ref 98–110)
GFR, Est African American: 37 mL/min — ABNORMAL LOW (ref >=60)
GFR, Est Non African American: 32 mL/min — ABNORMAL LOW (ref >=60)
GLUCOSE: 98 mg/dL (ref 65–99)
Potassium: 4.9 mmol/L (ref 3.5–5.3)
SODIUM: 137 mmol/L (ref 135–146)
TOTAL PROTEIN: 6.4 g/dL (ref 6.1–8.1)

## 2015-11-18 ENCOUNTER — Telehealth: Payer: Self-pay

## 2015-11-18 NOTE — Telephone Encounter (Signed)
Called patient this am  Patient not available Left message with family member to  Have her return our call

## 2015-11-18 NOTE — Telephone Encounter (Signed)
-----   Message from Lance Bosch, NP sent at 11/18/2015  9:21 AM EST ----- Potassium is back normal. Patient will need a referral to Nephrology, her kidney's are not improving. Please explain that if she does not have insurance she will need to pay out of pocket but they will allow her to be on a payment plan.

## 2015-11-28 ENCOUNTER — Telehealth: Payer: Self-pay | Admitting: Pharmacist

## 2015-11-28 NOTE — Telephone Encounter (Signed)
Called patient to schedule an appointment with me for possible titration of lisinopril due to proteinuria. Patient was not available so left a HIPAA compliant message for her to call and schedule an appointment with me.   She is going to be scheduled with nephrology for continued renal dysfunction but would still benefit from lisinopril titration if she can tolerate it.

## 2015-12-12 MED FILL — NovoLIN N 100 UNIT/ML SUSP: 100 | 27 days supply | Qty: 20 | Fill #1

## 2015-12-12 MED FILL — TRUE METRIX TEST STRIP: 25 days supply | Qty: 100 | Fill #2

## 2015-12-27 ENCOUNTER — Encounter: Payer: Self-pay | Admitting: Internal Medicine

## 2015-12-27 ENCOUNTER — Ambulatory Visit: Payer: Self-pay | Attending: Internal Medicine | Admitting: Internal Medicine

## 2015-12-27 ENCOUNTER — Telehealth: Payer: Self-pay

## 2015-12-27 VITALS — BP 120/87 | HR 88 | Ht 62.0 in

## 2015-12-27 DIAGNOSIS — H409 Unspecified glaucoma: Secondary | ICD-10-CM | POA: Insufficient documentation

## 2015-12-27 DIAGNOSIS — Z794 Long term (current) use of insulin: Secondary | ICD-10-CM | POA: Insufficient documentation

## 2015-12-27 DIAGNOSIS — N189 Chronic kidney disease, unspecified: Secondary | ICD-10-CM | POA: Insufficient documentation

## 2015-12-27 DIAGNOSIS — Z79899 Other long term (current) drug therapy: Secondary | ICD-10-CM | POA: Insufficient documentation

## 2015-12-27 DIAGNOSIS — R197 Diarrhea, unspecified: Secondary | ICD-10-CM | POA: Insufficient documentation

## 2015-12-27 DIAGNOSIS — IMO0002 Reserved for concepts with insufficient information to code with codable children: Secondary | ICD-10-CM

## 2015-12-27 DIAGNOSIS — E1122 Type 2 diabetes mellitus with diabetic chronic kidney disease: Secondary | ICD-10-CM | POA: Insufficient documentation

## 2015-12-27 DIAGNOSIS — E1029 Type 1 diabetes mellitus with other diabetic kidney complication: Secondary | ICD-10-CM

## 2015-12-27 DIAGNOSIS — I129 Hypertensive chronic kidney disease with stage 1 through stage 4 chronic kidney disease, or unspecified chronic kidney disease: Secondary | ICD-10-CM | POA: Insufficient documentation

## 2015-12-27 DIAGNOSIS — R1110 Vomiting, unspecified: Secondary | ICD-10-CM | POA: Insufficient documentation

## 2015-12-27 DIAGNOSIS — E785 Hyperlipidemia, unspecified: Secondary | ICD-10-CM | POA: Insufficient documentation

## 2015-12-27 DIAGNOSIS — A084 Viral intestinal infection, unspecified: Secondary | ICD-10-CM | POA: Insufficient documentation

## 2015-12-27 DIAGNOSIS — E1065 Type 1 diabetes mellitus with hyperglycemia: Secondary | ICD-10-CM | POA: Insufficient documentation

## 2015-12-27 DIAGNOSIS — I10 Essential (primary) hypertension: Secondary | ICD-10-CM | POA: Insufficient documentation

## 2015-12-27 LAB — GLUCOSE, POCT (MANUAL RESULT ENTRY): POC Glucose: 133 mg/dl — AB (ref 70–99)

## 2015-12-27 MED ORDER — ONDANSETRON HCL 4 MG PO TABS
4.0000 mg | ORAL_TABLET | Freq: Three times a day (TID) | ORAL | Status: DC | PRN
Start: 2015-12-27 — End: 2017-06-17

## 2015-12-27 MED ORDER — PROMETHAZINE HCL 25 MG/ML IJ SOLN: 25.0000 mg | Freq: Once | INTRAMUSCULAR | Status: DC

## 2015-12-27 NOTE — Progress Notes (Signed)
Per N.P. Chari Manning verbal order this RN gave 25 mg IV push of phenergan.  Unable to document administration or enter order due to phenergan on national backorder.  Patient tolerated well.

## 2015-12-27 NOTE — Patient Instructions (Signed)

## 2015-12-27 NOTE — Telephone Encounter (Signed)
Returned patient phone call Patient has been vomiting and having some diarrhea Patient would like to try to be seen Called transferred to the front desk to see about availability

## 2015-12-27 NOTE — Progress Notes (Signed)
Patient c/o vomiting and diarrhea x5 days ago.   Patient states she's been having feeling of nausea and feeling of faint.  Patient states she haven't taken her meds today.

## 2015-12-27 NOTE — Progress Notes (Signed)
Patient ID: Cathy Green, female   DOB: 08-22-1981, 35 y.o.   MRN: [id number]  CC: diarrhea, vomiting  HPI: Cathy Green is a 35 y.o. female here today for a follow up visit.  Patient has past medical history of diabetes, HTN, HLD, CKD, and glaucoma. Patient reports that 5 days ago she began to have symptoms of diarrhea, vomiting, and nausea. Diarrhea has now resolved but vomiting. Has taken Imodium which resolved diarrhea 3 days ago. She has continued to vomit 3 times in the past 2 days that have been excessive. She has been able to drink water but unable to hold down any fluids. Patient has glucometer with her that reveal sugar levels of 120 and less.  No Known Allergies Past Medical History  Diagnosis Date  . Abnormal Pap smear   . Diabetes mellitus     diagnosed age 79  . Hypertension   . Hyperlipidemia   . Chronic kidney disease   . Migraines   . Anemia   . Allergy     seasonal allergies  . Glaucoma   . Noncompliance   . Numbness in left leg    Current Outpatient Prescriptions on File Prior to Visit  Medication Sig Dispense Refill  . Blood Glucose Monitoring Suppl (TRUE METRIX METER) w/Device KIT 37 Units/day by Does not apply route 4 (four) times daily. 100 kit 3  . glucose blood (TRUE METRIX BLOOD GLUCOSE TEST) test strip Use as instructed 100 each 12  . insulin aspart (NOVOLOG) 100 UNIT/ML injection Inject 0-10 Units into the skin 3 (three) times daily with meals. 30 mL 3  . insulin NPH Human (HUMULIN N,NOVOLIN N) 100 UNIT/ML injection Inject 0.37 mLs (37 Units total) into the skin 2 (two) times daily. 30 mL 3  . lisinopril (PRINIVIL,ZESTRIL) 10 MG tablet Take 1 tablet (10 mg total) by mouth daily. 30 tablet 3  . sodium polystyrene (KAYEXALATE) 15 GM/60ML suspension Take 120 mLs (30 g total) by mouth once. 30 mL 0  . TRUEPLUS LANCETS 28G MISC 37 Units by Does not apply route 4 (four) times daily. 100 each 3   No current facility-administered medications on file prior to  visit.   Family History  Problem Relation Age of Onset  . Cancer Maternal Grandfather     prostate  . Hypertension Mother   . Stroke Mother   . Cancer Other   . COPD Other   . Hyperlipidemia Other   . Hypertension Father    Social History   Social History  . Marital Status: Single    Spouse Name: N/A  . Number of Children: N/A  . Years of Education: N/A   Occupational History  . Not on file.   Social History Main Topics  . Smoking status: Never Smoker   . Smokeless tobacco: Never Used  . Alcohol Use: Yes     Comment: rare  . Drug Use: No  . Sexual Activity: No   Other Topics Concern  . Not on file   Social History Narrative    Review of Systems  Constitutional: Positive for malaise/fatigue.  Gastrointestinal: Positive for nausea, vomiting and diarrhea. Negative for abdominal pain.  Neurological: Positive for dizziness and weakness.  All other systems reviewed and are negative.   Objective:   Filed Vitals:   12/27/15 1049 12/27/15 1050  BP: 122/84 120/87  Pulse: 91 88    Physical Exam  Constitutional: She is oriented to person, place, and time.  Cardiovascular: Normal rate, regular rhythm and normal  heart sounds.   Pulmonary/Chest: Effort normal and breath sounds normal.  Abdominal:  Hyperactive bowel sounds  Neurological: She is alert and oriented to person, place, and time.  Skin: Skin is warm and dry.  Psychiatric: She has a normal mood and affect.     Lab Results  Component Value Date   WBC 7.5 10/22/2015   HGB 10.6* 10/22/2015   HCT 32.4* 10/22/2015   MCV 81.6 10/22/2015   PLT 252 10/22/2015   Lab Results  Component Value Date   CREATININE 1.99* 11/17/2015   BUN 33* 11/17/2015   NA 137 11/17/2015   K 4.9 11/17/2015   CL 105 11/17/2015   CO2 25 11/17/2015    Lab Results  Component Value Date   HGBA1C 13.30 10/18/2015   Lipid Panel     Component Value Date/Time   CHOL 290* 06/09/2014 1604   TRIG 100 06/09/2014 1604   HDL 59  06/09/2014 1604   CHOLHDL 4.9 06/09/2014 1604   VLDL 20 06/09/2014 1604   LDLCALC 211* 06/09/2014 1604       Assessment and plan:   Cathy Green was seen today for emesis and diarrhea.  Diagnoses and all orders for this visit:  Viral gastroenteritis -     ondansetron (ZOFRAN) 4 MG tablet; Take 1 tablet (4 mg total) by mouth every 8 (eight) hours as needed for nausea or vomiting. -     Insert peripheral IV -     promethazine (PHENERGAN) 25 MG/ML injection; Inject 1 mL (25 mg total) into the vein once in office. Patient advised to stay hydrated. 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       Lance Bosch, [facility] [phone] 12/27/2015, 10:43 AM

## 2015-12-28 LAB — MICROALBUMIN / CREATININE URINE RATIO
CREATININE, URINE: 62 mg/dL (ref 20–320)
MICROALB UR: 79.7 mg/dL
Microalb Creat Ratio: 1285 mcg/mg creat — ABNORMAL HIGH (ref <30)

## 2015-12-29 ENCOUNTER — Telehealth: Payer: Self-pay | Admitting: Internal Medicine

## 2015-12-29 ENCOUNTER — Telehealth: Payer: Self-pay

## 2015-12-29 NOTE — Telephone Encounter (Signed)
Tried to contact patient about her lab results\ Patient not available Message left with family member to return our call

## 2015-12-29 NOTE — Telephone Encounter (Signed)
-----   Message from Lance Bosch, NP sent at 12/29/2015  9:07 AM EDT ----- She has a lot of protein in her urine. Make sure she is taking Lisinopril daily and sugars are well controlled.

## 2015-12-29 NOTE — Telephone Encounter (Signed)
Returned patient phone call Patient not available Message left with family member to return our call

## 2015-12-29 NOTE — Telephone Encounter (Signed)
Patient returned nurse phone call. Please follow up.  Patient best reached on cell phone.

## 2016-01-09 MED FILL — ?LISINOPRIL 10 MG TABLET: 10 | 30 days supply | Qty: 30 | Fill #1

## 2016-01-09 MED FILL — TRUE METRIX TEST STRIP: 25 days supply | Qty: 100 | Fill #3

## 2016-02-06 ENCOUNTER — Other Ambulatory Visit: Payer: Self-pay | Admitting: Internal Medicine

## 2016-02-06 MED FILL — !NOVOLOG 100UNITS/ML VIAL: 100/ML | 30 days supply | Qty: 30 | Fill #0

## 2016-02-06 MED FILL — TRUE METRIX TEST STRIP: 25 days supply | Qty: 100 | Fill #4

## 2016-02-06 MED FILL — NovoLIN N 100 UNIT/ML SUSP: 100 | 27 days supply | Qty: 20 | Fill #2

## 2016-02-13 ENCOUNTER — Other Ambulatory Visit: Payer: Self-pay | Admitting: Internal Medicine

## 2016-02-13 DIAGNOSIS — E119 Type 2 diabetes mellitus without complications: Secondary | ICD-10-CM

## 2016-02-13 DIAGNOSIS — Z794 Long term (current) use of insulin: Principal | ICD-10-CM

## 2016-02-13 MED ORDER — INSULIN NPH (HUMAN) (ISOPHANE) 100 UNIT/ML ~~LOC~~ SUSP: 37.0000 [IU] | Freq: Two times a day (BID) | SUBCUTANEOUS | Status: DC

## 2016-03-05 MED FILL — TRUE METRIX TEST STRIP: 25 days supply | Qty: 100 | Fill #5

## 2016-03-14 ENCOUNTER — Other Ambulatory Visit: Payer: Self-pay | Admitting: *Deleted

## 2016-03-14 MED ORDER — INSULIN ASPART 100 UNIT/ML ~~LOC~~ SOLN: SUBCUTANEOUS | Status: DC

## 2016-03-14 NOTE — Telephone Encounter (Signed)
MAPS PROGRAM 

## 2016-03-23 ENCOUNTER — Ambulatory Visit (HOSPITAL_COMMUNITY)
Admission: RE | Admit: 2016-03-23 | Discharge: 2016-03-23 | Disposition: A | Discharge status: Home / Self Care | Payer: Self-pay | Source: Ambulatory Visit | Attending: Physician Assistant | Admitting: Physician Assistant

## 2016-03-23 ENCOUNTER — Ambulatory Visit: Payer: Self-pay | Attending: Internal Medicine | Admitting: Physician Assistant

## 2016-03-23 ENCOUNTER — Encounter: Payer: Self-pay | Admitting: Physician Assistant

## 2016-03-23 VITALS — BP 161/107 | HR 80 | Temp 98.2°F | Wt 160.0 lb

## 2016-03-23 DIAGNOSIS — R202 Paresthesia of skin: Secondary | ICD-10-CM

## 2016-03-23 DIAGNOSIS — M545 Low back pain, unspecified: Secondary | ICD-10-CM

## 2016-03-23 DIAGNOSIS — I10 Essential (primary) hypertension: Secondary | ICD-10-CM

## 2016-03-23 DIAGNOSIS — E1029 Type 1 diabetes mellitus with other diabetic kidney complication: Secondary | ICD-10-CM

## 2016-03-23 DIAGNOSIS — M62838 Other muscle spasm: Secondary | ICD-10-CM

## 2016-03-23 DIAGNOSIS — E1065 Type 1 diabetes mellitus with hyperglycemia: Secondary | ICD-10-CM

## 2016-03-23 DIAGNOSIS — M79605 Pain in left leg: Secondary | ICD-10-CM

## 2016-03-23 DIAGNOSIS — IMO0002 Reserved for concepts with insufficient information to code with codable children: Secondary | ICD-10-CM

## 2016-03-23 LAB — POCT GLYCOSYLATED HEMOGLOBIN (HGB A1C): HEMOGLOBIN A1C: 5.9

## 2016-03-23 LAB — GLUCOSE, POCT (MANUAL RESULT ENTRY): POC GLUCOSE: 98 mg/dL (ref 70–99)

## 2016-03-23 MED ORDER — IBUPROFEN 800 MG PO TABS: 800.0000 mg | ORAL_TABLET | Freq: Three times a day (TID) | ORAL | Status: DC | PRN

## 2016-03-23 MED ORDER — METHOCARBAMOL 750 MG PO TABS: 750.0000 mg | ORAL_TABLET | Freq: Four times a day (QID) | ORAL | Status: DC

## 2016-03-23 MED FILL — METHOCARBAMOL 750 MG TABLET: 750 | 30 days supply | Qty: 90 | Fill #0

## 2016-03-23 MED FILL — IBUPROFEN 800 MG TABLET: 800 | 10 days supply | Qty: 30 | Fill #0

## 2016-03-23 NOTE — Progress Notes (Signed)
Cathy Green, is a 35 y.o. female c/o LBP and L leg pain  OEU:[id number]  XVQ:[id number]  DOB - 06-17-1981  Chief Complaint  Patient presents with  . Hip Pain    Left x 1 mth  . Numbness    Left foot - Big Toe and 2 nd toe        Subjective:  Chief Complaint and HPI: Cathy Green is a 35 y.o. female here today for lower back pain and L leg pain for about 3 weeks.  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  The pain is in her LLB and radiates into the L leg.  She has also noticed some numbness in her L great and 2nd toe intermittently.  She denies obvious injury.  Tylenol hasn't helped the pain.  Blood sugars controlled.  Compliant with diabetes med. No s/sx of hypoglycemia.   She isn't sure if she is taking Lisinopril or not.  She said she is taking a half tablet of something at night and she thinks it might be Lisinopril.  She denies CP, vision changes, or HAs   ROS:   Constitutional:  No f/c, No night sweats, No unexplained weight loss. EENT:  No vision changes, No blurry vision, No hearing changes. No mouth, throat, or ear problems.  Respiratory: No cough, No SOB Cardiac: No CP, no palpitations GI:  No abd pain, No N/V/D. GU: No Urinary s/sx Musculoskeletal: No joint pain, + left leg pain Neuro: No headache, no dizziness, no motor weakness.  Skin: No rash Endocrine:  No polydipsia. No polyuria.  Psych: Denies SI/HI  No problems updated.  ALLERGIES: No Known Allergies  PAST MEDICAL HISTORY: Past Medical History  Diagnosis Date  . Abnormal Pap smear   . Diabetes mellitus     diagnosed age 11  . Hypertension   . Hyperlipidemia   . Chronic kidney disease   . Migraines   . Anemia   . Allergy     seasonal allergies  . Glaucoma   . Noncompliance   . Numbness in left leg     MEDICATIONS AT HOME: Prior to Admission medications   Medication Sig Start Date End Date Taking? Authorizing Provider  Blood Glucose Monitoring Suppl  (TRUE METRIX METER) w/Device KIT 37 Units/day by Does not apply route 4 (four) times daily. 10/18/15  Yes Tiffany Daneil Dan, PA-C  glucose blood (TRUE METRIX BLOOD GLUCOSE TEST) test strip Use as instructed 10/18/15  Yes Tiffany S Noel, PA-C  glucose blood test strip 1 each by Other route daily. 03/05/16  Yes Historical Provider, MD  insulin aspart (NOVOLOG) 100 UNIT/ML injection INJECT 0 TO 10 UNITS UNDER THE SKIN 3 TIMES A DAY WITH MEALS 03/14/16  Yes Olugbemiga E Doreene Burke, MD  insulin NPH Human (HUMULIN N,NOVOLIN N) 100 UNIT/ML injection Inject 0.37 mLs (37 Units total) into the skin 2 (two) times daily. 02/13/16  Yes Tresa Garter, MD  ibuprofen (ADVIL,MOTRIN) 800 MG tablet Take 1 tablet (800 mg total) by mouth every 8 (eight) hours as needed. 03/23/16   Argentina Donovan, PA-C  lisinopril (PRINIVIL,ZESTRIL) 10 MG tablet Take 1 tablet (10 mg total) by mouth daily. Patient not taking: Reported on 03/23/2016 10/23/15   Verlee Monte, MD  methocarbamol (ROBAXIN) 750 MG tablet Take 1 tablet (750 mg total) by mouth 4 (four) times daily. X 7 days then prn muscle spasm 03/23/16   Argentina Donovan, PA-C  ondansetron (ZOFRAN) 4 MG tablet Take  1 tablet (4 mg total) by mouth every 8 (eight) hours as needed for nausea or vomiting. 12/27/15   Lance Bosch, NP  promethazine (PHENERGAN) 25 MG/ML injection Inject 1 mL (25 mg total) into the vein once. 12/27/15   Lance Bosch, NP  sodium polystyrene (KAYEXALATE) 15 GM/60ML suspension Take 120 mLs (30 g total) by mouth once. 11/03/15   Lance Bosch, NP  TRUEPLUS LANCETS 28G MISC 37 Units by Does not apply route 4 (four) times daily. 10/18/15   Brayton Caves, PA-C     Objective:  EXAM:   Filed Vitals:   03/23/16 1021  BP: 161/107  Pulse: 80  Temp: 98.2 F (36.8 C)  TempSrc: Oral  Weight: 160 lb (72.576 kg)    General appearance : A&OX3. NAD. Non-toxic-appearing.  She is limpng favoring her R side when she walks HEENT: Atraumatic and Normocephalic.  PERRLA. EOM  intact.   Neck: supple, no JVD. No cervical lymphadenopathy. No thyromegaly Chest/Lungs:  Breathing-non-labored, Good air entry bilaterally, breath sounds normal without rales, rhonchi, or wheezing  CVS: S1 S2 regular, no murmurs, gallops, rubs  Abdomen: Bowel sounds present, Non tender and not distended with no gaurding, rigidity or rebound. Extremities: Bilateral Lower Ext shows no edema, both legs are warm to touch with = pulse throughout.  Lower ext DTR=B.  TTP over L SI joint.  +paraspinus muscle spasm L>R in lower back Neurology:  CN II-XII grossly intact, Non focal.  +/- SLR on L.  Babinski negative B Psych:  TP linear. J/I WNL. Normal speech. Appropriate eye contact and affect.  Skin:  No Rash  Data Review Lab Results  Component Value Date   HGBA1C 5.9 03/23/2016   HGBA1C 13.30 10/18/2015   HGBA1C 11.40 05/12/2015     Assessment & Plan   1. Low back pain radiating to left leg - DG Lumbar Spine 2-3 Views; Future(today) - ibuprofen (ADVIL,MOTRIN) 800 MG tablet; Take 1 tablet (800 mg total) by mouth every 8 (eight) hours as needed.  Dispense: 30 tablet; Refill: 0 - methocarbamol (ROBAXIN) 750 MG tablet; Take 1 tablet (750 mg total) by mouth 4 (four) times daily. X 7 days then prn muscle spasm  Dispense: 90 tablet; Refill: 0  2. 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  Continue current regimen.  - POCT glycosylated hemoglobin (Hb A1C) - POCT glucose (manual entry)  3. Paresthesias - DG Lumbar Spine 2-3 Views; Future  4. Muscle spasm - methocarbamol (ROBAXIN) 750 MG tablet; Take 1 tablet (750 mg total) by mouth 4 (four) times daily. X 7 days then prn muscle spasm  Dispense: 90 tablet; Refill: 0  5. Essential hypertension-uncontrolled Currently, she thinks she is taking 1/2 Lisinopril tab daily.  She is advised to take a whole Lisinopril (55m daily) and check BP daily OOO.  Bring log to next visit.   Patient  have been counseled extensively about nutrition and exercise  Return in about 2 weeks (around 04/06/2016) for f/up with me in 2 weeks to recheck LBP and BP.  The patient was given clear instructions to go to ER or return to medical center if symptoms don't improve, worsen or new problems develop. The patient verbalized understanding. The patient was told to call to get lab results if they haven't heard anything in the next week.     AFreeman Caldron PA-C [facility] [place]  03/23/2016, 11:47 AM

## 2016-04-09 MED FILL — TRUE METRIX TEST STRIP: 25 days supply | Qty: 100 | Fill #6

## 2016-04-13 ENCOUNTER — Ambulatory Visit: Payer: Self-pay | Attending: Physician Assistant | Admitting: Physician Assistant

## 2016-04-13 VITALS — BP 136/78 | HR 87 | Temp 98.3°F | Resp 16 | Wt 158.8 lb

## 2016-04-13 DIAGNOSIS — M5442 Lumbago with sciatica, left side: Secondary | ICD-10-CM

## 2016-04-13 DIAGNOSIS — E1022 Type 1 diabetes mellitus with diabetic chronic kidney disease: Secondary | ICD-10-CM

## 2016-04-13 DIAGNOSIS — I10 Essential (primary) hypertension: Secondary | ICD-10-CM

## 2016-04-13 DIAGNOSIS — E1029 Type 1 diabetes mellitus with other diabetic kidney complication: Secondary | ICD-10-CM | POA: Insufficient documentation

## 2016-04-13 NOTE — Progress Notes (Signed)
Pt is in the office today for lower back pain and hypertension Pain level today in the office is a 4 Pt stated she takes her bp medicine at night

## 2016-04-13 NOTE — Progress Notes (Signed)
Cathy Green, is a 35 y.o. female  LKT:[id number]  DSK:[id number]  DOB - Jun 01, 1981  Chief Complaint  Patient presents with  . Back Pain  . Hypertension        Subjective:  Chief Complaint and HPI: Cathy Green is a 35 y.o. female here today for a f/up of LBP with L sciatica.  Her pain is much improved.  The lumbar xrays I sent her for were negative.  She has been checking her BP at home and it has been 120-130s/80-low 90s at home.  Blood sugars <120.  No s/sx of hyper/hypoglycemia.  Currently taking Lisinopril 24m daily for BP. History of elevated Creatnine when diabetes was uncontrolled but improved with glycemic improvement.   ROS:   Constitutional:  No f/c, No night sweats, No unexplained weight loss. EENT:  No vision changes, No blurry vision, No hearing changes. No mouth, throat, or ear problems.  Respiratory: No cough, No SOB Cardiac: No CP, no palpitations GI:  No abd pain, No N/V/D. GU: No Urinary s/sx Musculoskeletal: +improving back pain Neuro: No headache, no dizziness, no motor weakness.  Skin: No rash Endocrine:  No polydipsia. No polyuria.  Psych: Denies SI/HI  No problems updated.  ALLERGIES: No Known Allergies  PAST MEDICAL HISTORY: Past Medical History  Diagnosis Date  . Abnormal Pap smear   . Diabetes mellitus     diagnosed age 35 . Hypertension   . Hyperlipidemia   . Chronic kidney disease   . Migraines   . Anemia   . Allergy     seasonal allergies  . Glaucoma   . Noncompliance   . Numbness in left leg     MEDICATIONS AT HOME: Prior to Admission medications   Medication Sig Start Date End Date Taking? Authorizing Provider  Blood Glucose Monitoring Suppl (TRUE METRIX METER) w/Device KIT 37 Units/day by Does not apply route 4 (four) times daily. 10/18/15  Yes Tiffany SDaneil Dan PA-C  glucose blood (TRUE METRIX BLOOD GLUCOSE TEST) test strip Use as instructed 10/18/15  Yes Tiffany S Noel, PA-C  glucose blood test strip 1 each by Other  route daily. 03/05/16  Yes Historical Provider, MD  insulin aspart (NOVOLOG) 100 UNIT/ML injection INJECT 0 TO 10 UNITS UNDER THE SKIN 3 TIMES A DAY WITH MEALS 03/14/16  Yes Olugbemiga E JDoreene Burke MD  insulin NPH Human (HUMULIN N,NOVOLIN N) 100 UNIT/ML injection Inject 0.37 mLs (37 Units total) into the skin 2 (two) times daily. 02/13/16  Yes OTresa Garter MD  lisinopril (PRINIVIL,ZESTRIL) 10 MG tablet Take 1 tablet (10 mg total) by mouth daily. 10/23/15  Yes MVerlee Monte MD  methocarbamol (ROBAXIN) 750 MG tablet Take 1 tablet (750 mg total) by mouth 4 (four) times daily. X 7 days then prn muscle spasm 03/23/16  Yes ADionne BucyMcClung, PA-C  TRUEPLUS LANCETS 28G MISC 37 Units by Does not apply route 4 (four) times daily. 10/18/15  Yes Tiffany SDaneil Dan PA-C  ibuprofen (ADVIL,MOTRIN) 800 MG tablet Take 1 tablet (800 mg total) by mouth every 8 (eight) hours as needed. Patient not taking: Reported on 04/13/2016 03/23/16   AArgentina Donovan PA-C  ondansetron (ZOFRAN) 4 MG tablet Take 1 tablet (4 mg total) by mouth every 8 (eight) hours as needed for nausea or vomiting. Patient not taking: Reported on 04/13/2016 12/27/15   VLance Bosch NP  promethazine (PHENERGAN) 25 MG/ML injection Inject 1 mL (25 mg total) into the vein once. Patient not taking: Reported on 04/13/2016  12/27/15   Lance Bosch, NP  sodium polystyrene (KAYEXALATE) 15 GM/60ML suspension Take 120 mLs (30 g total) by mouth once. Patient not taking: Reported on 04/13/2016 11/03/15   Lance Bosch, NP     Objective:  EXAM:   Filed Vitals:   04/13/16 1151 04/13/16 1211  BP: 150/97 136/78  Pulse: 87   Temp: 98.3 F (36.8 C)   TempSrc: Oral   Resp: 16   Weight: 158 lb 12.8 oz (72.031 kg)   SpO2: 100%     General appearance : A&OX3. NAD. Non-toxic-appearing HEENT: Atraumatic and Normocephalic.   Neck: supple, no JVD. No cervical lymphadenopathy. No thyromegaly Chest/Lungs:  Breathing-non-labored, Good air entry bilaterally, breath sounds  normal without rales, rhonchi, or wheezing  CVS: S1 S2 regular, no murmurs, gallops, rubs  Extremities: Bilateral Lower Ext shows no edema, both legs are warm to touch with = pulse throughout Neurology:  CN II-XII grossly intact, Non focal.   Psych:  TP linear. J/I WNL. Normal speech. Appropriate eye contact and affect.  Skin:  No Rash  Data Review Lab Results  Component Value Date   HGBA1C 5.9 03/23/2016   HGBA1C 13.30 10/18/2015   HGBA1C 11.40 05/12/2015     Assessment & Plan   1. Essential hypertension Much improved based on readings at home.  Previously elevated Creatnine but this was likely due to uncontrolled diabetes at that time (around January/February 2017).  I will have her come back in for a BMP next week to ensure Creatnine has continued to come down and that Lisinopril is still appropriate.  Discussed with [facility] pharmacist.  Will change to amlodipine if warranted.   2. Controlled type 1 DM Continue with current regimen  3. Left-sided low back pain with left-sided sciatica Much improved.  Back/core exercises recommended as well as stretching  Patient have been counseled extensively about nutrition and exercise  Return in about 3 months (around 07/14/2016).  The patient was given clear instructions to go to ER or return to medical center if symptoms don't improve, worsen or new problems develop. The patient verbalized understanding. The patient was told to call to get lab results if they haven't heard anything in the next week.     Freeman Caldron, PA-C [facility] and [place], [place]   04/13/2016, 2:32 PM

## 2016-04-17 ENCOUNTER — Ambulatory Visit: Payer: Self-pay | Attending: Internal Medicine

## 2016-04-17 DIAGNOSIS — E1022 Type 1 diabetes mellitus with diabetic chronic kidney disease: Secondary | ICD-10-CM | POA: Insufficient documentation

## 2016-04-17 DIAGNOSIS — I10 Essential (primary) hypertension: Secondary | ICD-10-CM | POA: Insufficient documentation

## 2016-04-17 NOTE — Progress Notes (Signed)
Pt is here for lab work only. 

## 2016-04-18 LAB — BASIC METABOLIC PANEL
BUN: 29 mg/dL — AB (ref 7–25)
CALCIUM: 8.6 mg/dL (ref 8.6–10.2)
CO2: 20 mmol/L (ref 20–31)
CREATININE: 2.57 mg/dL — AB (ref 0.50–1.10)
Chloride: 108 mmol/L (ref 98–110)
GLUCOSE: 221 mg/dL — AB (ref 65–99)
Potassium: 4.7 mmol/L (ref 3.5–5.3)
Sodium: 139 mmol/L (ref 135–146)

## 2016-04-20 ENCOUNTER — Telehealth: Payer: Self-pay

## 2016-04-20 NOTE — Telephone Encounter (Signed)
Clld pt - advsd of lab results; no change in medication at this time. Pt stated she understood.

## 2016-04-20 NOTE — Telephone Encounter (Signed)
-----   Message from Argentina Donovan, [place] sent at 04/19/2016  5:31 PM EDT ----- Please call patient and tell her we will keep her on Lisinopril for now.  Her kidney function seems to have been abnormal related to her diabetes when it was uncontrolled earlier in the year.  We will continue to watch this at her follow-up visit in a few months.   Thanks, Freeman Caldron, PA-C

## 2016-06-25 ENCOUNTER — Emergency Department (HOSPITAL_COMMUNITY)
Admission: EM | Admit: 2016-06-25 | Discharge: 2016-06-25 | Disposition: A | Discharge status: Home / Self Care | Payer: Self-pay | Attending: Emergency Medicine | Admitting: Emergency Medicine

## 2016-06-25 ENCOUNTER — Encounter (HOSPITAL_COMMUNITY): Payer: Self-pay

## 2016-06-25 ENCOUNTER — Emergency Department (HOSPITAL_COMMUNITY): Payer: Self-pay

## 2016-06-25 DIAGNOSIS — S82402A Unspecified fracture of shaft of left fibula, initial encounter for closed fracture: Secondary | ICD-10-CM

## 2016-06-25 DIAGNOSIS — Y999 Unspecified external cause status: Secondary | ICD-10-CM | POA: Insufficient documentation

## 2016-06-25 DIAGNOSIS — N189 Chronic kidney disease, unspecified: Secondary | ICD-10-CM | POA: Insufficient documentation

## 2016-06-25 DIAGNOSIS — I129 Hypertensive chronic kidney disease with stage 1 through stage 4 chronic kidney disease, or unspecified chronic kidney disease: Secondary | ICD-10-CM | POA: Insufficient documentation

## 2016-06-25 DIAGNOSIS — Y939 Activity, unspecified: Secondary | ICD-10-CM | POA: Insufficient documentation

## 2016-06-25 DIAGNOSIS — R52 Pain, unspecified: Secondary | ICD-10-CM

## 2016-06-25 DIAGNOSIS — Y92481 Parking lot as the place of occurrence of the external cause: Secondary | ICD-10-CM | POA: Insufficient documentation

## 2016-06-25 DIAGNOSIS — E1122 Type 2 diabetes mellitus with diabetic chronic kidney disease: Secondary | ICD-10-CM | POA: Insufficient documentation

## 2016-06-25 DIAGNOSIS — S82832A Other fracture of upper and lower end of left fibula, initial encounter for closed fracture: Secondary | ICD-10-CM | POA: Insufficient documentation

## 2016-06-25 HISTORY — DX: Type 2 diabetes mellitus without complications: E11.9

## 2016-06-25 MED ORDER — HYDROCODONE-ACETAMINOPHEN 5-325 MG PO TABS
1.0000 | ORAL_TABLET | Freq: Once | ORAL | Status: AC
  Administered 2016-06-25: 1 via ORAL
  Filled 2016-06-25: qty 1

## 2016-06-25 MED ORDER — HYDROCODONE-ACETAMINOPHEN 5-325 MG PO TABS: 2.0000 | ORAL_TABLET | ORAL | 0 refills | Status: DC | PRN

## 2016-06-25 NOTE — ED Notes (Signed)
Ortho Tech made aware of open orders

## 2016-06-25 NOTE — Progress Notes (Signed)
Orthopedic Tech Progress Note Patient Details:  Cathy Green Feb 21, 1981 [id number]  Ortho Devices Type of Ortho Device: Crutches, Knee Immobilizer Ortho Device/Splint Location: lle Ortho Device/Splint Interventions: Application   Cathy Green 06/25/2016, 10:29 AM
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
## 2016-07-02 ENCOUNTER — Ambulatory Visit (INDEPENDENT_AMBULATORY_CARE_PROVIDER_SITE_OTHER): Payer: Self-pay | Admitting: Orthopedic Surgery

## 2016-07-02 DIAGNOSIS — S82402A Unspecified fracture of shaft of left fibula, initial encounter for closed fracture: Secondary | ICD-10-CM

## 2016-07-23 ENCOUNTER — Ambulatory Visit (INDEPENDENT_AMBULATORY_CARE_PROVIDER_SITE_OTHER): Payer: Self-pay | Admitting: Orthopedic Surgery

## 2016-07-23 ENCOUNTER — Encounter (INDEPENDENT_AMBULATORY_CARE_PROVIDER_SITE_OTHER): Payer: Self-pay | Admitting: Orthopedic Surgery

## 2016-07-23 ENCOUNTER — Ambulatory Visit (INDEPENDENT_AMBULATORY_CARE_PROVIDER_SITE_OTHER): Payer: Self-pay

## 2016-07-23 VITALS — Ht 61.0 in | Wt 158.0 lb

## 2016-07-23 DIAGNOSIS — S82832D Other fracture of upper and lower end of left fibula, subsequent encounter for closed fracture with routine healing: Secondary | ICD-10-CM

## 2016-07-23 DIAGNOSIS — M25562 Pain in left knee: Secondary | ICD-10-CM
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
## 2016-10-02 DIAGNOSIS — E11311 Type 2 diabetes mellitus with unspecified diabetic retinopathy with macular edema: Secondary | ICD-10-CM | POA: Insufficient documentation

## 2016-10-02 DIAGNOSIS — H2513 Age-related nuclear cataract, bilateral: Secondary | ICD-10-CM | POA: Insufficient documentation

## 2016-10-16 MED FILL — ?LISINOPRIL 10 MG TABLET: 10 | 30 days supply | Qty: 30 | Fill #2

## 2017-02-12 ENCOUNTER — Ambulatory Visit: Payer: Self-pay | Admitting: Internal Medicine

## 2017-02-12 ENCOUNTER — Other Ambulatory Visit: Payer: Self-pay | Admitting: Pharmacist

## 2017-02-12 DIAGNOSIS — E119 Type 2 diabetes mellitus without complications: Secondary | ICD-10-CM

## 2017-02-12 DIAGNOSIS — Z794 Long term (current) use of insulin: Principal | ICD-10-CM

## 2017-02-12 MED ORDER — INSULIN NPH (HUMAN) (ISOPHANE) 100 UNIT/ML ~~LOC~~ SUSP: 37.0000 [IU] | Freq: Two times a day (BID) | SUBCUTANEOUS | 0 refills | Status: DC

## 2017-02-12 MED FILL — NovoLIN N 100 UNIT/ML SUSP: 100 | 13 days supply | Qty: 10 | Fill #0

## 2017-02-14 ENCOUNTER — Ambulatory Visit: Payer: Self-pay | Attending: Internal Medicine | Admitting: Physician Assistant

## 2017-02-14 VITALS — BP 155/98 | HR 84 | Temp 98.3°F | Resp 16 | Wt 159.2 lb

## 2017-02-14 DIAGNOSIS — E1029 Type 1 diabetes mellitus with other diabetic kidney complication: Secondary | ICD-10-CM | POA: Insufficient documentation

## 2017-02-14 DIAGNOSIS — H409 Unspecified glaucoma: Secondary | ICD-10-CM | POA: Insufficient documentation

## 2017-02-14 DIAGNOSIS — E119 Type 2 diabetes mellitus without complications: Secondary | ICD-10-CM

## 2017-02-14 DIAGNOSIS — Z79899 Other long term (current) drug therapy: Secondary | ICD-10-CM | POA: Insufficient documentation

## 2017-02-14 DIAGNOSIS — R202 Paresthesia of skin: Secondary | ICD-10-CM | POA: Insufficient documentation

## 2017-02-14 DIAGNOSIS — I10 Essential (primary) hypertension: Secondary | ICD-10-CM | POA: Insufficient documentation

## 2017-02-14 DIAGNOSIS — N289 Disorder of kidney and ureter, unspecified: Secondary | ICD-10-CM | POA: Insufficient documentation

## 2017-02-14 DIAGNOSIS — E785 Hyperlipidemia, unspecified: Secondary | ICD-10-CM | POA: Insufficient documentation

## 2017-02-14 DIAGNOSIS — IMO0002 Reserved for concepts with insufficient information to code with codable children: Secondary | ICD-10-CM

## 2017-02-14 DIAGNOSIS — Z794 Long term (current) use of insulin: Secondary | ICD-10-CM | POA: Insufficient documentation

## 2017-02-14 DIAGNOSIS — R42 Dizziness and giddiness: Secondary | ICD-10-CM | POA: Insufficient documentation

## 2017-02-14 DIAGNOSIS — E1065 Type 1 diabetes mellitus with hyperglycemia: Secondary | ICD-10-CM

## 2017-02-14 LAB — GLUCOSE, POCT (MANUAL RESULT ENTRY): POC Glucose: 95 mg/dl (ref 70–99)

## 2017-02-14 LAB — POCT GLYCOSYLATED HEMOGLOBIN (HGB A1C): Hemoglobin A1C: 7.3

## 2017-02-14 MED ORDER — LISINOPRIL 10 MG PO TABS: 10.0000 mg | ORAL_TABLET | Freq: Every day | ORAL | 3 refills | Status: DC

## 2017-02-14 MED ORDER — GABAPENTIN 300 MG PO CAPS: 300.0000 mg | ORAL_CAPSULE | Freq: Every day | ORAL | 3 refills | Status: DC

## 2017-02-14 MED ORDER — FLUTICASONE PROPIONATE 50 MCG/ACT NA SUSP: 2.0000 | Freq: Every day | NASAL | 6 refills | Status: DC

## 2017-02-14 MED ORDER — INSULIN ASPART 100 UNIT/ML ~~LOC~~ SOLN: SUBCUTANEOUS | 3 refills | Status: DC

## 2017-02-14 MED ORDER — INSULIN NPH (HUMAN) (ISOPHANE) 100 UNIT/ML ~~LOC~~ SUSP: 39.0000 [IU] | Freq: Two times a day (BID) | SUBCUTANEOUS | 0 refills | Status: DC

## 2017-02-14 MED FILL — FLUTICASONE PROP 50 MCG SPR: 50 | 30 days supply | Qty: 16 | Fill #0

## 2017-02-14 MED FILL — $novoLOG 100 UNITS/ML VIAL: 100 | 28 days supply | Qty: 10 | Fill #0

## 2017-02-14 MED FILL — LISINOPRIL 10 MG TABLET: 10 | 30 days supply | Qty: 30 | Fill #0

## 2017-02-14 MED FILL — GABAPENTIN 300 MG CAPSULE: 300 | 30 days supply | Qty: 30 | Fill #0

## 2017-02-14 NOTE — Progress Notes (Signed)
Cathy Green, is a 36 y.o. female  GQB:[id number]  EKC:[id number]  DOB - Feb 14, 1981  Subjective:  Chief Complaint and HPI: Cathy Green is a 36 y.o. female here today for 2 week h/o intermittent dizziness that is worse in the mornings or if she stands up too abruptly.  She denies CP/SOB/vision changes.  No HA.  She has been having some sinus congestion and allergies and isn't taking any medications for this.    Also she is having paresthesias on and off in B feet.  No weakness or missed steps.  The paresthesias in her feet have been going on for several months.  + hand cramps intermittently for about 6 weeks.     Blood sugars have been bt 120-200-usu closer to 140.  No hyper/hypoglycemia  ROS:   Constitutional:  No f/c, No night sweats, No unexplained weight loss. EENT:  No vision changes, No blurry vision, No hearing changes. No mouth, throat, or ear problems. +sinus congestion Respiratory: No cough, No SOB Cardiac: No CP, no palpitations GI:  No abd pain, No N/V/D. GU: No Urinary s/sx Musculoskeletal: B foot pain/paresthesias Neuro: No headache, + dizziness, no motor weakness.  Skin: No rash Endocrine:  No polydipsia. No polyuria.  Psych: Denies SI/HI  No problems updated.  ALLERGIES: No Known Allergies  PAST MEDICAL HISTORY: Past Medical History:  Diagnosis Date  . Abnormal Pap smear   . Allergy    seasonal allergies  . Anemia   . Chronic kidney disease   . Diabetes mellitus    diagnosed age 71  . Diabetes mellitus without complication ([place])   . Glaucoma   . Hyperlipidemia   . Hypertension   . Migraines   . Noncompliance   . Numbness in left leg     MEDICATIONS AT HOME: Prior to Admission medications   Medication Sig Start Date End Date Taking? Authorizing Provider  Blood Glucose Monitoring Suppl (TRUE METRIX METER) w/Device KIT 37 Units/day by Does not apply route 4 (four) times daily. 10/18/15   Brayton Caves, PA-C  fluticasone (FLONASE) 50 MCG/ACT  nasal spray Place 2 sprays into both nostrils daily. 02/14/17   Argentina Donovan, PA-C  gabapentin (NEURONTIN) 300 MG capsule Take 1 capsule (300 mg total) by mouth at bedtime. For  Foot pain/numbness 02/14/17   Freeman Caldron M, PA-C  glucose blood (TRUE METRIX BLOOD GLUCOSE TEST) test strip Use as instructed 10/18/15   Zettie Pho S, PA-C  glucose blood test strip 1 each by Other route daily. 03/05/16   [provider]  insulin aspart (NOVOLOG) 100 UNIT/ML injection INJECT 0 TO 10 UNITS UNDER THE SKIN 3 TIMES A DAY WITH MEALS 02/14/17   Zohar Maroney M, PA-C  insulin NPH Human (HUMULIN N,NOVOLIN N) 100 UNIT/ML injection Inject 0.39 mLs (39 Units total) into the skin 2 (two) times daily before a meal. 02/14/17   Lucilia Yanni, Dionne Bucy, PA-C  lisinopril (PRINIVIL,ZESTRIL) 10 MG tablet Take 1 tablet (10 mg total) by mouth daily. 02/14/17   Argentina Donovan, PA-C  methocarbamol (ROBAXIN) 750 MG tablet Take 1 tablet (750 mg total) by mouth 4 (four) times daily. X 7 days then prn muscle spasm Patient not taking: Reported on 07/23/2016 03/23/16   Argentina Donovan, PA-C  ondansetron (ZOFRAN) 4 MG tablet Take 1 tablet (4 mg total) by mouth every 8 (eight) hours as needed for nausea or vomiting. Patient not taking: Reported on 07/23/2016 12/27/15   Lance Bosch, NP  promethazine (PHENERGAN) 25  MG/ML injection Inject 1 mL (25 mg total) into the vein once. Patient not taking: Reported on 07/23/2016 12/27/15   Lance Bosch, NP  TRUEPLUS LANCETS 28G MISC 37 Units by Does not apply route 4 (four) times daily. 10/18/15   Brayton Caves, PA-C     Objective:  EXAM:   Vitals:   02/14/17 1549  BP: (!) 155/98  Pulse: 84  Resp: 16  Temp: 98.3 F (36.8 C)  TempSrc: Oral  SpO2: 100%  Weight: 159 lb 3.2 oz (72.2 kg)    General appearance : A&OX3. NAD. Non-toxic-appearing HEENT: Atraumatic and Normocephalic.  PERRLA. EOM intact. No nystagmus  TM full B without infection. B turbinates enlarged and  boggy  Mouth-MMM, post pharynx WNL w/o erythema, No PND.   Neck: supple, no JVD. No cervical lymphadenopathy. No thyromegaly Chest/Lungs:  Breathing-non-labored, Good air entry bilaterally, breath sounds normal without rales, rhonchi, or wheezing  CVS: S1 S2 regular, no murmurs, gallops, rubs  Extremities: Bilateral Lower Ext shows no edema, both legs are warm to touch with = pulse throughout.  B feet with great capillary RF and circulation.  N-V intact.  All digits with good pallor and normal propioception.   Neurology:  CN II-XII grossly intact, Non focal.  Finger to nose, heel to shin, neg rhomberg.   Psych:  TP linear. J/I WNL. Normal speech. Appropriate eye contact and affect.  Skin:  No Rash  Data Review Lab Results  Component Value Date   HGBA1C 7.3 02/14/2017   HGBA1C 5.9 03/23/2016   HGBA1C 13.30 10/18/2015     Assessment & Plan   1. 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  Dispense: 10 mL; Refill: 0  2. Essential hypertension Uncontrolled.  She will check BP daily OOO and record and bring to next visit.   - Comprehensive metabolic panel continue- lisinopril (PRINIVIL,ZESTRIL) 10 MG tablet; Take 1 tablet (10 mg total) by mouth daily.  Dispense: 90 tablet; Refill: 3  3. Paresthesia-normal foot exam - Comprehensive metabolic panel - TSH - Vitamin D, 25-hydroxy - gabapentin (NEURONTIN) 300 MG capsule; Take 1 capsule (300 mg total) by mouth at bedtime. For  Foot pain/numbness  Dispense: 30 capsule; Refill: 3  4.  Dizziness No neurological/central signs.  She does have significant congestion that is likely causing this.   - fluticasone (FLONASE) 50 MCG/ACT nasal spray; Place 2 sprays into both nostrils daily.  Dispense: 16 g; Refill: 6     Patient have been counseled extensively about nutrition and exercise  Return in about 6 weeks (around 03/28/2017) for  assign PCP; f/up htn and DM.  The patient was given clear instructions to go to ER or return to medical center if symptoms don't improve, worsen or new problems develop. The patient verbalized understanding. The patient was told to call to get lab results if they haven't heard anything in the next week.     Freeman Caldron, PA-C [facility] and [place], [place]   02/14/2017, 4:48 PMPatient ID: Cathy Green, female  DOB: 05-Sep-1981, 36 y.o.   MRN: [id number]

## 2017-02-14 NOTE — Patient Instructions (Signed)
Check blood pressure outside of the office and record and bring to next office visit

## 2017-02-15 LAB — TSH: TSH: 2.32 u[IU]/mL (ref 0.450–4.500)

## 2017-02-15 LAB — COMPREHENSIVE METABOLIC PANEL
A/G RATIO: 1.2 (ref 1.2–2.2)
ALT: 12 IU/L (ref 0–32)
AST: 14 IU/L (ref 0–40)
Albumin: 4 g/dL (ref 3.5–5.5)
Alkaline Phosphatase: 71 IU/L (ref 39–117)
BUN/Creatinine Ratio: 12 (ref 9–23)
BUN: 27 mg/dL — ABNORMAL HIGH (ref 6–20)
Bilirubin Total: <0.2 mg/dL (ref 0.0–1.2)
CALCIUM: 9.1 mg/dL (ref 8.7–10.2)
CO2: 16 mmol/L — ABNORMAL LOW (ref 18–29)
Chloride: 108 mmol/L — ABNORMAL HIGH (ref 96–106)
Creatinine, Ser: 2.25 mg/dL — ABNORMAL HIGH (ref 0.57–1.00)
GFR, EST AFRICAN AMERICAN: 32 mL/min/{1.73_m2} — AB (ref >59)
GFR, EST NON AFRICAN AMERICAN: 27 mL/min/{1.73_m2} — AB (ref >59)
Globulin, Total: 3.3 g/dL (ref 1.5–4.5)
Glucose: 93 mg/dL (ref 65–99)
Potassium: 5.8 mmol/L — ABNORMAL HIGH (ref 3.5–5.2)
Sodium: 141 mmol/L (ref 134–144)
TOTAL PROTEIN: 7.3 g/dL (ref 6.0–8.5)

## 2017-02-15 LAB — VITAMIN D 25 HYDROXY (VIT D DEFICIENCY, FRACTURES): Vit D, 25-Hydroxy: 17 ng/mL — ABNORMAL LOW (ref 30.0–100.0)

## 2017-02-20 ENCOUNTER — Telehealth: Payer: Self-pay

## 2017-02-20 ENCOUNTER — Other Ambulatory Visit: Payer: Self-pay | Admitting: Physician Assistant

## 2017-02-20 DIAGNOSIS — E559 Vitamin D deficiency, unspecified: Secondary | ICD-10-CM

## 2017-02-20 MED ORDER — VITAMIN D (ERGOCALCIFEROL) 1.25 MG (50000 UNIT) PO CAPS: 50000.0000 [IU] | ORAL_CAPSULE | ORAL | 0 refills | Status: DC

## 2017-02-20 NOTE — Telephone Encounter (Signed)
Contacted pt to go over lab results pt is aware of results and doesn't have any questions or concerns 

## 2017-02-21 MED FILL — VIT D2 1.25 MG (50,000 UNIT: 1.25 MG | 84 days supply | Qty: 12 | Fill #0

## 2017-03-29 ENCOUNTER — Ambulatory Visit: Payer: Self-pay | Attending: Internal Medicine | Admitting: Internal Medicine

## 2017-03-29 ENCOUNTER — Encounter: Payer: Self-pay | Admitting: Internal Medicine

## 2017-03-29 VITALS — BP 160/84 | HR 82 | Temp 97.9°F | Wt 164.0 lb

## 2017-03-29 DIAGNOSIS — Z794 Long term (current) use of insulin: Secondary | ICD-10-CM | POA: Insufficient documentation

## 2017-03-29 DIAGNOSIS — E10311 Type 1 diabetes mellitus with unspecified diabetic retinopathy with macular edema: Secondary | ICD-10-CM

## 2017-03-29 DIAGNOSIS — I129 Hypertensive chronic kidney disease with stage 1 through stage 4 chronic kidney disease, or unspecified chronic kidney disease: Secondary | ICD-10-CM | POA: Insufficient documentation

## 2017-03-29 DIAGNOSIS — N183 Chronic kidney disease, stage 3 (moderate): Secondary | ICD-10-CM | POA: Insufficient documentation

## 2017-03-29 DIAGNOSIS — E559 Vitamin D deficiency, unspecified: Secondary | ICD-10-CM

## 2017-03-29 DIAGNOSIS — E1022 Type 1 diabetes mellitus with diabetic chronic kidney disease: Secondary | ICD-10-CM

## 2017-03-29 DIAGNOSIS — Z79899 Other long term (current) drug therapy: Secondary | ICD-10-CM | POA: Insufficient documentation

## 2017-03-29 DIAGNOSIS — E875 Hyperkalemia: Secondary | ICD-10-CM

## 2017-03-29 DIAGNOSIS — E1042 Type 1 diabetes mellitus with diabetic polyneuropathy: Secondary | ICD-10-CM

## 2017-03-29 DIAGNOSIS — I10 Essential (primary) hypertension: Secondary | ICD-10-CM

## 2017-03-29 LAB — GLUCOSE, POCT (MANUAL RESULT ENTRY): POC Glucose: 112 mg/dl — AB (ref 70–99)

## 2017-03-29 NOTE — Progress Notes (Signed)
Patient ID: Cathy Green, female    DOB: [birth year]  MRN: [id number]  CC: Hypertension and Diabetes   Subjective: Cathy Green is a 36 y.o. female who presents for chronic ds management and to become established with me as PCP. Her sister is with her. 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 History of CKD stage III, HTN and vitamin D deficiency. Saw our PA last month.   1.  HTN/CKD stage 3 -high K on recent labs. Restarted on Lisinopril on that visit.  Prior to that she had been out of Lisinopril for 8 or more mths -checks BP every day and has log with her for my review. Most recent readings are- 141/86, 103/69, 104/64, 127/80 -limits salt in foods -no LE edema -She has never seen a nephrologist. Last urine microalbumin done a year ago and protein level of 1200  2. Vit D was low.  Taking high-dose Vit D supplement as prescribed  2.  DM -restarted on Novolog and NPH on last visit.  Prior to that she was out for 3 mths -checking BS 1-2 x a day Highest reading was 186. A.m range 90-130; after dinner 150 has been highest -Numbness and tingling in feet better with gabapentin  -had eye exam yesterday Dr. Katy Fitch.  Has retinopathy in both eyes. Also sees Dr. Brigitte Pulse for injections RT eye for edema retina, last seen 03/19/2017. -could do better with eating habits - loves chocolate, candies, chips -walks daily to and from bus stop.   Patient Active Problem List   Diagnosis Date Noted  . DM (diabetes mellitus) type I controlled with renal manifestation ([place]) 04/13/2016  . AKI (acute kidney injury) ([place]) 11/03/2015  . Proteinuria 11/01/2015  . ARF (acute renal failure) ([place]) 10/20/2015  . Dehydration 10/19/2015  . Nausea vomiting and diarrhea 10/19/2015  . Nausea and vomiting 10/19/2015  . Anemia 09/07/2011  . DM (diabetes mellitus), type 1, uncontrolled, with renal complications ([place]) 94/70/9628  . Hyperlipidemia 09/07/2011   . Hypertension 09/07/2011     Current Outpatient Prescriptions on File Prior to Visit  Medication Sig Dispense Refill  . Blood Glucose Monitoring Suppl (TRUE METRIX METER) w/Device KIT 37 Units/day by Does not apply route 4 (four) times daily. 100 kit 3  . fluticasone (FLONASE) 50 MCG/ACT nasal spray Place 2 sprays into both nostrils daily. 16 g 6  . gabapentin (NEURONTIN) 300 MG capsule Take 1 capsule (300 mg total) by mouth at bedtime. For  Foot pain/numbness 30 capsule 3  . glucose blood (TRUE METRIX BLOOD GLUCOSE TEST) test strip Use as instructed 100 each 12  . glucose blood test strip 1 each by Other route daily.  12  . insulin aspart (NOVOLOG) 100 UNIT/ML injection INJECT 0 TO 10 UNITS UNDER THE SKIN 3 TIMES A DAY WITH MEALS 30 mL 3  . insulin NPH Human (HUMULIN N,NOVOLIN N) 100 UNIT/ML injection Inject 0.39 mLs (39 Units total) into the skin 2 (two) times daily before a meal. 10 mL 0  . lisinopril (PRINIVIL,ZESTRIL) 10 MG tablet Take 1 tablet (10 mg total) by mouth daily. 90 tablet 3  . methocarbamol (ROBAXIN) 750 MG tablet Take 1 tablet (750 mg total) by mouth 4 (four) times daily. X 7 days then prn muscle spasm (Patient not taking: Reported on 07/23/2016) 90 tablet 0  . ondansetron (ZOFRAN) 4 MG tablet Take 1 tablet (4 mg total) by mouth every 8 (eight) hours as needed  for nausea or vomiting. (Patient not taking: Reported on 07/23/2016) 12 tablet 1  . promethazine (PHENERGAN) 25 MG/ML injection Inject 1 mL (25 mg total) into the vein once. (Patient not taking: Reported on 07/23/2016) 1 mL 0  . TRUEPLUS LANCETS 28G MISC 37 Units by Does not apply route 4 (four) times daily. 100 each 3  . Vitamin D, Ergocalciferol, (DRISDOL) 50000 units CAPS capsule Take 1 capsule (50,000 Units total) by mouth every 7 (seven) days. 16 capsule 0   No current facility-administered medications on file prior to visit.     No Known Allergies  Social History   Social History  . Marital status: Single      Spouse name: N/A  . Number of children: N/A  . Years of education: N/A   Occupational History  . Not on file.   Social History Main Topics  . Smoking status: Never Smoker  . Smokeless tobacco: Never Used  . Alcohol use Yes     Comment: occasional   . Drug use: No  . Sexual activity: No   Other Topics Concern  . Not on file   Social History Narrative   ** Merged History Encounter **        Family History  Problem Relation Age of Onset  . Cancer Maternal Grandfather        prostate  . Hypertension Mother   . Stroke Mother   . Cancer Other   . COPD Other   . Hyperlipidemia Other   . Hypertension Father     Past Surgical History:  Procedure Laterality Date  . WISDOM TOOTH EXTRACTION      ROS: Review of Systems As stated above. PHYSICAL EXAM: BP (!) 160/84   Pulse 82   Temp 97.9 F (36.6 C) (Oral)   Wt 164 lb (74.4 kg)   SpO2 98%   BMI 30.99 kg/m   Repeat 150/80 Physical Exam General appearance - alert, well appearing, and in no distress Mental status - alert, oriented to person, place, and time, normal mood, behavior, speech, dress, motor activity, and thought processes Eyes - pupils equal and reactive, extraocular eye movements intact Neck - supple, no significant adenopathy Chest - clear to auscultation, no wheezes, rales or rhonchi, symmetric air entry Heart - normal rate, regular rhythm, normal S1, S2, no murmurs, rubs, clicks or gallops Extremities - peripheral pulses normal, no pedal edema, no clubbing or cyanosis   Results for orders placed or performed in visit on 03/29/17  POCT glucose (manual entry)  Result Value Ref Range   POC Glucose 112 (A) 70 - 99 mg/dl   Lab Results  Component Value Date   HGBA1C 7.3 02/14/2017     Chemistry      Component Value Date/Time   NA 141 02/14/2017 1623   K 5.8 (H) 02/14/2017 1623   CL 108 (H) 02/14/2017 1623   CO2 16 (L) 02/14/2017 1623   BUN 27 (H) 02/14/2017 1623   CREATININE 2.25 (H)  02/14/2017 1623   CREATININE 2.57 (H) 04/17/2016 1527      Component Value Date/Time   CALCIUM 9.1 02/14/2017 1623   ALKPHOS 71 02/14/2017 1623   AST 14 02/14/2017 1623   ALT 12 02/14/2017 1623   BILITOT <0.2 02/14/2017 1623       ASSESSMENT AND PLAN: 1. Controlled type 1 diabetes mellitus with chronic kidney disease, unspecified CKD stage (HCC) -Continue current dose of insulin. -Dietary counseling given. Advised to cut out eating sweet snacks. Eat more  fruits and vegetables. -We will recheck potassium level given that she is on lisinopril. -We will refer to nephrology. - POCT glucose (manual entry) - Ambulatory referral to Nephrology - Basic metabolic panel - Lipid panel  2. 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  3. Diabetic polyneuropathy associated with type 1 diabetes mellitus ([place]) -Doing better on gabapentin.  4. Vitamin D deficiency Complete the course of high-dose vitamin D supplement after which she should start taking 1000 IU daily  5. Hyperkalemia See plan above  6. Essential hypertension -Elevated today but patient states that she was a bit nervous. Her home blood pressure readings have been good. We will have her come back as a nurse only visit in about 3 weeks for recheck.  Patient was given the opportunity to ask questions.  Patient verbalized understanding of the plan and was able to repeat key elements of the plan.   No orders of the defined types were placed in this encounter.    Requested Prescriptions    No prescriptions requested or ordered in this encounter    No Follow-up on file.  Karle Plumber, MD, FACP

## 2017-03-29 NOTE — Patient Instructions (Signed)
Please give informed the patient to sign up for [facility] card and [facility] discount. Please give appointment with Miss Luciana Axe. Please give appointment with Carilyn Goodpasture for repeat blood pressure check in 3 weeks.  Follow a Healthy Eating Plan - You can do it! Limit sugary drinks.  Avoid sodas, sweet tea, sport or energy drinks, or fruit drinks.  Drink water, lo-fat milk, or diet drinks. Limit snack foods.   Cut back on candy, cake, cookies, chips, ice cream.  These are a special treat, only in small amounts. Eat plenty of vegetables.  Especially dark green, red, and orange vegetables. Aim for at least 3 servings a day. More is better! Include fruit in your daily diet.  Whole fruit is much healthier than fruit juice! Limit "white" bread, "white" pasta, "white" rice.   Choose "100% whole grain" products, brown or wild rice. Avoid fatty meats. Try "Meatless Monday" and choose eggs or beans one day a week.  When eating meat, choose lean meats like chicken, [place], and fish.  Grill, broil, or bake meats instead of frying, and eat poultry without the skin. Eat less salt.  Avoid frozen pizzas, frozen dinners and salty foods.  Use seasonings other than salt in cooking.  This can help blood pressure and keep you from swelling Beer, wine and liquor have calories.  If you can safely drink alcohol, limit to 1 drink per day for women, 2 drinks for men

## 2017-03-30 ENCOUNTER — Telehealth: Payer: Self-pay | Admitting: Internal Medicine

## 2017-03-30 DIAGNOSIS — E875 Hyperkalemia: Secondary | ICD-10-CM

## 2017-03-30 LAB — BASIC METABOLIC PANEL
BUN / CREAT RATIO: 13 (ref 9–23)
BUN: 34 mg/dL — AB (ref 6–20)
CO2: 18 mmol/L — ABNORMAL LOW (ref 20–29)
CREATININE: 2.55 mg/dL — AB (ref 0.57–1.00)
Calcium: 8.9 mg/dL (ref 8.7–10.2)
Chloride: 110 mmol/L — ABNORMAL HIGH (ref 96–106)
GFR, EST AFRICAN AMERICAN: 27 mL/min/{1.73_m2} — AB (ref >59)
GFR, EST NON AFRICAN AMERICAN: 24 mL/min/{1.73_m2} — AB (ref >59)
Glucose: 98 mg/dL (ref 65–99)
Potassium: 6.2 mmol/L — ABNORMAL HIGH (ref 3.5–5.2)
SODIUM: 142 mmol/L (ref 134–144)

## 2017-03-30 LAB — LIPID PANEL
CHOL/HDL RATIO: 4.1 ratio (ref 0.0–4.4)
CHOLESTEROL TOTAL: 253 mg/dL — AB (ref 100–199)
HDL: 62 mg/dL (ref >39)
LDL CALC: 154 mg/dL — AB (ref 0–99)
Triglycerides: 187 mg/dL — ABNORMAL HIGH (ref 0–149)
VLDL Cholesterol Cal: 37 mg/dL (ref 5–40)

## 2017-03-30 MED ORDER — AMLODIPINE BESYLATE 5 MG PO TABS: 5.0000 mg | ORAL_TABLET | Freq: Every day | ORAL | 3 refills | Status: DC

## 2017-03-30 MED ORDER — ATORVASTATIN CALCIUM 10 MG PO TABS: 10.0000 mg | ORAL_TABLET | Freq: Every day | ORAL | 3 refills | Status: DC

## 2017-03-30 MED ORDER — GLUCOSE BLOOD VI STRP: ORAL_STRIP | 12 refills | Status: DC

## 2017-03-30 MED ORDER — SODIUM POLYSTYRENE SULFONATE 15 GM/60ML PO SUSP: 15.0000 g | Freq: Once | ORAL | 0 refills | Status: AC

## 2017-03-30 NOTE — Telephone Encounter (Signed)
PC placed to pt this evening. Pt informed that her K+ level was elevated at 6.2, even higher than last mth.  Level last mth was 5.8 before she was restarted on Lisinopril. Pt states she feels well at this time. Recommendations:  1. Stop Lisinopril. Will start medication call Amlodipine instead 2.  Limit eating K+ rich foods like oranges, OJ and banana. Pt told to Google K rich foods to get list of other foods that are high in potassium. 3. I need her to take a med called Kayexalate as a one time dose to help drive down K+ level. Pt states she has some of this medication left over from last yr when she had to take it for same reason. Pt has even for 60 ml.  Does not know of any 24 hr pharmacy in her area. Pt told to take 60 ml tonight and I will send new rxn for additional 60 ml which she can pick up in a.m and take. 4. Return to lab on Monday July 2nd to have level rechecked.  5. Pt informed that LDL cholesterol not at goal. Pt below age of 48 but has other CV risk factors and DM co-morbidities.  I recommend starting Lipitor.  Will send rxn to [facility]  6. Pt requested RF on stripes for glucometer.

## 2017-04-01 MED FILL — TRUE METRIX TEST STRIP: 30 days supply | Qty: 100 | Fill #0

## 2017-04-01 MED FILL — AMLODIPINE BESYLATE 5 MG TA: 5 | 30 days supply | Qty: 30 | Fill #0

## 2017-04-01 MED FILL — ?ATORVASTATIN 10 MG TABLET: 10 | 30 days supply | Qty: 30 | Fill #0

## 2017-04-02 ENCOUNTER — Ambulatory Visit: Payer: Self-pay | Attending: Internal Medicine

## 2017-04-02 DIAGNOSIS — E1022 Type 1 diabetes mellitus with diabetic chronic kidney disease: Secondary | ICD-10-CM | POA: Insufficient documentation

## 2017-04-02 DIAGNOSIS — E875 Hyperkalemia: Secondary | ICD-10-CM | POA: Insufficient documentation

## 2017-04-02 NOTE — Progress Notes (Signed)
Patient here for lab visit only 

## 2017-04-03 LAB — POTASSIUM: Potassium: 4.6 mmol/L (ref 3.5–5.2)

## 2017-04-03 LAB — MICROALBUMIN / CREATININE URINE RATIO
CREATININE, UR: 150.9 mg/dL
MICROALBUM., U, RANDOM: 1585.1 ug/mL
Microalb/Creat Ratio: 1050.4 mg/g creat — ABNORMAL HIGH (ref 0.0–30.0)

## 2017-04-05 ENCOUNTER — Telehealth: Payer: Self-pay

## 2017-04-05 NOTE — Telephone Encounter (Signed)
Contacted pt to go over lab results pt wasn't lm with a women asking to let pt know to give me a call to go over lab results  If pt calls back please give results: her potassium level has normalized. She still has a lot of protein in the urine but it has declined some since last year. I have submitted the referral for the nephrologist.

## 2017-04-19 ENCOUNTER — Ambulatory Visit: Payer: Self-pay | Attending: Internal Medicine | Admitting: *Deleted

## 2017-04-19 VITALS — BP 123/86 | HR 96 | Temp 98.0°F | Resp 16

## 2017-04-19 DIAGNOSIS — I10 Essential (primary) hypertension: Secondary | ICD-10-CM | POA: Insufficient documentation

## 2017-04-19 DIAGNOSIS — Z0130 Encounter for examination of blood pressure without abnormal findings: Secondary | ICD-10-CM

## 2017-04-19 MED ORDER — AMLODIPINE BESYLATE 2.5 MG PO TABS: 2.5000 mg | ORAL_TABLET | Freq: Every day | ORAL | 3 refills | Status: DC

## 2017-04-19 MED ORDER — AMLODIPINE BESYLATE 5 MG PO TABS: 2.5000 mg | ORAL_TABLET | Freq: Every day | ORAL | 0 refills | Status: DC

## 2017-04-19 NOTE — Progress Notes (Signed)
Pt arrived to [facility]. Pt alert and oriented and arrives in good spirits. Last OV 03/29/2017  with Dr. Wynetta Emery. Pt denies chest pain, SOB, HA, or blurred vision. Dizziness for 2 weeks, after starting medications. Notices this anytime, not only  upon standing. Drinks 4-5 glasses of water daily.   Verified medication. Pt states medication was taken this morning.  Manual blood pressure reading: 128/84 and 123/86   Plan-  Change Amlodipine 2.5 mg daily F/u with PCP in 1 week

## 2017-04-26 ENCOUNTER — Encounter: Payer: Self-pay | Admitting: Internal Medicine

## 2017-04-26 ENCOUNTER — Ambulatory Visit: Payer: Self-pay | Attending: Internal Medicine | Admitting: Internal Medicine

## 2017-04-26 VITALS — BP 148/78 | HR 89 | Temp 98.9°F | Resp 16 | Wt 163.6 lb

## 2017-04-26 DIAGNOSIS — I10 Essential (primary) hypertension: Secondary | ICD-10-CM

## 2017-04-26 DIAGNOSIS — Z79899 Other long term (current) drug therapy: Secondary | ICD-10-CM | POA: Insufficient documentation

## 2017-04-26 DIAGNOSIS — I129 Hypertensive chronic kidney disease with stage 1 through stage 4 chronic kidney disease, or unspecified chronic kidney disease: Secondary | ICD-10-CM | POA: Insufficient documentation

## 2017-04-26 DIAGNOSIS — E559 Vitamin D deficiency, unspecified: Secondary | ICD-10-CM | POA: Insufficient documentation

## 2017-04-26 DIAGNOSIS — Z794 Long term (current) use of insulin: Secondary | ICD-10-CM | POA: Insufficient documentation

## 2017-04-26 DIAGNOSIS — E1021 Type 1 diabetes mellitus with diabetic nephropathy: Secondary | ICD-10-CM | POA: Insufficient documentation

## 2017-04-26 DIAGNOSIS — E1022 Type 1 diabetes mellitus with diabetic chronic kidney disease: Secondary | ICD-10-CM

## 2017-04-26 DIAGNOSIS — E1040 Type 1 diabetes mellitus with diabetic neuropathy, unspecified: Secondary | ICD-10-CM | POA: Insufficient documentation

## 2017-04-26 DIAGNOSIS — N184 Chronic kidney disease, stage 4 (severe): Secondary | ICD-10-CM

## 2017-04-26 DIAGNOSIS — E10319 Type 1 diabetes mellitus with unspecified diabetic retinopathy without macular edema: Secondary | ICD-10-CM

## 2017-04-26 DIAGNOSIS — E11319 Type 2 diabetes mellitus with unspecified diabetic retinopathy without macular edema: Secondary | ICD-10-CM | POA: Insufficient documentation

## 2017-04-26 DIAGNOSIS — E785 Hyperlipidemia, unspecified: Secondary | ICD-10-CM | POA: Insufficient documentation

## 2017-04-26 DIAGNOSIS — N183 Chronic kidney disease, stage 3 (moderate): Secondary | ICD-10-CM | POA: Insufficient documentation

## 2017-04-26 LAB — GLUCOSE, POCT (MANUAL RESULT ENTRY): POC Glucose: 135 mg/dl — AB (ref 70–99)

## 2017-04-26 NOTE — Patient Instructions (Signed)
Please turn in your application for the [facility] card as soon as possible. Once approved, please let me know or our referral specialist so that we can move forward with scheduling appointment with the nephrologist.   Continue to check blood pressure daily.  Call or follow up sooner if top number runs higher than 135-140 and bottom number higher than 85-90.

## 2017-04-26 NOTE — Progress Notes (Signed)
Patient ID: Cathy Green, female    DOB: 10/10/1980  MRN: [id number]  CC:    Subjective:  Cathy Green is a 36 y.o. female who presents for UC visit. Her concerns today include:  Hx Dm type 1 (dx at age 40) with nephropathy, retinopathy and neuropathy. History of CKD stage III, HTN and vitamin D deficiency.   Htn: After last visit patient was contacted and told to discontinue lisinopril due to elevated potassium. -Started on Norvasc instead. -Checking blood pressure daily with arm cuff.  Range 120s-135/80-90. -Saw RN last week for blood pressure check. Blood pressure was normal but she reported some nausea with Norvasc. Patient was advised to decrease the dose from 5 mg to 2.5 mg daily She has done better with this. Denies any nausea, dizziness, headaches. -No nephrology appointment as yet. She has completed her application for [facility] card but has not turned it in as yet. She was sent a letter informing her of her options and call us to be seen by nephrology without insurance which she cannot afford.   Patient Active Problem List   Diagnosis Date Noted  . DM (diabetes mellitus) type I controlled with renal manifestation ([place]) 04/13/2016  . AKI (acute kidney injury) ([place]) 11/03/2015  . Proteinuria 11/01/2015  . ARF (acute renal failure) ([place]) 10/20/2015  . Dehydration 10/19/2015  . Nausea vomiting and diarrhea 10/19/2015  . Nausea and vomiting 10/19/2015  . Anemia 09/07/2011  . DM (diabetes mellitus), type 1, uncontrolled, with renal complications ([place]) 79/89/2119  . Hyperlipidemia 09/07/2011  . Hypertension 09/07/2011     Current Outpatient Prescriptions on File Prior to Visit  Medication Sig Dispense Refill  . amLODipine (NORVASC) 2.5 MG tablet Take 1 tablet (2.5 mg total) by mouth daily. 90 tablet 3  . atorvastatin (LIPITOR) 10 MG tablet Take 1 tablet (10 mg total) by mouth daily. 90 tablet 3  . Blood Glucose Monitoring Suppl (TRUE METRIX METER) w/Device KIT 37 Units/day  by Does not apply route 4 (four) times daily. 100 kit 3  . fluticasone (FLONASE) 50 MCG/ACT nasal spray Place 2 sprays into both nostrils daily. 16 g 6  . gabapentin (NEURONTIN) 300 MG capsule Take 1 capsule (300 mg total) by mouth at bedtime. For  Foot pain/numbness 30 capsule 3  . glucose blood (TRUE METRIX BLOOD GLUCOSE TEST) test strip Use as instructed 100 each 12  . glucose blood (TRUE METRIX BLOOD GLUCOSE TEST) test strip Use as instructed 100 each 12  . insulin aspart (NOVOLOG) 100 UNIT/ML injection INJECT 0 TO 10 UNITS UNDER THE SKIN 3 TIMES A DAY WITH MEALS 30 mL 3  . insulin NPH Human (HUMULIN N,NOVOLIN N) 100 UNIT/ML injection Inject 0.39 mLs (39 Units total) into the skin 2 (two) times daily before a meal. 10 mL 0  . methocarbamol (ROBAXIN) 750 MG tablet Take 1 tablet (750 mg total) by mouth 4 (four) times daily. X 7 days then prn muscle spasm (Patient not taking: Reported on 04/19/2017) 90 tablet 0  . ondansetron (ZOFRAN) 4 MG tablet Take 1 tablet (4 mg total) by mouth every 8 (eight) hours as needed for nausea or vomiting. (Patient not taking: Reported on 07/23/2016) 12 tablet 1  . promethazine (PHENERGAN) 25 MG/ML injection Inject 1 mL (25 mg total) into the vein once. (Patient not taking: Reported on 07/23/2016) 1 mL 0  . TRUEPLUS LANCETS 28G MISC 37 Units by Does not apply route 4 (four) times daily. 100 each 3  . Vitamin D, Ergocalciferol, (  DRISDOL) 50000 units CAPS capsule Take 1 capsule (50,000 Units total) by mouth every 7 (seven) days. 16 capsule 0   No current facility-administered medications on file prior to visit.     No Known Allergies   ROS: Review of Systems Negative except as stated above  PHYSICAL EXAM: BP (!) 162/106   Pulse 89   Temp 98.9 F (37.2 C) (Oral)   Resp 16   Wt 163 lb 9.6 oz (74.2 kg)   SpO2 99%   BMI 30.91 kg/m   Repeat BP 148/78 Physical Exam  General appearance - alert, well appearing, and in no distress Mental status - alert,  oriented to person, place, and time, normal mood, behavior, speech, dress, motor activity, and thought processes  Results for orders placed or performed in visit on 04/26/17  POCT glucose (manual entry)  Result Value Ref Range   POC Glucose 135 (A) 70 - 99 mg/dl    ASSESSMENT AND PLAN: 1. Essential hypertension -Better on repeat blood pressure check today. Home blood pressure readings have been normal. Patient advised to continue checking blood pressures daily with goal of 135/85 or lower. Advised her to return in her application and supporting documents for the [facility] card as soon as possible so that appointment can be made for her to see nephrology. Patient reminded that her kidney stages at 4  2. Controlled type 1 diabetes mellitus with chronic kidney disease, unspecified CKD stage (HCC) - POCT glucose (manual entry)  Patient was given the opportunity to ask questions.  Patient verbalized understanding of the plan and was able to repeat key elements of the plan.   Orders Placed This Encounter  Procedures  . POCT glucose (manual entry)     Requested Prescriptions    No prescriptions requested or ordered in this encounter    F/u in 2 mth Karle Plumber, MD, Rosalita Chessman

## 2017-05-23 MED FILL — VIT D2 1.25 MG (50,000 UNIT: 1.25 MG | 27 days supply | Qty: 4 | Fill #1

## 2017-05-23 MED FILL — ?ATORVASTATIN 10 MG TABLET: 10 | 30 days supply | Qty: 30 | Fill #1

## 2017-05-23 MED FILL — AMLODIPINE BESYLATE 5 MG TA: 5 | 30 days supply | Qty: 30 | Fill #1

## 2017-05-24 ENCOUNTER — Ambulatory Visit: Payer: Self-pay | Attending: Internal Medicine

## 2017-06-17 ENCOUNTER — Ambulatory Visit: Payer: Self-pay | Attending: Internal Medicine | Admitting: Internal Medicine

## 2017-06-17 ENCOUNTER — Encounter: Payer: Self-pay | Admitting: Internal Medicine

## 2017-06-17 VITALS — BP 123/82 | HR 85 | Temp 99.2°F | Resp 16 | Wt 158.4 lb

## 2017-06-17 DIAGNOSIS — Z823 Family history of stroke: Secondary | ICD-10-CM | POA: Insufficient documentation

## 2017-06-17 DIAGNOSIS — I129 Hypertensive chronic kidney disease with stage 1 through stage 4 chronic kidney disease, or unspecified chronic kidney disease: Secondary | ICD-10-CM | POA: Insufficient documentation

## 2017-06-17 DIAGNOSIS — E1022 Type 1 diabetes mellitus with diabetic chronic kidney disease: Secondary | ICD-10-CM | POA: Insufficient documentation

## 2017-06-17 DIAGNOSIS — E10311 Type 1 diabetes mellitus with unspecified diabetic retinopathy with macular edema: Secondary | ICD-10-CM | POA: Insufficient documentation

## 2017-06-17 DIAGNOSIS — N184 Chronic kidney disease, stage 4 (severe): Secondary | ICD-10-CM

## 2017-06-17 DIAGNOSIS — E559 Vitamin D deficiency, unspecified: Secondary | ICD-10-CM | POA: Insufficient documentation

## 2017-06-17 DIAGNOSIS — D649 Anemia, unspecified: Secondary | ICD-10-CM | POA: Insufficient documentation

## 2017-06-17 DIAGNOSIS — E1029 Type 1 diabetes mellitus with other diabetic kidney complication: Secondary | ICD-10-CM

## 2017-06-17 DIAGNOSIS — Z825 Family history of asthma and other chronic lower respiratory diseases: Secondary | ICD-10-CM | POA: Insufficient documentation

## 2017-06-17 DIAGNOSIS — E785 Hyperlipidemia, unspecified: Secondary | ICD-10-CM | POA: Insufficient documentation

## 2017-06-17 DIAGNOSIS — M7989 Other specified soft tissue disorders: Secondary | ICD-10-CM | POA: Insufficient documentation

## 2017-06-17 DIAGNOSIS — Z79899 Other long term (current) drug therapy: Secondary | ICD-10-CM | POA: Insufficient documentation

## 2017-06-17 DIAGNOSIS — I10 Essential (primary) hypertension: Secondary | ICD-10-CM

## 2017-06-17 DIAGNOSIS — Z8042 Family history of malignant neoplasm of prostate: Secondary | ICD-10-CM | POA: Insufficient documentation

## 2017-06-17 DIAGNOSIS — Z8249 Family history of ischemic heart disease and other diseases of the circulatory system: Secondary | ICD-10-CM | POA: Insufficient documentation

## 2017-06-17 DIAGNOSIS — E10319 Type 1 diabetes mellitus with unspecified diabetic retinopathy without macular edema: Secondary | ICD-10-CM

## 2017-06-17 DIAGNOSIS — Z23 Encounter for immunization: Secondary | ICD-10-CM

## 2017-06-17 DIAGNOSIS — E1065 Type 1 diabetes mellitus with hyperglycemia: Secondary | ICD-10-CM

## 2017-06-17 DIAGNOSIS — Z794 Long term (current) use of insulin: Secondary | ICD-10-CM | POA: Insufficient documentation

## 2017-06-17 LAB — GLUCOSE, POCT (MANUAL RESULT ENTRY): POC GLUCOSE: 141 mg/dL — AB (ref 70–99)

## 2017-06-17 LAB — POCT GLYCOSYLATED HEMOGLOBIN (HGB A1C): HEMOGLOBIN A1C: 8

## 2017-06-17 MED ORDER — INSULIN NPH (HUMAN) (ISOPHANE) 100 UNIT/ML ~~LOC~~ SUSP: SUBCUTANEOUS | 5 refills | Status: DC

## 2017-06-17 MED ORDER — AMLODIPINE BESYLATE 5 MG PO TABS: 5.0000 mg | ORAL_TABLET | Freq: Every day | ORAL | 6 refills | Status: DC

## 2017-06-17 NOTE — Patient Instructions (Addendum)
Increase NPH insulin to 41 units in a.m and 39 units in p.m.  Take Novolog 5 units with large meals. You can take less with small snacks.   Please call me when you have been approved for the [employer] or [employer].  Influenza Virus Vaccine injection (Fluarix) What is this medicine? INFLUENZA VIRUS VACCINE (in floo EN zuh VAHY ruhs vak SEEN) helps to reduce the risk of getting influenza also known as the flu. This medicine may be used for other purposes; ask your health care provider or pharmacist if you have questions. COMMON BRAND NAME(S): Fluarix, Fluzone What should I tell my health care provider before I take this medicine? They need to know if you have any of these conditions: -bleeding disorder like hemophilia -fever or infection -Guillain-Barre syndrome or other neurological problems -immune system problems -infection with the human immunodeficiency virus (HIV) or AIDS -low blood platelet counts -multiple sclerosis -an unusual or allergic reaction to influenza virus vaccine, eggs, chicken proteins, latex, gentamicin, other medicines, foods, dyes or preservatives -pregnant or trying to get pregnant -breast-feeding How should I use this medicine? This vaccine is for injection into a muscle. It is given by a health care professional. A copy of Vaccine Information Statements will be given before each vaccination. Read this sheet carefully each time. The sheet may change frequently. Talk to your pediatrician regarding the use of this medicine in children. Special care may be needed. Overdosage: If you think you have taken too much of this medicine contact a poison control center or emergency room at once. NOTE: This medicine is only for you. Do not share this medicine with others. What if I miss a dose? This does not apply. What may interact with this medicine? -chemotherapy or radiation therapy -medicines that lower your immune system like etanercept, anakinra, infliximab, and  adalimumab -medicines that treat or prevent blood clots like warfarin -phenytoin -steroid medicines like prednisone or cortisone -theophylline -vaccines This list may not describe all possible interactions. Give your health care provider a list of all the medicines, herbs, non-prescription drugs, or dietary supplements you use. Also tell them if you smoke, drink alcohol, or use illegal drugs. Some items may interact with your medicine. What should I watch for while using this medicine? Report any side effects that do not go away within 3 days to your doctor or health care professional. Call your health care provider if any unusual symptoms occur within 6 weeks of receiving this vaccine. You may still catch the flu, but the illness is not usually as bad. You cannot get the flu from the vaccine. The vaccine will not protect against colds or other illnesses that may cause fever. The vaccine is needed every year. What side effects may I notice from receiving this medicine? Side effects that you should report to your doctor or health care professional as soon as possible: -allergic reactions like skin rash, itching or hives, swelling of the face, lips, or tongue Side effects that usually do not require medical attention (report to your doctor or health care professional if they continue or are bothersome): -fever -headache -muscle aches and pains -pain, tenderness, redness, or swelling at site where injected -weak or tired This list may not describe all possible side effects. Call your doctor for medical advice about side effects. You may report side effects to FDA at 1-800-FDA-1088. Where should I keep my medicine? This vaccine is only given in a clinic, pharmacy, doctor's office, or other health care setting and  will not be stored at home. NOTE: This sheet is a summary. It may not cover all possible information. If you have questions about this medicine, talk to your doctor, pharmacist, or health  care provider.  2018 Elsevier/Gold Standard (2008-04-14 09:30:40)

## 2017-06-17 NOTE — Progress Notes (Signed)
Patient ID: Cathy Green, female    DOB: [birth year]  MRN: [id number]  CC: Follow-up   Subjective: Cathy Green is a 36 y.o. 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 History of CKD stage III, HTNand vitamin D deficiency.   1.HTN/CKD stage 3 -had to increase Norvasc back to 5 mg daily because BP start running high again -checking BP 1-2 x a day: range 115/83, 125/81, 113/81 -limits salt in foods -little swelling in legs No CP/SOB -did not see neph as yet. Had to send in some additional documents to our financial specialist to see if she qualifies for [facility] discount  2. DM: -taking NPH 39 units BID, and Novolog 4-6 units with meals -checks BID in a.m and before dinner.  Gives range low 90s to 174.  Mornings usually 90-115, before dinner 130-174. No lows during the day -trying to do better with eating habits - smaller portions and tries to eat breakfast daily; avoids eating after 6-7 p.m.  -Walking 30 minutes to one from work daily -saw her eye doc at [facility]  05/2017. Had inj to RT eye  Patient Active Problem List   Diagnosis Date Noted  . CKD (chronic kidney disease) stage 4, GFR 15-29 ml/min (HCC) 04/26/2017  . Diabetic retinopathy ([place]) 04/26/2017  . Proteinuria 11/01/2015  . Anemia 09/07/2011  . DM (diabetes mellitus), type 1, uncontrolled, with renal complications ([place]) 78/29/5621  . Hyperlipidemia 09/07/2011  . Hypertension 09/07/2011     Current Outpatient Prescriptions on File Prior to Visit  Medication Sig Dispense Refill  . amLODipine (NORVASC) 2.5 MG tablet Take 1 tablet (2.5 mg total) by mouth daily. 90 tablet 3  . atorvastatin (LIPITOR) 10 MG tablet Take 1 tablet (10 mg total) by mouth daily. 90 tablet 3  . Blood Glucose Monitoring Suppl (TRUE METRIX METER) w/Device KIT 37 Units/day by Does not apply route 4 (four) times daily. 100 kit 3  . fluticasone (FLONASE) 50 MCG/ACT  nasal spray Place 2 sprays into both nostrils daily. 16 g 6  . gabapentin (NEURONTIN) 300 MG capsule Take 1 capsule (300 mg total) by mouth at bedtime. For  Foot pain/numbness 30 capsule 3  . glucose blood (TRUE METRIX BLOOD GLUCOSE TEST) test strip Use as instructed 100 each 12  . glucose blood (TRUE METRIX BLOOD GLUCOSE TEST) test strip Use as instructed 100 each 12  . insulin aspart (NOVOLOG) 100 UNIT/ML injection INJECT 0 TO 10 UNITS UNDER THE SKIN 3 TIMES A DAY WITH MEALS 30 mL 3  . insulin NPH Human (HUMULIN N,NOVOLIN N) 100 UNIT/ML injection Inject 0.39 mLs (39 Units total) into the skin 2 (two) times daily before a meal. 10 mL 0  . TRUEPLUS LANCETS 28G MISC 37 Units by Does not apply route 4 (four) times daily. 100 each 3  . Vitamin D, Ergocalciferol, (DRISDOL) 50000 units CAPS capsule Take 1 capsule (50,000 Units total) by mouth every 7 (seven) days. 16 capsule 0   No current facility-administered medications on file prior to visit.     No Known Allergies  Social History   Social History  . Marital status: Single    Spouse name: N/A  . Number of children: N/A  . Years of education: N/A   Occupational History  . Not on file.   Social History Main Topics  . Smoking status: Never Smoker  . Smokeless tobacco: Never Used  . Alcohol use  Yes     Comment: occasional   . Drug use: No  . Sexual activity: No   Other Topics Concern  . Not on file   Social History Narrative   ** Merged History Encounter **        Family History  Problem Relation Age of Onset  . Cancer Maternal Grandfather        prostate  . Hypertension Mother   . Stroke Mother   . Cancer Other   . COPD Other   . Hyperlipidemia Other   . Hypertension Father     Past Surgical History:  Procedure Laterality Date  . WISDOM TOOTH EXTRACTION      ROS: Review of Systems  PHYSICAL EXAM: BP 123/82   Pulse 85   Temp 99.2 F (37.3 C) (Oral)   Resp 16   Wt 158 lb 6.4 oz (71.8 kg)   SpO2 100%    BMI 29.93 kg/m   Physical Exam  General appearance - alert, well appearing, and in no distress Mental status - alert, oriented to person, place, and time, normal mood, behavior, speech, dress, motor activity, and thought processes Neck - supple, no significant adenopathy Chest - clear to auscultation, no wheezes, rales or rhonchi, symmetric air entry Heart - normal rate, regular rhythm, normal S1, S2, no murmurs, rubs, clicks or gallops Extremities - peripheral pulses normal, no pedal edema, no clubbing or cyanosis  Results for orders placed or performed in visit on 06/17/17  POCT glucose (manual entry)  Result Value Ref Range   POC Glucose 141 (A) 70 - 99 mg/dl  POCT glycosylated hemoglobin (Hb A1C)  Result Value Ref Range   Hemoglobin A1C 8.0     .ASSESSMENT AND PLAN: 1. 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 Please bring in BS readings on next visit -increase NPH a.m dose 41units/39 p.m. Take Novolin 5 units with meals unless small snake, can take less Continue healthy eating habits and regular exercise - POCT glucose (manual entry) - POCT glycosylated hemoglobin (Hb A1C)  2. Essential hypertension -at goal. Dose nOrvasc incr back to 5 mg  3. CKD (chronic kidney disease) stage 4, GFR 15-29 ml/min (HCC) -unable to tolerate ACE-I due to hyperK+ -I checked with our financial specialist today to see if she was approved for the [facility] card/[facility] discount. Not associated but she will try to expedite the process. I have asked patient to let me know what she is approved so that we can resubmit the request for nephrology appointment  4. Need for influenza vaccination Given today.  Patient was given the opportunity to ask questions.  Patient verbalized understanding of the plan and was able to repeat key elements of the plan.    Orders Placed This Encounter  Procedures  . POCT glucose (manual entry)  . POCT glycosylated hemoglobin (Hb A1C)     Requested Prescriptions   Pending Prescriptions Disp Refills  . insulin NPH Human (HUMULIN N,NOVOLIN N) 100 UNIT/ML injection 20 mL 5    Sig: 41 units in a.m subcut and 30 units in p.m  . amLODipine (NORVASC) 5 MG tablet 30 tablet 6    Sig: Take 1 tablet (5 mg total) by mouth daily.    Return in about 3 months (around 09/16/2017).  Karle Plumber, MD, FACP

## 2017-06-18 MED FILL — ?HUMULIN N 100 UNITS/ML VIA: 100 | 28 days supply | Qty: 20 | Fill #0

## 2017-06-27 MED FILL — AMLODIPINE BESYLATE 5 MG TA: 5 | 30 days supply | Qty: 30 | Fill #2

## 2017-06-27 MED FILL — ?ATORVASTATIN 10 MG TABLET: 10 | 30 days supply | Qty: 30 | Fill #2

## 2017-08-07 MED FILL — ?ATORVASTATIN 10 MG TABLET: 10 | 30 days supply | Qty: 30 | Fill #3

## 2017-08-07 MED FILL — GABAPENTIN 300 MG CAPSULE: 300 | 30 days supply | Qty: 30 | Fill #1

## 2017-08-07 MED FILL — AMLODIPINE BESYLATE 5 MG TA: 5 | 30 days supply | Qty: 30 | Fill #3

## 2017-08-14 ENCOUNTER — Encounter: Payer: Self-pay | Admitting: Internal Medicine

## 2017-08-14 NOTE — Telephone Encounter (Signed)
Patient email repsonse

## 2017-09-13 MED FILL — AMLODIPINE BESYLATE 5 MG TA: 5 | 30 days supply | Qty: 30 | Fill #4

## 2017-09-13 MED FILL — TRUE METRIX TEST STRIP: 30 days supply | Qty: 100 | Fill #1

## 2017-09-13 MED FILL — ?ATORVASTATIN 10 MG TABLET: 10 | 30 days supply | Qty: 30 | Fill #4

## 2017-09-16 ENCOUNTER — Ambulatory Visit: Payer: Self-pay | Attending: Internal Medicine | Admitting: Internal Medicine

## 2017-09-16 ENCOUNTER — Encounter: Payer: Self-pay | Admitting: Internal Medicine

## 2017-09-16 VITALS — BP 147/83 | HR 67 | Temp 98.6°F | Resp 18 | Ht 61.0 in | Wt 160.0 lb

## 2017-09-16 DIAGNOSIS — Z794 Long term (current) use of insulin: Secondary | ICD-10-CM | POA: Insufficient documentation

## 2017-09-16 DIAGNOSIS — E1042 Type 1 diabetes mellitus with diabetic polyneuropathy: Secondary | ICD-10-CM | POA: Insufficient documentation

## 2017-09-16 DIAGNOSIS — E108 Type 1 diabetes mellitus with unspecified complications: Secondary | ICD-10-CM

## 2017-09-16 DIAGNOSIS — E1022 Type 1 diabetes mellitus with diabetic chronic kidney disease: Secondary | ICD-10-CM | POA: Insufficient documentation

## 2017-09-16 DIAGNOSIS — N184 Chronic kidney disease, stage 4 (severe): Secondary | ICD-10-CM | POA: Insufficient documentation

## 2017-09-16 DIAGNOSIS — I129 Hypertensive chronic kidney disease with stage 1 through stage 4 chronic kidney disease, or unspecified chronic kidney disease: Secondary | ICD-10-CM | POA: Insufficient documentation

## 2017-09-16 DIAGNOSIS — Z79899 Other long term (current) drug therapy: Secondary | ICD-10-CM | POA: Insufficient documentation

## 2017-09-16 DIAGNOSIS — E10319 Type 1 diabetes mellitus with unspecified diabetic retinopathy without macular edema: Secondary | ICD-10-CM | POA: Insufficient documentation

## 2017-09-16 DIAGNOSIS — E559 Vitamin D deficiency, unspecified: Secondary | ICD-10-CM | POA: Insufficient documentation

## 2017-09-16 DIAGNOSIS — I10 Essential (primary) hypertension: Secondary | ICD-10-CM

## 2017-09-16 LAB — GLUCOSE, POCT (MANUAL RESULT ENTRY): POC GLUCOSE: 85 mg/dL (ref 70–99)

## 2017-09-16 LAB — POCT GLYCOSYLATED HEMOGLOBIN (HGB A1C): Hemoglobin A1C: 7

## 2017-09-16 MED ORDER — GABAPENTIN 300 MG PO CAPS: 300.0000 mg | ORAL_CAPSULE | Freq: Two times a day (BID) | ORAL | 3 refills | Status: DC

## 2017-09-16 NOTE — Progress Notes (Signed)
Patient ID: Cathy Green, female    DOB: 01-02-1981  MRN: [id number]  CC: Follow-up   Subjective: Cathy Green is a 36 y.o. female who presents for chronic ds management.  Her concerns today include:  Hx Dm type 1 (dx at age 28) with nephropathy, retinopathy and neuropathy. History of CKD stage III, HTNand vitamin D deficiency.  1.  DM: -checking BS BID. Highest was 153.  15 day average is 129 -eating habits: better but not great. Lives alone; does a lot of microwave meals but cooks some veggies with it. -walks to and from work daily for total of 1 hr + numbness in feet and sharp pains in soles at nights. On Gabapentin 300 mg at bedtime -Eye: had inj RT eye 07/2017 and had LT 08/2017 and 08/2017 by her ophthalmologist at [facility]  2. HTN:  Compliant with Norvasc Checks BP daily.  Range 135/83, 127/84, 126/93, 129/88, 129/89,139/88  3. CKD: has [facility]ount but [facility] does not take the discount Patient Active Problem List   Diagnosis Date Noted  . CKD (chronic kidney disease) stage 4, GFR 15-29 ml/min (HCC) 04/26/2017  . Diabetic retinopathy ([place]) 04/26/2017  . Proteinuria 11/01/2015  . Anemia 09/07/2011  . DM (diabetes mellitus), type 1, uncontrolled, with renal complications ([place]) 59/56/3875  . Hyperlipidemia 09/07/2011  . Hypertension 09/07/2011     Current Outpatient Medications on File Prior to Visit  Medication Sig Dispense Refill  . amLODipine (NORVASC) 5 MG tablet Take 1 tablet (5 mg total) by mouth daily. 30 tablet 6  . atorvastatin (LIPITOR) 10 MG tablet Take 1 tablet (10 mg total) by mouth daily. 90 tablet 3  . Blood Glucose Monitoring Suppl (TRUE METRIX METER) w/Device KIT 37 Units/day by Does not apply route 4 (four) times daily. 100 kit 3  . fluticasone (FLONASE) 50 MCG/ACT nasal spray Place 2 sprays into both nostrils daily. 16 g 6  . glucose blood (TRUE METRIX BLOOD GLUCOSE TEST) test strip Use as instructed 100 each 12  . glucose blood (TRUE  METRIX BLOOD GLUCOSE TEST) test strip Use as instructed 100 each 12  . insulin aspart (NOVOLOG) 100 UNIT/ML injection INJECT 0 TO 10 UNITS UNDER THE SKIN 3 TIMES A DAY WITH MEALS 30 mL 3  . insulin NPH Human (HUMULIN N,NOVOLIN N) 100 UNIT/ML injection 41 units in a.m subcut and 30 units in p.m 20 mL 5  . TRUEPLUS LANCETS 28G MISC 37 Units by Does not apply route 4 (four) times daily. 100 each 3  . Vitamin D, Ergocalciferol, (DRISDOL) 50000 units CAPS capsule Take 1 capsule (50,000 Units total) by mouth every 7 (seven) days. 16 capsule 0   No current facility-administered medications on file prior to visit.     No Known Allergies  Social History   Socioeconomic History  . Marital status: Single    Spouse name: Not on file  . Number of children: Not on file  . Years of education: Not on file  . Highest education level: Not on file  Social Needs  . Financial resource strain: Not on file  . Food insecurity - worry: Not on file  . Food insecurity - inability: Not on file  . Transportation needs - medical: Not on file  . Transportation needs - non-medical: Not on file  Occupational History  . Not on file  Tobacco Use  . Smoking status: Never Smoker  . Smokeless tobacco: Never Used  Substance and Sexual Activity  . Alcohol use: Yes  Comment: occasional   . Drug use: No  . Sexual activity: No    Birth control/protection: Pill  Other Topics Concern  . Not on file  Social History Narrative   ** Merged History Encounter **        Family History  Problem Relation Age of Onset  . Cancer Maternal Grandfather        prostate  . Hypertension Mother   . Stroke Mother   . Cancer Other   . COPD Other   . Hyperlipidemia Other   . Hypertension Father     Past Surgical History:  Procedure Laterality Date  . WISDOM TOOTH EXTRACTION      ROS: Review of Systems Negative except as stated above PHYSICAL EXAM: BP (!) 147/83 (BP Location: Left Arm, Patient Position: Sitting, Cuff  Size: Normal)   Pulse 67   Temp 98.6 F (37 C) (Oral)   Resp 18   Ht '5\' 1"'  (1.549 m)   Wt 160 lb (72.6 kg)   SpO2 100%   BMI 30.23 kg/m   Repeat BP 140/80 Physical Exam  General appearance - alert, well appearing, and in no distress Mental status - alert, oriented to person, place, and time, normal mood, behavior, speech, dress, motor activity, and thought processes Neck - supple, no significant adenopathy Chest - clear to auscultation, no wheezes, rales or rhonchi, symmetric air entry Heart - normal rate, regular rhythm, normal S1, S2, no murmurs, rubs, clicks or gallops Extremities - peripheral pulses normal, no pedal edema, no clubbing or cyanosis   Results for orders placed or performed in visit on 09/16/17  HgB A1c  Result Value Ref Range   Hemoglobin A1C 7.0   Glucose (CBG)  Result Value Ref Range   POC Glucose 85 70 - 99 mg/dl    ASSESSMENT AND PLAN: 1. Diabetes mellitus type 1, controlled, with complications ([place]) Continue current dose of NovoLog and NPH insulin. Continue healthy eating habits.  Continue regular exercise. - HgB A1c - Glucose (CBG)  2. Diabetic polyneuropathy associated with type 1 diabetes mellitus ([place]) Given neuropathy symptoms are worsening we agreed to increase gabapentin to twice a day dosing - gabapentin (NEURONTIN) 300 MG capsule; Take 1 capsule (300 mg total) by mouth 2 (two) times daily. For  Foot pain/numbness  Dispense: 60 capsule; Refill: 3  3. CKD (chronic kidney disease) stage 4, GFR 15-29 ml/min (HCC) -We will try to get her in with neurology at [facility] since [place] [facility] does not take the [facility] discount - Ambulatory referral to Nephrology - Basic Metabolic Panel  4. Essential hypertension Ambulatory BP readings good.  -continue current dose Norvasc and salt restriction  Patient was given the opportunity to ask questions.  Patient verbalized understanding of the plan and was able to repeat key elements of the  plan.   Orders Placed This Encounter  Procedures  . Basic Metabolic Panel  . Ambulatory referral to Nephrology  . HgB A1c  . Glucose (CBG)     Requested Prescriptions   Signed Prescriptions Disp Refills  . gabapentin (NEURONTIN) 300 MG capsule 60 capsule 3    Sig: Take 1 capsule (300 mg total) by mouth 2 (two) times daily. For  Foot pain/numbness    F/u in 3 mths Karle Plumber, MD, Rosalita Chessman

## 2017-09-16 NOTE — Patient Instructions (Signed)
Continue current medications, healthy eating habits and regular exercise.

## 2017-09-17 LAB — BASIC METABOLIC PANEL
BUN / CREAT RATIO: 13 (ref 9–23)
BUN: 32 mg/dL — ABNORMAL HIGH (ref 6–20)
CO2: 19 mmol/L — AB (ref 20–29)
CREATININE: 2.54 mg/dL — AB (ref 0.57–1.00)
Calcium: 9.1 mg/dL (ref 8.7–10.2)
Chloride: 110 mmol/L — ABNORMAL HIGH (ref 96–106)
GFR calc Af Amer: 27 mL/min/{1.73_m2} — ABNORMAL LOW (ref >59)
GFR calc non Af Amer: 24 mL/min/{1.73_m2} — ABNORMAL LOW (ref >59)
GLUCOSE: 80 mg/dL (ref 65–99)
POTASSIUM: 4.7 mmol/L (ref 3.5–5.2)
SODIUM: 142 mmol/L (ref 134–144)

## 2017-10-07 DIAGNOSIS — H409 Unspecified glaucoma: Secondary | ICD-10-CM | POA: Insufficient documentation

## 2017-10-09 DIAGNOSIS — R87810 Cervical high risk human papillomavirus (HPV) DNA test positive: Secondary | ICD-10-CM | POA: Insufficient documentation

## 2017-10-09 DIAGNOSIS — R87610 Atypical squamous cells of undetermined significance on cytologic smear of cervix (ASC-US): Secondary | ICD-10-CM | POA: Insufficient documentation

## 2017-10-09 DIAGNOSIS — R87619 Unspecified abnormal cytological findings in specimens from cervix uteri: Secondary | ICD-10-CM | POA: Insufficient documentation

## 2017-10-28 MED FILL — ?ATORVASTATIN 10 MG TABLET: 10 | 30 days supply | Qty: 30 | Fill #5

## 2017-10-28 MED FILL — ?AMLODIPINE BESYLATE 5 MG T: 5 MG | 30 days supply | Qty: 30 | Fill #5

## 2017-11-15 ENCOUNTER — Ambulatory Visit: Payer: Self-pay | Attending: Internal Medicine

## 2017-12-02 MED FILL — ATORVASTATIN 10 MG TABLET: 10 | 30 days supply | Qty: 30 | Fill #6

## 2017-12-02 MED FILL — AMLODIPINE BESYLATE 5 MG TA: 5 | 30 days supply | Qty: 30 | Fill #6

## 2017-12-04 ENCOUNTER — Other Ambulatory Visit: Payer: Self-pay | Admitting: Obstetrics and Gynecology

## 2017-12-11 ENCOUNTER — Encounter: Payer: Self-pay | Admitting: Internal Medicine

## 2017-12-11 NOTE — Progress Notes (Signed)
Received note from [place].  Patient had Pap smear done 10/08/2017.  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  Patient underwent colposcopy 12/04/2017.

## 2018-01-02 MED FILL — ?ATORVASTATIN 10 MG TABLET: 10 | 30 days supply | Qty: 30 | Fill #7

## 2018-01-02 MED FILL — ?AMLODIPINE BESYLATE 5 MG T: 5 MG | 30 days supply | Qty: 30 | Fill #7

## 2018-02-04 MED FILL — ?ATORVASTATIN 10 MG TABLET: 10 | 30 days supply | Qty: 30 | Fill #8

## 2018-02-04 MED FILL — FLUTICASONE PROP 50 MCG SPR: 50 | 30 days supply | Qty: 16 | Fill #1

## 2018-02-04 MED FILL — ?AMLODIPINE BESYLATE 5 MG T: 5 MG | 30 days supply | Qty: 30 | Fill #8

## 2018-02-04 MED FILL — ?HUMULIN N 100 UNITS/ML VIA: 100 | 28 days supply | Qty: 20 | Fill #1

## 2018-03-07 MED FILL — ATORVASTATIN 10 MG TABLET: 10 | 30 days supply | Qty: 30 | Fill #9

## 2018-03-07 MED FILL — AMLODIPINE BESYLATE 5 MG TA: 5 | 30 days supply | Qty: 30 | Fill #9

## 2018-05-02 ENCOUNTER — Ambulatory Visit: Payer: Self-pay | Attending: Family Medicine

## 2018-05-27 ENCOUNTER — Ambulatory Visit: Payer: Self-pay | Attending: Internal Medicine | Admitting: Internal Medicine

## 2018-05-27 ENCOUNTER — Encounter: Payer: Self-pay | Admitting: Internal Medicine

## 2018-05-27 VITALS — BP 166/97 | HR 86 | Temp 98.5°F | Resp 16 | Ht 61.0 in | Wt 153.2 lb

## 2018-05-27 DIAGNOSIS — E1065 Type 1 diabetes mellitus with hyperglycemia: Secondary | ICD-10-CM

## 2018-05-27 DIAGNOSIS — I10 Essential (primary) hypertension: Secondary | ICD-10-CM

## 2018-05-27 DIAGNOSIS — E108 Type 1 diabetes mellitus with unspecified complications: Secondary | ICD-10-CM

## 2018-05-27 DIAGNOSIS — N184 Chronic kidney disease, stage 4 (severe): Secondary | ICD-10-CM

## 2018-05-27 DIAGNOSIS — E1029 Type 1 diabetes mellitus with other diabetic kidney complication: Secondary | ICD-10-CM

## 2018-05-27 DIAGNOSIS — Z23 Encounter for immunization: Secondary | ICD-10-CM

## 2018-05-27 DIAGNOSIS — Z823 Family history of stroke: Secondary | ICD-10-CM | POA: Insufficient documentation

## 2018-05-27 DIAGNOSIS — Z79899 Other long term (current) drug therapy: Secondary | ICD-10-CM | POA: Insufficient documentation

## 2018-05-27 DIAGNOSIS — E11649 Type 2 diabetes mellitus with hypoglycemia without coma: Secondary | ICD-10-CM

## 2018-05-27 DIAGNOSIS — I129 Hypertensive chronic kidney disease with stage 1 through stage 4 chronic kidney disease, or unspecified chronic kidney disease: Secondary | ICD-10-CM | POA: Insufficient documentation

## 2018-05-27 DIAGNOSIS — E785 Hyperlipidemia, unspecified: Secondary | ICD-10-CM

## 2018-05-27 DIAGNOSIS — E1022 Type 1 diabetes mellitus with diabetic chronic kidney disease: Secondary | ICD-10-CM | POA: Insufficient documentation

## 2018-05-27 DIAGNOSIS — D649 Anemia, unspecified: Secondary | ICD-10-CM | POA: Insufficient documentation

## 2018-05-27 DIAGNOSIS — E1040 Type 1 diabetes mellitus with diabetic neuropathy, unspecified: Secondary | ICD-10-CM | POA: Insufficient documentation

## 2018-05-27 DIAGNOSIS — Z8249 Family history of ischemic heart disease and other diseases of the circulatory system: Secondary | ICD-10-CM | POA: Insufficient documentation

## 2018-05-27 DIAGNOSIS — E559 Vitamin D deficiency, unspecified: Secondary | ICD-10-CM | POA: Insufficient documentation

## 2018-05-27 DIAGNOSIS — E10311 Type 1 diabetes mellitus with unspecified diabetic retinopathy with macular edema: Secondary | ICD-10-CM

## 2018-05-27 DIAGNOSIS — Z794 Long term (current) use of insulin: Secondary | ICD-10-CM | POA: Insufficient documentation

## 2018-05-27 LAB — GLUCOSE, POCT (MANUAL RESULT ENTRY)
POC GLUCOSE: 102 mg/dL — AB (ref 70–99)
POC GLUCOSE: 69 mg/dL — AB (ref 70–99)
POC Glucose: 54 mg/dl — AB (ref 70–99)
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
## 2018-05-27 MED ORDER — CARVEDILOL 3.125 MG PO TABS: 3.1250 mg | ORAL_TABLET | Freq: Two times a day (BID) | ORAL | 5 refills | Status: DC

## 2018-05-27 MED ORDER — FLUTICASONE PROPIONATE 50 MCG/ACT NA SUSP: 2.0000 | Freq: Every day | NASAL | 6 refills | Status: DC

## 2018-05-27 MED ORDER — GLUCOSE 40 % PO GEL: 1.0000 | Freq: Once | ORAL | Status: DC

## 2018-05-27 MED ORDER — INSTA-GLUCOSE 77.4 % PO GEL
1.0000 | Freq: Once | ORAL | Status: AC
  Administered 2018-05-27: 1 via ORAL

## 2018-05-27 MED ORDER — ATORVASTATIN CALCIUM 10 MG PO TABS: 10.0000 mg | ORAL_TABLET | Freq: Every day | ORAL | 3 refills | Status: DC

## 2018-05-27 MED ORDER — AMLODIPINE BESYLATE 5 MG PO TABS: 5.0000 mg | ORAL_TABLET | Freq: Every day | ORAL | 6 refills | Status: DC

## 2018-05-27 MED FILL — ATORVASTATIN 10 MG TABLET: 10 | 90 days supply | Qty: 90 | Fill #0

## 2018-05-27 MED FILL — CARVEDILOL 3.125 MG TABLET: 3.125 | 30 days supply | Qty: 60 | Fill #0

## 2018-05-27 MED FILL — FLUTICASONE PROP 50 MCG SPR: 50 | 30 days supply | Qty: 16 | Fill #0

## 2018-05-27 MED FILL — AMLODIPINE BESYLATE 5 MG TA: 5 | 30 days supply | Qty: 30 | Fill #0

## 2018-05-27 NOTE — Patient Instructions (Addendum)
Start Carvedilol 3.125 mg twice a day for better control of blood pressure.     Hypoglycemia Hypoglycemia is when the sugar (glucose) level in the blood is too low. Symptoms of low blood sugar may include:  Feeling: ? Hungry. ? Worried or nervous (anxious). ? Sweaty and clammy. ? Confused. ? Dizzy. ? Sleepy. ? Sick to your stomach (nauseous).  Having: ? A fast heartbeat. ? A headache. ? A change in your vision. ? Jerky movements that you cannot control (seizure). ? Nightmares. ? Tingling or no feeling (numbness) around the mouth, lips, or tongue.  Having trouble with: ? Talking. ? Paying attention (concentrating). ? Moving (coordination). ? Sleeping.  Shaking.  Passing out (fainting).  Getting upset easily (irritability).  Low blood sugar can happen to people who have diabetes and people who do not have diabetes. Low blood sugar can happen quickly, and it can be an emergency. Treating Low Blood Sugar Low blood sugar is often treated by eating or drinking something sugary right away. If you can think clearly and swallow safely, follow the 15:15 rule:  Take 15 grams of a fast-acting carb (carbohydrate). Some fast-acting carbs are: ? 1 tube of glucose gel. ? 3 sugar tablets (glucose pills). ? 6-8 pieces of hard candy. ? 4 oz (120 mL) of fruit juice. ? 4 oz (120 mL) of regular (not diet) soda.  Check your blood sugar 15 minutes after you take the carb.  If your blood sugar is still at or below 70 mg/dL (3.9 mmol/L), take 15 grams of a carb again.  If your blood sugar does not go above 70 mg/dL (3.9 mmol/L) after 3 tries, get help right away.  After your blood sugar goes back to normal, eat a meal or a snack within 1 hour.  Treating Very Low Blood Sugar If your blood sugar is at or below 54 mg/dL (3 mmol/L), you have very low blood sugar (severe hypoglycemia). This is an emergency. Do not wait to see if the symptoms will go away. Get medical help right away. Call  your local emergency services (911 in the [place] Do not drive yourself to the hospital. If you have very low blood sugar and you cannot eat or drink, you may need a glucagon shot (injection). A family member or friend should learn how to check your blood sugar and how to give you a glucagon shot. Ask your doctor if you need to have a glucagon shot kit at home. Follow these instructions at home: General instructions  Avoid any diets that cause you to not eat enough food. Talk with your doctor before you start any new diet.  Take over-the-counter and prescription medicines only as told by your doctor.  Limit alcohol to no more than 1 drink per day for nonpregnant women and 2 drinks per day for men. One drink equals 12 oz of beer, 5 oz of wine, or 1 oz of hard liquor.  Keep all follow-up visits as told by your doctor. This is important. If You Have Diabetes:   Make sure you know the symptoms of low blood sugar.  Always keep a source of sugar with you, such as: ? Sugar. ? Sugar tablets. ? Glucose gel. ? Fruit juice. ? Regular soda (not diet soda). ? Milk. ? Hard candy. ? Honey.  Take your medicines as told.  Follow your exercise and meal plan. ? Eat on time. Do not skip meals. ? Follow your sick day plan when you cannot eat or drink  normally. Make this plan ahead of time with your doctor.  Check your blood sugar as often as told by your doctor. Always check before and after exercise.  Share your diabetes care plan with: ? Your work or school. ? People you live with.  Check your pee (urine) for ketones: ? When you are sick. ? As told by your doctor.  Carry a card or wear jewelry that says you have diabetes. If You Have Low Blood Sugar From Other Causes:   Check your blood sugar as often as told by your doctor.  Follow instructions from your doctor about what you cannot eat or drink. Contact a doctor if:  You have trouble keeping your blood sugar in your target  range.  You have low blood sugar often. Get help right away if:  You still have symptoms after you eat or drink something sugary.  Your blood sugar is at or below 54 mg/dL (3 mmol/L).  You have jerky movements that you cannot control.  You pass out. These symptoms may be an emergency. Do not wait to see if the symptoms will go away. Get medical help right away. Call your local emergency services (911 in the [place] Do not drive yourself to the hospital. This information is not intended to replace advice given to you by your health care provider. Make sure you discuss any questions you have with your health care provider. Document Released: 12/12/2009 Document Revised: 02/23/2016 Document Reviewed: 10/21/2015 Elsevier Interactive Patient Education  2018 [place].   Influenza Virus Vaccine injection (Fluarix) What is this medicine? INFLUENZA VIRUS VACCINE (in floo EN zuh VAHY ruhs vak SEEN) helps to reduce the risk of getting influenza also known as the flu. This medicine may be used for other purposes; ask your health care provider or pharmacist if you have questions. COMMON BRAND NAME(S): Fluarix, Fluzone What should I tell my health care provider before I take this medicine? They need to know if you have any of these conditions: -bleeding disorder like hemophilia -fever or infection -Guillain-Barre syndrome or other neurological problems -immune system problems -infection with the human immunodeficiency virus (HIV) or AIDS -low blood platelet counts -multiple sclerosis -an unusual or allergic reaction to influenza virus vaccine, eggs, chicken proteins, latex, gentamicin, other medicines, foods, dyes or preservatives -pregnant or trying to get pregnant -breast-feeding How should I use this medicine? This vaccine is for injection into a muscle. It is given by a health care professional. A copy of Vaccine Information Statements will be given before each vaccination. Read this  sheet carefully each time. The sheet may change frequently. Talk to your pediatrician regarding the use of this medicine in children. Special care may be needed. Overdosage: If you think you have taken too much of this medicine contact a poison control center or emergency room at once. NOTE: This medicine is only for you. Do not share this medicine with others. What if I miss a dose? This does not apply. What may interact with this medicine? -chemotherapy or radiation therapy -medicines that lower your immune system like etanercept, anakinra, infliximab, and adalimumab -medicines that treat or prevent blood clots like warfarin -phenytoin -steroid medicines like prednisone or cortisone -theophylline -vaccines This list may not describe all possible interactions. Give your health care provider a list of all the medicines, herbs, non-prescription drugs, or dietary supplements you use. Also tell them if you smoke, drink alcohol, or use illegal drugs. Some items may interact with your medicine. What should I watch  for while using this medicine? Report any side effects that do not go away within 3 days to your doctor or health care professional. Call your health care provider if any unusual symptoms occur within 6 weeks of receiving this vaccine. You may still catch the flu, but the illness is not usually as bad. You cannot get the flu from the vaccine. The vaccine will not protect against colds or other illnesses that may cause fever. The vaccine is needed every year. What side effects may I notice from receiving this medicine? Side effects that you should report to your doctor or health care professional as soon as possible: -allergic reactions like skin rash, itching or hives, swelling of the face, lips, or tongue Side effects that usually do not require medical attention (report to your doctor or health care professional if they continue or are bothersome): -fever -headache -muscle aches and  pains -pain, tenderness, redness, or swelling at site where injected -weak or tired This list may not describe all possible side effects. Call your doctor for medical advice about side effects. You may report side effects to FDA at 1-800-FDA-1088. Where should I keep my medicine? This vaccine is only given in a clinic, pharmacy, doctor's office, or other health care setting and will not be stored at home. NOTE: This sheet is a summary. It may not cover all possible information. If you have questions about this medicine, talk to your doctor, pharmacist, or health care provider.  2018 Elsevier/Gold Standard (2008-04-14 09:30:40)

## 2018-05-27 NOTE — Progress Notes (Signed)
Patient ID: Cathy Green, female    DOB: 18-May-1981  MRN: [id number]  CC:  Chronic ds management  Subjective: Cathy Green is a 37 y.o. 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 History of CKD stage III, HTNand vitamin D deficiency.  DM:  Checks BID in a.m and before dinner.  A.m range 95-108, evenings in 140s.  Blood sugar low in the office today.  She reports 2 episodes of low blood sugars in the past month with the lowest being 74. Eating habits:  3 meals a day and tries to control portion sizes. Meds: not having to use Novolog but takes NPH consistently.  Last used NovoLog about 1 mth ago Saw eye specialist in June.  Numbness in hands at times.  Feet have not been bothersome.  She discontinued taking gabapentin  HTN:  Checks BP in a.m. she has some ranges for me: 158/93, 142/92, 152/90, 123/79 this a.m She never did get in to see a nephrologist.  She did get the orange card last year but the nephrologists here locally in [place] did not take the orange card -No lower extremity edema.  Makes good urine. Patient Active Problem List   Diagnosis Date Noted  . CKD (chronic kidney disease) stage 4, GFR 15-29 ml/min (HCC) 04/26/2017  . Diabetic retinopathy ([place]) 04/26/2017  . Proteinuria 11/01/2015  . Anemia 09/07/2011  . DM (diabetes mellitus), type 1, uncontrolled, with renal complications ([place]) 68/34/1962  . Hyperlipidemia 09/07/2011  . Hypertension 09/07/2011     Current Outpatient Medications on File Prior to Visit  Medication Sig Dispense Refill  . Blood Glucose Monitoring Suppl (TRUE METRIX METER) w/Device KIT 37 Units/day by Does not apply route 4 (four) times daily. 100 kit 3  . gabapentin (NEURONTIN) 300 MG capsule Take 1 capsule (300 mg total) by mouth 2 (two) times daily. For  Foot pain/numbness 60 capsule 3  . glucose blood (TRUE METRIX BLOOD GLUCOSE TEST)  test strip Use as instructed 100 each 12  . glucose blood (TRUE METRIX BLOOD GLUCOSE TEST) test strip Use as instructed 100 each 12  . insulin aspart (NOVOLOG) 100 UNIT/ML injection INJECT 0 TO 10 UNITS UNDER THE SKIN 3 TIMES A DAY WITH MEALS 30 mL 3  . insulin NPH Human (HUMULIN N,NOVOLIN N) 100 UNIT/ML injection 41 units in a.m subcut and 30 units in p.m 20 mL 5  . TRUEPLUS LANCETS 28G MISC 37 Units by Does not apply route 4 (four) times daily. 100 each 3  . Vitamin D, Ergocalciferol, (DRISDOL) 50000 units CAPS capsule Take 1 capsule (50,000 Units total) by mouth every 7 (seven) days. 16 capsule 0   No current facility-administered medications on file prior to visit.     No Known Allergies  Social History   Socioeconomic History  . Marital status: Single    Spouse name: Not on file  . Number of children: Not on file  . Years of education: Not on file  . Highest education level: Not on file  Occupational History  . Not on file  Social Needs  . Financial resource strain: Not on file  . Food insecurity:    Worry: Not on file    Inability: Not on file  . Transportation needs:    Medical: Not on file    Non-medical: Not on file  Tobacco Use  . Smoking status: Never Smoker  . Smokeless  tobacco: Never Used  Substance and Sexual Activity  . Alcohol use: Yes    Comment: occasional   . Drug use: No  . Sexual activity: Never    Birth control/protection: Pill  Lifestyle  . Physical activity:    Days per week: Not on file    Minutes per session: Not on file  . Stress: Not on file  Relationships  . Social connections:    Talks on phone: Not on file    Gets together: Not on file    Attends religious service: Not on file    Active member of club or organization: Not on file    Attends meetings of clubs or organizations: Not on file    Relationship status: Not on file  . Intimate partner violence:    Fear of current or ex partner: Not on file    Emotionally abused: Not on file      Physically abused: Not on file    Forced sexual activity: Not on file  Other Topics Concern  . Not on file  Social History Narrative   ** Merged History Encounter **        Family History  Problem Relation Age of Onset  . Cancer Maternal Grandfather        prostate  . Hypertension Mother   . Stroke Mother   . Cancer Other   . COPD Other   . Hyperlipidemia Other   . Hypertension Father     Past Surgical History:  Procedure Laterality Date  . WISDOM TOOTH EXTRACTION      ROS: Review of Systems Negative except as above. PHYSICAL EXAM: BP (!) 166/97   Pulse 86   Temp 98.5 F (36.9 C) (Oral)   Resp 16   Ht '5\' 1"'  (1.549 m)   Wt 153 lb 3.2 oz (69.5 kg)   SpO2 96%   BMI 28.95 kg/m   Physical Exam General appearance - alert, well appearing, and in no distress Mental status - normal mood, behavior, speech, dress, motor activity, and thought processes Neck - supple, no significant adenopathy Chest - clear to auscultation, no wheezes, rales or rhonchi, symmetric air entry Heart - normal rate, regular rhythm, normal S1, S2, no murmurs, rubs, clicks or gallops Extremities - peripheral pulses normal, no pedal edema, no clubbing or cyanosis   ASSESSMENT AND PLAN: 1. DM (diabetes mellitus), type 1, controlled, with renal complications (HCC) Continue NPH insulin.  Check A1c to see where her range lies. Continue healthy eating habits. - POCT glucose (manual entry) - Hemoglobin A1c - Microalbumin / creatinine urine ratio - Lipid Panel - Comprehensive metabolic panel - INSTA-GLUCOSE 77.4 % GEL 1 Tube - CBC - POCT glucose (manual entry) - POCT glucose (manual entry)  2. Essential hypertension Not at goal based on home ambulatory readings.  Will add low-dose carvedilol. - amLODipine (NORVASC) 5 MG tablet; Take 1 tablet (5 mg total) by mouth daily.  Dispense: 30 tablet; Refill: 6 - carvedilol (COREG) 3.125 MG tablet; Take 1 tablet (3.125 mg total) by mouth 2 (two) times  daily with a meal.  Dispense: 60 tablet; Refill: 5  3. CKD (chronic kidney disease) stage 4, GFR 15-29 ml/min ([place]) - Ambulatory referral to Nephrology Message sent to referral coordinator to try to get her in at [facility]  4. 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  5. Hypoglycemic episode in patient  with diabetes mellitus ([place]) Patient given glucose gel today to bring her blood sugar up.  6. Hyperlipidemia, unspecified hyperlipidemia type Continue atorvastatin  7. Need for influenza vaccination - Flu Vaccine QUAD 6+ mos PF IM (Fluarix Quad PF)    Patient was given the opportunity to ask questions.  Patient verbalized understanding of the plan and was able to repeat key elements of the plan.   Orders Placed This Encounter  Procedures  . Flu Vaccine QUAD 6+ mos PF IM (Fluarix Quad PF)  . Hemoglobin A1c  . Microalbumin / creatinine urine ratio  . Lipid Panel  . Comprehensive metabolic panel  . CBC  . Ambulatory referral to Nephrology  . POCT glucose (manual entry)  . POCT glucose (manual entry)  . POCT glucose (manual entry)     Requested Prescriptions   Signed Prescriptions Disp Refills  . atorvastatin (LIPITOR) 10 MG tablet 90 tablet 3    Sig: Take 1 tablet (10 mg total) by mouth daily.  Marland Kitchen amLODipine (NORVASC) 5 MG tablet 30 tablet 6    Sig: Take 1 tablet (5 mg total) by mouth daily.  . fluticasone (FLONASE) 50 MCG/ACT nasal spray 16 g 6    Sig: Place 2 sprays into both nostrils daily.  . carvedilol (COREG) 3.125 MG tablet 60 tablet 5    Sig: Take 1 tablet (3.125 mg total) by mouth 2 (two) times daily with a meal.    Return in about 3 months (around 08/27/2018).  Karle Plumber, MD, FACP

## 2018-05-28 ENCOUNTER — Telehealth: Payer: Self-pay | Admitting: Internal Medicine

## 2018-05-28 DIAGNOSIS — E875 Hyperkalemia: Secondary | ICD-10-CM

## 2018-05-28 LAB — COMPREHENSIVE METABOLIC PANEL
ALT: 15 IU/L (ref 0–32)
AST: 15 IU/L (ref 0–40)
Albumin/Globulin Ratio: 1.3 (ref 1.2–2.2)
Albumin: 4 g/dL (ref 3.5–5.5)
Alkaline Phosphatase: 63 IU/L (ref 39–117)
BUN/Creatinine Ratio: 11 (ref 9–23)
BUN: 38 mg/dL — AB (ref 6–20)
Bilirubin Total: <0.2 mg/dL (ref 0.0–1.2)
CALCIUM: 8.9 mg/dL (ref 8.7–10.2)
CO2: 17 mmol/L — ABNORMAL LOW (ref 20–29)
Chloride: 110 mmol/L — ABNORMAL HIGH (ref 96–106)
Creatinine, Ser: 3.61 mg/dL — ABNORMAL HIGH (ref 0.57–1.00)
GFR, EST AFRICAN AMERICAN: 18 mL/min/{1.73_m2} — AB (ref >59)
GFR, EST NON AFRICAN AMERICAN: 15 mL/min/{1.73_m2} — AB (ref >59)
GLUCOSE: 74 mg/dL (ref 65–99)
Globulin, Total: 3 g/dL (ref 1.5–4.5)
Potassium: 5.5 mmol/L — ABNORMAL HIGH (ref 3.5–5.2)
Sodium: 142 mmol/L (ref 134–144)
TOTAL PROTEIN: 7 g/dL (ref 6.0–8.5)

## 2018-05-28 LAB — LIPID PANEL
CHOLESTEROL TOTAL: 227 mg/dL — AB (ref 100–199)
Chol/HDL Ratio: 3.8 ratio (ref 0.0–4.4)
HDL: 59 mg/dL (ref >39)
LDL Calculated: 140 mg/dL — ABNORMAL HIGH (ref 0–99)
TRIGLYCERIDES: 138 mg/dL (ref 0–149)
VLDL Cholesterol Cal: 28 mg/dL (ref 5–40)

## 2018-05-28 LAB — CBC
HEMATOCRIT: 29.3 % — AB (ref 34.0–46.6)
HEMOGLOBIN: 9.2 g/dL — AB (ref 11.1–15.9)
MCH: 26.4 pg — AB (ref 26.6–33.0)
MCHC: 31.4 g/dL — AB (ref 31.5–35.7)
MCV: 84 fL (ref 79–97)
Platelets: 202 10*3/uL (ref 150–450)
RBC: 3.48 x10E6/uL — ABNORMAL LOW (ref 3.77–5.28)
RDW: 14.3 % (ref 12.3–15.4)
WBC: 4.9 10*3/uL (ref 3.4–10.8)

## 2018-05-28 LAB — MICROALBUMIN / CREATININE URINE RATIO
CREATININE, UR: 236.7 mg/dL
MICROALB/CREAT RATIO: 1064.8 mg/g{creat} — AB (ref 0.0–30.0)
MICROALBUM., U, RANDOM: 2520.3 ug/mL

## 2018-05-28 LAB — HEMOGLOBIN A1C
ESTIMATED AVERAGE GLUCOSE: 140 mg/dL
Hgb A1c MFr Bld: 6.5 % — ABNORMAL HIGH (ref 4.8–5.6)

## 2018-05-28 NOTE — Telephone Encounter (Signed)
PC placed to pt this a.m. Detailed message left on VM with lab results.  Also informed her that results were sent to Mychart account.  She needs repeat K+ level.  Order placed.

## 2018-05-29 ENCOUNTER — Telehealth: Payer: Self-pay | Admitting: Internal Medicine

## 2018-05-29 ENCOUNTER — Ambulatory Visit: Payer: Self-pay | Attending: Internal Medicine

## 2018-05-29 DIAGNOSIS — E875 Hyperkalemia: Secondary | ICD-10-CM

## 2018-05-29 NOTE — Progress Notes (Signed)
Patient here for lab visit only 

## 2018-05-29 NOTE — Telephone Encounter (Signed)
-----   Message from Ena Dawley sent at 05/28/2018  4:49 PM EDT ----- Noted   Referral was sent today and patient need to apply for financial  With [facility] .  ----- Message ----- From: Ladell Pier, MD Sent: 05/27/2018   6:16 PM EDT To: Ena Dawley  Can we try to get her to nephrology at [facility]?

## 2018-05-30 ENCOUNTER — Telehealth: Payer: Self-pay | Admitting: Internal Medicine

## 2018-05-30 LAB — POTASSIUM: Potassium: 6.9 mmol/L (ref 3.5–5.2)

## 2018-05-30 NOTE — Telephone Encounter (Signed)
Phone call placed to patient this a.m.  I left a message on her voicemail informing her that her potassium level has increased further to 6.9.  I recommend that she be seen in the emergency room to be checked and given medication to bring the potassium level down.  This underscores the importance of making sure she gets in with an nephrologist as repeated hyperkalemia can occur in CKD especially when patient is nearing dialysis.  Advised that she cut back on eating potassium rich foods including oranges and orange juice.  Advised that she look online to get a list of other potassium rich foods to avoid. -

## 2018-05-31 ENCOUNTER — Other Ambulatory Visit: Payer: Self-pay

## 2018-05-31 ENCOUNTER — Emergency Department (HOSPITAL_COMMUNITY)
Admission: EM | Admit: 2018-05-31 | Discharge: 2018-05-31 | Disposition: A | Discharge status: Home / Self Care | Payer: Self-pay | Attending: Emergency Medicine | Admitting: Emergency Medicine

## 2018-05-31 ENCOUNTER — Encounter (HOSPITAL_COMMUNITY): Payer: Self-pay | Admitting: Emergency Medicine

## 2018-05-31 DIAGNOSIS — E109 Type 1 diabetes mellitus without complications: Secondary | ICD-10-CM | POA: Insufficient documentation

## 2018-05-31 DIAGNOSIS — Z794 Long term (current) use of insulin: Secondary | ICD-10-CM | POA: Insufficient documentation

## 2018-05-31 DIAGNOSIS — N289 Disorder of kidney and ureter, unspecified: Secondary | ICD-10-CM

## 2018-05-31 DIAGNOSIS — N184 Chronic kidney disease, stage 4 (severe): Secondary | ICD-10-CM | POA: Insufficient documentation

## 2018-05-31 DIAGNOSIS — Z79899 Other long term (current) drug therapy: Secondary | ICD-10-CM | POA: Insufficient documentation

## 2018-05-31 DIAGNOSIS — I129 Hypertensive chronic kidney disease with stage 1 through stage 4 chronic kidney disease, or unspecified chronic kidney disease: Secondary | ICD-10-CM | POA: Insufficient documentation

## 2018-05-31 LAB — I-STAT CHEM 8, ED
BUN: 41 mg/dL — ABNORMAL HIGH (ref 6–20)
Calcium, Ion: 1.14 mmol/L — ABNORMAL LOW (ref 1.15–1.40)
Chloride: 115 mmol/L — ABNORMAL HIGH (ref 98–111)
Creatinine, Ser: 3.7 mg/dL — ABNORMAL HIGH (ref 0.44–1.00)
Glucose, Bld: 126 mg/dL — ABNORMAL HIGH (ref 70–99)
HCT: 34 % — ABNORMAL LOW (ref 36.0–46.0)
Hemoglobin: 11.6 g/dL — ABNORMAL LOW (ref 12.0–15.0)
Potassium: 4.9 mmol/L (ref 3.5–5.1)
Sodium: 140 mmol/L (ref 135–145)
TCO2: 18 mmol/L — ABNORMAL LOW (ref 22–32)

## 2018-05-31 LAB — COMPREHENSIVE METABOLIC PANEL
ALK PHOS: 65 U/L (ref 38–126)
ALT: 15 U/L (ref 0–44)
AST: 17 U/L (ref 15–41)
Albumin: 3.9 g/dL (ref 3.5–5.0)
Anion gap: 9 (ref 5–15)
BUN: 41 mg/dL — AB (ref 6–20)
CALCIUM: 9.2 mg/dL (ref 8.9–10.3)
CO2: 19 mmol/L — AB (ref 22–32)
Chloride: 109 mmol/L (ref 98–111)
Creatinine, Ser: 3.18 mg/dL — ABNORMAL HIGH (ref 0.44–1.00)
GFR calc Af Amer: 20 mL/min — ABNORMAL LOW (ref >60)
GFR calc non Af Amer: 18 mL/min — ABNORMAL LOW (ref >60)
Glucose, Bld: 131 mg/dL — ABNORMAL HIGH (ref 70–99)
Potassium: 4.9 mmol/L (ref 3.5–5.1)
Sodium: 137 mmol/L (ref 135–145)
Total Bilirubin: 0.5 mg/dL (ref 0.3–1.2)
Total Protein: 7.7 g/dL (ref 6.5–8.1)

## 2018-05-31 LAB — CBC WITH DIFFERENTIAL/PLATELET
ABS IMMATURE GRANULOCYTES: 0 10*3/uL (ref 0.0–0.1)
Basophils Absolute: 0 10*3/uL (ref 0.0–0.1)
Basophils Relative: 1 %
EOS PCT: 2 %
Eosinophils Absolute: 0.1 10*3/uL (ref 0.0–0.7)
HEMATOCRIT: 36.5 % (ref 36.0–46.0)
HEMOGLOBIN: 11 g/dL — AB (ref 12.0–15.0)
Immature Granulocytes: 0 %
LYMPHS ABS: 1.3 10*3/uL (ref 0.7–4.0)
Lymphocytes Relative: 37 %
MCH: 26.8 pg (ref 26.0–34.0)
MCHC: 30.1 g/dL (ref 30.0–36.0)
MCV: 89 fL (ref 78.0–100.0)
MONO ABS: 0.3 10*3/uL (ref 0.1–1.0)
MONOS PCT: 7 %
Neutro Abs: 1.9 10*3/uL (ref 1.7–7.7)
Neutrophils Relative %: 53 %
Platelets: 211 10*3/uL (ref 150–400)
RBC: 4.1 MIL/uL (ref 3.87–5.11)
RDW: 13 % (ref 11.5–15.5)
WBC: 3.6 10*3/uL — ABNORMAL LOW (ref 4.0–10.5)

## 2018-05-31 LAB — I-STAT BETA HCG BLOOD, ED (MC, WL, AP ONLY): I-stat hCG, quantitative: <5 m[IU]/mL (ref <5)

## 2018-05-31 LAB — I-STAT TROPONIN, ED: TROPONIN I, POC: 0 ng/mL (ref 0.00–0.08)

## 2018-05-31 NOTE — Discharge Instructions (Addendum)
Return here as needed.  Follow-up with your doctor as soon as possible for referral to nephrology.

## 2018-05-31 NOTE — ED Triage Notes (Signed)
Pt reports she had blood drawn on Thursday, was told to come to ED because potassium was critically high (6.9) per results in chart.

## 2018-05-31 NOTE — ED Provider Notes (Signed)
[facility] EMERGENCY DEPARTMENT Provider Note   CSN: [id number] Arrival date & time: 05/31/18  1655     History   Chief Complaint Chief Complaint  Patient presents with  . Abnormal Lab    HPI Cathy Green is a 37 y.o. female.  HPI Patient presents to the emergency department with an abnormal lab that was obtained at her doctor's office.  The patient states that labs show that her potassium was 6.9.  Patient states that she is been waiting to get into a nephrologist for the last several months.  The patient states that nothing has been abnormal other than an occasional palpitation over the last 6 to 8 months.  The patient denies chest pain, shortness of breath, headache,blurred vision, neck pain, fever, cough, weakness, numbness, dizziness, anorexia, edema, abdominal pain, nausea, vomiting, diarrhea, rash, back pain, dysuria, hematemesis, bloody stool, near syncope, or syncope. Past Medical History:  Diagnosis Date  . Abnormal Pap smear   . Allergy    seasonal allergies  . Anemia   . Chronic kidney disease   . Diabetes mellitus    diagnosed age 38  . Diabetes mellitus without complication ([place])   . Glaucoma   . Hyperlipidemia   . Hypertension   . Migraines   . Noncompliance   . Numbness in left leg     Patient Active Problem List   Diagnosis Date Noted  . CKD (chronic kidney disease) stage 4, GFR 15-29 ml/min (HCC) 04/26/2017  . Diabetic retinopathy ([place]) 04/26/2017  . Proteinuria 11/01/2015  . Anemia 09/07/2011  . DM (diabetes mellitus), type 1, uncontrolled, with renal complications ([place]) 37/48/2707  . Hyperlipidemia 09/07/2011  . Hypertension 09/07/2011    Past Surgical History:  Procedure Laterality Date  . WISDOM TOOTH EXTRACTION       OB History   None      Home Medications    Prior to Admission medications   Medication Sig Start Date End Date Taking? Authorizing Provider  amLODipine (NORVASC) 5 MG tablet Take 1 tablet (5 mg  total) by mouth daily. 05/27/18  Yes Ladell Pier, MD  atorvastatin (LIPITOR) 10 MG tablet Take 1 tablet (10 mg total) by mouth daily. Patient taking differently: Take 20 mg by mouth daily.  05/27/18  Yes Ladell Pier, MD  carvedilol (COREG) 3.125 MG tablet Take 1 tablet (3.125 mg total) by mouth 2 (two) times daily with a meal. 05/27/18  Yes Ladell Pier, MD  fluticasone (FLONASE) 50 MCG/ACT nasal spray Place 2 sprays into both nostrils daily. 05/27/18  Yes Ladell Pier, MD  gabapentin (NEURONTIN) 300 MG capsule Take 1 capsule (300 mg total) by mouth 2 (two) times daily. For  Foot pain/numbness Patient taking differently: Take 300 mg by mouth 2 (two) times daily as needed (for foot pain and numbness).  09/16/17  Yes Ladell Pier, MD  glucose blood (TRUE METRIX BLOOD GLUCOSE TEST) test strip Use as instructed 03/30/17  Yes Ladell Pier, MD  ibuprofen (ADVIL,MOTRIN) 200 MG tablet Take 400 mg by mouth every 6 (six) hours as needed for cramping.   Yes [provider]  insulin aspart (NOVOLOG) 100 UNIT/ML injection INJECT 0 TO 10 UNITS UNDER THE SKIN 3 TIMES A DAY WITH MEALS Patient taking differently: Inject 0-10 Units into the skin 3 (three) times daily with meals. Based on sugar levels. 02/14/17  Yes McClung, Angela M, PA-C  insulin NPH Human (HUMULIN N,NOVOLIN N) 100 UNIT/ML injection 41 units in a.m  subcut and 30 units in p.m Patient taking differently: Inject 30-41 Units into the skin See admin instructions. 41 units in a.m subcut and 30 units in p.m. 06/17/17  Yes Ladell Pier, MD  Blood Glucose Monitoring Suppl (TRUE METRIX METER) w/Device KIT 37 Units/day by Does not apply route 4 (four) times daily. 10/18/15   Brayton Caves, PA-C  glucose blood (TRUE METRIX BLOOD GLUCOSE TEST) test strip Use as instructed 10/18/15   Brayton Caves, PA-C  TRUEPLUS LANCETS 28G MISC 37 Units by Does not apply route 4 (four) times daily. 10/18/15   Brayton Caves, PA-C    Vitamin D, Ergocalciferol, (DRISDOL) 50000 units CAPS capsule Take 1 capsule (50,000 Units total) by mouth every 7 (seven) days. Patient not taking: Reported on 05/31/2018 02/20/17   Argentina Donovan, PA-C    Family History Family History  Problem Relation Age of Onset  . Cancer Maternal Grandfather        prostate  . Hypertension Mother   . Stroke Mother   . Cancer Other   . COPD Other   . Hyperlipidemia Other   . Hypertension Father     Social History Social History   Tobacco Use  . Smoking status: Never Smoker  . Smokeless tobacco: Never Used  Substance Use Topics  . Alcohol use: Yes    Comment: occasional   . Drug use: No     Allergies   Patient has no known allergies.   Review of Systems Review of Systems All other systems negative except as documented in the HPI. All pertinent positives and negatives as reviewed in the HPI.  Physical Exam Updated Vital Signs BP (!) 159/89   Pulse 79   Temp 97.9 F (36.6 C) (Oral)   Resp 16   Ht 5' 1" (1.549 m)   Wt 69.5 kg   LMP 05/29/2018 (Exact Date)   SpO2 100%   BMI 28.95 kg/m   Physical Exam  Constitutional: She is oriented to person, place, and time. She appears well-developed and well-nourished. No distress.  HENT:  Head: Normocephalic and atraumatic.  Mouth/Throat: Oropharynx is clear and moist.  Eyes: Pupils are equal, round, and reactive to light.  Neck: Normal range of motion. Neck supple.  Cardiovascular: Normal rate, regular rhythm and normal heart sounds. Exam reveals no gallop and no friction rub.  No murmur heard. Pulmonary/Chest: Effort normal and breath sounds normal. No respiratory distress. She has no wheezes.  Abdominal: Soft. Bowel sounds are normal. She exhibits no distension. There is no tenderness.  Neurological: She is alert and oriented to person, place, and time. She exhibits normal muscle tone. Coordination normal.  Skin: Skin is warm and dry. Capillary refill takes less than 2  seconds. No rash noted. No erythema.  Psychiatric: She has a normal mood and affect. Her behavior is normal.  Nursing note and vitals reviewed.    ED Treatments / Results  Labs (all labs ordered are listed, but only abnormal results are displayed) Labs Reviewed  COMPREHENSIVE METABOLIC PANEL - Abnormal; Notable for the following components:      Result Value   CO2 19 (*)    Glucose, Bld 131 (*)    BUN 41 (*)    Creatinine, Ser 3.18 (*)    GFR calc non Af Amer 18 (*)    GFR calc Af Amer 20 (*)    All other components within normal limits  CBC WITH DIFFERENTIAL/PLATELET - Abnormal; Notable for the following components:  WBC 3.6 (*)    Hemoglobin 11.0 (*)    All other components within normal limits  I-STAT CHEM 8, ED - Abnormal; Notable for the following components:   Chloride 115 (*)    BUN 41 (*)    Creatinine, Ser 3.70 (*)    Glucose, Bld 126 (*)    Calcium, Ion 1.14 (*)    TCO2 18 (*)    Hemoglobin 11.6 (*)    HCT 34.0 (*)    All other components within normal limits  I-STAT TROPONIN, ED  I-STAT BETA HCG BLOOD, ED ([facility], [facility], [facility] ONLY)    EKG EKG Interpretation  Date/Time:  Saturday May 31 2018 09:56:19 EDT Ventricular Rate:  78 PR Interval:    QRS Duration: 82 QT Interval:  375 QTC Calculation: 428 R Axis:   74 Text Interpretation:  Sinus rhythm No old tracing to compare Confirmed by Deno Etienne [phone]) on 05/31/2018 9:58:54 AM   Radiology No results found.  Procedures Procedures (including critical care time)  Medications Ordered in ED Medications - No data to display   Initial Impression / Assessment and Plan / ED Course  I have reviewed the triage vital signs and the nursing notes.  Pertinent labs & imaging results that were available during my care of the patient were reviewed by me and considered in my medical decision making (see chart for details).     Patient has a normal potassium noted here today.  I will give her information to case  management to see if they can assist with her getting in to see nephrology.  She is advised to return here as needed.  Final Clinical Impressions(s) / ED Diagnoses   Final diagnoses:  None    ED Discharge Orders    None       Dalia Heading, PA-C 05/31/18 [place], DO 05/31/18 1242

## 2018-06-10 ENCOUNTER — Encounter: Payer: Self-pay | Admitting: Internal Medicine

## 2018-06-26 MED FILL — AMLODIPINE BESYLATE 5 MG TA: 5 | 30 days supply | Qty: 30 | Fill #1

## 2018-09-01 ENCOUNTER — Ambulatory Visit: Payer: Self-pay | Admitting: Internal Medicine

## 2018-10-14 ENCOUNTER — Other Ambulatory Visit: Payer: Self-pay | Admitting: Physician Assistant

## 2018-10-14 DIAGNOSIS — E559 Vitamin D deficiency, unspecified: Secondary | ICD-10-CM

## 2018-10-14 NOTE — Telephone Encounter (Signed)
Refill request

## 2018-10-15 MED ORDER — VITAMIN D (ERGOCALCIFEROL) 1.25 MG (50000 UNIT) PO CAPS: 50000.0000 [IU] | ORAL_CAPSULE | ORAL | 0 refills | Status: DC

## 2018-10-16 NOTE — Telephone Encounter (Signed)
Yahir could you schedule pt an appointment  

## 2018-10-30 ENCOUNTER — Encounter: Payer: Self-pay | Admitting: Internal Medicine

## 2018-10-30 ENCOUNTER — Other Ambulatory Visit: Payer: Self-pay

## 2018-10-30 ENCOUNTER — Other Ambulatory Visit: Payer: Self-pay | Admitting: Internal Medicine

## 2018-10-30 DIAGNOSIS — E1042 Type 1 diabetes mellitus with diabetic polyneuropathy: Secondary | ICD-10-CM

## 2018-10-30 MED ORDER — GLUCOSE BLOOD VI STRP: ORAL_STRIP | 12 refills | Status: DC

## 2018-10-30 MED ORDER — GABAPENTIN 300 MG PO CAPS: 300.0000 mg | ORAL_CAPSULE | Freq: Two times a day (BID) | ORAL | 1 refills | Status: DC

## 2018-10-30 MED FILL — ?CARVEDILOL 3.125 MG TABLET: 3.125 | 30 days supply | Qty: 60 | Fill #1

## 2018-10-30 MED FILL — AMLODIPINE BESYLATE 5 MG TA: 5 | 30 days supply | Qty: 30 | Fill #1

## 2018-10-30 MED FILL — TRUE METRIX TEST STRIP: 25 days supply | Qty: 100 | Fill #0

## 2018-10-30 MED FILL — ?ATORVASTATIN 10 MG TABLET: 10 | 90 days supply | Qty: 90 | Fill #1

## 2018-10-31 MED FILL — GABAPENTIN 300 MG CAPSULE: 300 | 30 days supply | Qty: 60 | Fill #0

## 2018-11-03 ENCOUNTER — Encounter (HOSPITAL_COMMUNITY): Payer: Self-pay | Admitting: Emergency Medicine

## 2018-11-03 ENCOUNTER — Observation Stay (HOSPITAL_COMMUNITY)
Admission: EM | Admit: 2018-11-03 | Discharge: 2018-11-05 | Disposition: A | Discharge status: Home / Self Care | Payer: Self-pay | Attending: Internal Medicine | Admitting: Internal Medicine

## 2018-11-03 ENCOUNTER — Emergency Department (HOSPITAL_COMMUNITY): Payer: Self-pay

## 2018-11-03 DIAGNOSIS — I10 Essential (primary) hypertension: Secondary | ICD-10-CM | POA: Diagnosis present

## 2018-11-03 DIAGNOSIS — I129 Hypertensive chronic kidney disease with stage 1 through stage 4 chronic kidney disease, or unspecified chronic kidney disease: Secondary | ICD-10-CM | POA: Insufficient documentation

## 2018-11-03 DIAGNOSIS — R9431 Abnormal electrocardiogram [ECG] [EKG]: Secondary | ICD-10-CM | POA: Insufficient documentation

## 2018-11-03 DIAGNOSIS — E1065 Type 1 diabetes mellitus with hyperglycemia: Secondary | ICD-10-CM

## 2018-11-03 DIAGNOSIS — E1029 Type 1 diabetes mellitus with other diabetic kidney complication: Secondary | ICD-10-CM

## 2018-11-03 DIAGNOSIS — Z791 Long term (current) use of non-steroidal anti-inflammatories (NSAID): Secondary | ICD-10-CM | POA: Insufficient documentation

## 2018-11-03 DIAGNOSIS — Z79899 Other long term (current) drug therapy: Secondary | ICD-10-CM | POA: Insufficient documentation

## 2018-11-03 DIAGNOSIS — G43909 Migraine, unspecified, not intractable, without status migrainosus: Secondary | ICD-10-CM | POA: Insufficient documentation

## 2018-11-03 DIAGNOSIS — Z9114 Patient's other noncompliance with medication regimen: Secondary | ICD-10-CM | POA: Insufficient documentation

## 2018-11-03 DIAGNOSIS — E1022 Type 1 diabetes mellitus with diabetic chronic kidney disease: Secondary | ICD-10-CM | POA: Insufficient documentation

## 2018-11-03 DIAGNOSIS — N189 Chronic kidney disease, unspecified: Secondary | ICD-10-CM

## 2018-11-03 DIAGNOSIS — Z794 Long term (current) use of insulin: Secondary | ICD-10-CM | POA: Insufficient documentation

## 2018-11-03 DIAGNOSIS — R0789 Other chest pain: Secondary | ICD-10-CM | POA: Insufficient documentation

## 2018-11-03 DIAGNOSIS — R079 Chest pain, unspecified: Secondary | ICD-10-CM | POA: Diagnosis present

## 2018-11-03 DIAGNOSIS — E875 Hyperkalemia: Secondary | ICD-10-CM | POA: Diagnosis present

## 2018-11-03 DIAGNOSIS — D631 Anemia in chronic kidney disease: Secondary | ICD-10-CM | POA: Insufficient documentation

## 2018-11-03 DIAGNOSIS — D649 Anemia, unspecified: Secondary | ICD-10-CM | POA: Diagnosis present

## 2018-11-03 DIAGNOSIS — N179 Acute kidney failure, unspecified: Principal | ICD-10-CM | POA: Insufficient documentation

## 2018-11-03 DIAGNOSIS — Z8249 Family history of ischemic heart disease and other diseases of the circulatory system: Secondary | ICD-10-CM | POA: Insufficient documentation

## 2018-11-03 DIAGNOSIS — IMO0002 Reserved for concepts with insufficient information to code with codable children: Secondary | ICD-10-CM | POA: Diagnosis present

## 2018-11-03 DIAGNOSIS — Z7951 Long term (current) use of inhaled steroids: Secondary | ICD-10-CM | POA: Insufficient documentation

## 2018-11-03 DIAGNOSIS — E785 Hyperlipidemia, unspecified: Secondary | ICD-10-CM | POA: Diagnosis present

## 2018-11-03 DIAGNOSIS — N184 Chronic kidney disease, stage 4 (severe): Secondary | ICD-10-CM | POA: Insufficient documentation

## 2018-11-03 LAB — CBC
HEMATOCRIT: 31.4 % — AB (ref 36.0–46.0)
Hemoglobin: 9.4 g/dL — ABNORMAL LOW (ref 12.0–15.0)
MCH: 26 pg (ref 26.0–34.0)
MCHC: 29.9 g/dL — AB (ref 30.0–36.0)
MCV: 87 fL (ref 80.0–100.0)
NRBC: 0 % (ref 0.0–0.2)
PLATELETS: 224 10*3/uL (ref 150–400)
RBC: 3.61 MIL/uL — ABNORMAL LOW (ref 3.87–5.11)
RDW: 12.9 % (ref 11.5–15.5)
WBC: 5.2 10*3/uL (ref 4.0–10.5)

## 2018-11-03 LAB — I-STAT TROPONIN, ED: Troponin i, poc: 0 ng/mL (ref 0.00–0.08)

## 2018-11-03 LAB — BASIC METABOLIC PANEL
Anion gap: 10 (ref 5–15)
BUN: 38 mg/dL — AB (ref 6–20)
CALCIUM: 9.2 mg/dL (ref 8.9–10.3)
CO2: 19 mmol/L — AB (ref 22–32)
CREATININE: 4.53 mg/dL — AB (ref 0.44–1.00)
Chloride: 109 mmol/L (ref 98–111)
GFR calc non Af Amer: 12 mL/min — ABNORMAL LOW (ref >60)
GFR, EST AFRICAN AMERICAN: 13 mL/min — AB (ref >60)
Glucose, Bld: 115 mg/dL — ABNORMAL HIGH (ref 70–99)
Potassium: 5.2 mmol/L — ABNORMAL HIGH (ref 3.5–5.1)
Sodium: 138 mmol/L (ref 135–145)

## 2018-11-03 LAB — I-STAT BETA HCG BLOOD, ED (MC, WL, AP ONLY)

## 2018-11-03 MED ORDER — SODIUM CHLORIDE 0.9 % IV BOLUS (SEPSIS)
1000.0000 mL | Freq: Once | INTRAVENOUS | Status: AC
  Administered 2018-11-04: 1000 mL via INTRAVENOUS

## 2018-11-03 MED ORDER — SODIUM CHLORIDE 0.9% FLUSH: 3.0000 mL | Freq: Once | INTRAVENOUS | Status: DC

## 2018-11-03 MED ORDER — SODIUM CHLORIDE 0.9 % IV SOLN
INTRAVENOUS | Status: DC
  Administered 2018-11-04 – 2018-11-05 (×4): via INTRAVENOUS

## 2018-11-03 NOTE — ED Triage Notes (Signed)
Pt has CKD stage 4, went to clinic today for lab work, MD called her and told her to come straight here due to K+ increase from 4.4 to 5.8 and creatinine from 3.3 to 4.6 since labs in January. Pt reports some central CP but denies any other complaints. Pt is not a dialysis pt/

## 2018-11-03 NOTE — ED Provider Notes (Signed)
TIME SEEN: 11:25 PM  CHIEF COMPLAINT: Abnormal labs  HPI: Patient is a 38 year old female with history of type 1 diabetes, hypertension, hyperlipidemia, chronic kidney disease not yet on dialysis who presents to the emergency department for abnormal labs.  Patient states that she went for a routine visit to her nephrologist at [facility] today.  States she was called on her way home and was told to come straight to the emergency department for worsening creatinine and hyperkalemia with potassium of 5.8.  She endorses mild central chest pain that she describes as a dull, ache.  No shortness of breath, nausea, vomiting.  States she has been eating and drinking well.  No recent diarrhea.  No fever.  Still making urine.  ROS: See HPI Constitutional: no fever  Eyes: no drainage  ENT: no runny nose   Cardiovascular:  no chest pain  Resp: no SOB  GI: no vomiting GU: no dysuria Integumentary: no rash  Allergy: no hives  Musculoskeletal: no leg swelling  Neurological: no slurred speech ROS otherwise negative  PAST MEDICAL HISTORY/PAST SURGICAL HISTORY:  Past Medical History:  Diagnosis Date  . Abnormal Pap smear   . Allergy    seasonal allergies  . Anemia   . Chronic kidney disease   . Diabetes mellitus    diagnosed age 8  . Diabetes mellitus without complication ([place])   . Glaucoma   . Hyperlipidemia   . Hypertension   . Migraines   . Noncompliance   . Numbness in left leg     MEDICATIONS:  Prior to Admission medications   Medication Sig Start Date End Date Taking? Authorizing Provider  amLODipine (NORVASC) 5 MG tablet Take 1 tablet (5 mg total) by mouth daily. 05/27/18   Ladell Pier, MD  atorvastatin (LIPITOR) 10 MG tablet Take 1 tablet (10 mg total) by mouth daily. Patient taking differently: Take 20 mg by mouth daily.  05/27/18   Ladell Pier, MD  Blood Glucose Monitoring Suppl (TRUE METRIX METER) w/Device KIT 37 Units/day by Does not apply route 4 (four) times daily.  10/18/15   Brayton Caves, PA-C  carvedilol (COREG) 3.125 MG tablet Take 1 tablet (3.125 mg total) by mouth 2 (two) times daily with a meal. 05/27/18   Ladell Pier, MD  fluticasone (FLONASE) 50 MCG/ACT nasal spray Place 2 sprays into both nostrils daily. 05/27/18   Ladell Pier, MD  gabapentin (NEURONTIN) 300 MG capsule Take 1 capsule (300 mg total) by mouth 2 (two) times daily. 10/30/18   Ladell Pier, MD  glucose blood (TRUE METRIX BLOOD GLUCOSE TEST) test strip Use as instructed 03/30/17   Ladell Pier, MD  glucose blood (TRUE METRIX BLOOD GLUCOSE TEST) test strip Use as instructed 10/30/18   Ladell Pier, MD  ibuprofen (ADVIL,MOTRIN) 200 MG tablet Take 400 mg by mouth every 6 (six) hours as needed for cramping.    [provider]  insulin aspart (NOVOLOG) 100 UNIT/ML injection INJECT 0 TO 10 UNITS UNDER THE SKIN 3 TIMES A DAY WITH MEALS Patient taking differently: Inject 0-10 Units into the skin 3 (three) times daily with meals. Based on sugar levels. 02/14/17   Argentina Donovan, PA-C  insulin NPH Human (HUMULIN N,NOVOLIN N) 100 UNIT/ML injection 41 units in a.m subcut and 30 units in p.m Patient taking differently: Inject 30-41 Units into the skin See admin instructions. 41 units in a.m subcut and 30 units in p.m. 06/17/17   Ladell Pier, MD  TRUEPLUS LANCETS 28G MISC 37 Units by Does not apply route 4 (four) times daily. 10/18/15   Brayton Caves, PA-C  Vitamin D, Ergocalciferol, (DRISDOL) 1.25 MG (50000 UT) CAPS capsule Take 1 capsule (50,000 Units total) by mouth every 7 (seven) days. 10/15/18   Ladell Pier, MD    ALLERGIES:  No Known Allergies  SOCIAL HISTORY:  Social History   Tobacco Use  . Smoking status: Never Smoker  . Smokeless tobacco: Never Used  Substance Use Topics  . Alcohol use: Yes    Comment: occasional     FAMILY HISTORY: Family History  Problem Relation Age of Onset  . Cancer Maternal Grandfather        prostate   . Hypertension Mother   . Stroke Mother   . Cancer Other   . COPD Other   . Hyperlipidemia Other   . Hypertension Father     EXAM: BP (!) 165/86 (BP Location: Left Arm)   Pulse 74   Temp 97.9 F (36.6 C) (Oral)   Resp 14   LMP 10/30/2018   SpO2 100%  CONSTITUTIONAL: Alert and oriented and responds appropriately to questions. Well-appearing; well-nourished HEAD: Normocephalic EYES: Conjunctivae clear, pupils appear equal, EOMI ENT: normal nose; moist mucous membranes NECK: Supple, no meningismus, no nuchal rigidity, no LAD  CARD: RRR; S1 and S2 appreciated; no murmurs, no clicks, no rubs, no gallops RESP: Normal chest excursion without splinting or tachypnea; breath sounds clear and equal bilaterally; no wheezes, no rhonchi, no rales, no hypoxia or respiratory distress, speaking full sentences ABD/GI: Normal bowel sounds; non-distended; soft, non-tender, no rebound, no guarding, no peritoneal signs, no hepatosplenomegaly BACK:  The back appears normal and is non-tender to palpation, there is no CVA tenderness EXT: Normal ROM in all joints; non-tender to palpation; no edema; normal capillary refill; no cyanosis, no calf tenderness or swelling    SKIN: Normal color for age and race; warm; no rash NEURO: Moves all extremities equally PSYCH: The patient's mood and manner are appropriate. Grooming and personal hygiene are appropriate.  MEDICAL DECISION MAKING: Patient here with acute on chronic renal failure, hyperkalemia.  Repeat labs show creatinine of 4.5.  Creatinine normally runs between 3.1 and 3.7.  Her potassium is 5.2 without EKG changes.  Will give IV fluids.  Suspect this is just progressive worsening of her chronic disease secondary to diabetes and hypertension.  Discussed with patient that she would likely need to be put on dialysis soon but not emergently as she does not appear volume overloaded and is still urinating.  Will discuss with medicine for admission.  PCP is with  [facility].  ED PROGRESS:   12:07 AM Discussed patient's case with hospitalist, Dr. Blaine Hamper.  I have recommended admission and patient (and family if present) agree with this plan. Admitting physician will place admission orders.   I reviewed all nursing notes, vitals, pertinent previous records, EKGs, lab and urine results, imaging (as available).     EKG Interpretation  Date/Time:  Monday November 03 2018 18:37:32 EST Ventricular Rate:  80 PR Interval:  130 QRS Duration: 88 QT Interval:  366 QTC Calculation: 422 R Axis:   81 Text Interpretation:  Normal sinus rhythm Normal ECG No significant change since last tracing Confirmed by Darnelle Derrick, Cyril Mourning [phone]) on 11/03/2018 11:21:31 PM         Jovonna Nickell, Delice Bison, DO 11/04/18 [id number]

## 2018-11-04 ENCOUNTER — Other Ambulatory Visit: Payer: Self-pay

## 2018-11-04 ENCOUNTER — Encounter (HOSPITAL_COMMUNITY): Payer: Self-pay | Admitting: General Practice

## 2018-11-04 DIAGNOSIS — N179 Acute kidney failure, unspecified: Secondary | ICD-10-CM

## 2018-11-04 DIAGNOSIS — I10 Essential (primary) hypertension: Secondary | ICD-10-CM

## 2018-11-04 DIAGNOSIS — E785 Hyperlipidemia, unspecified: Secondary | ICD-10-CM

## 2018-11-04 DIAGNOSIS — N184 Chronic kidney disease, stage 4 (severe): Secondary | ICD-10-CM

## 2018-11-04 DIAGNOSIS — R079 Chest pain, unspecified: Secondary | ICD-10-CM

## 2018-11-04 DIAGNOSIS — D649 Anemia, unspecified: Secondary | ICD-10-CM

## 2018-11-04 DIAGNOSIS — E875 Hyperkalemia: Secondary | ICD-10-CM | POA: Diagnosis present

## 2018-11-04 DIAGNOSIS — E1029 Type 1 diabetes mellitus with other diabetic kidney complication: Secondary | ICD-10-CM

## 2018-11-04 DIAGNOSIS — E1065 Type 1 diabetes mellitus with hyperglycemia: Secondary | ICD-10-CM

## 2018-11-04 LAB — LIPID PANEL
CHOL/HDL RATIO: 4.6 ratio
Cholesterol: 221 mg/dL — ABNORMAL HIGH (ref 0–200)
HDL: 48 mg/dL (ref >40)
LDL Cholesterol: 153 mg/dL — ABNORMAL HIGH (ref 0–99)
TRIGLYCERIDES: 101 mg/dL (ref <150)
VLDL: 20 mg/dL (ref 0–40)

## 2018-11-04 LAB — GLUCOSE, CAPILLARY
GLUCOSE-CAPILLARY: 89 mg/dL (ref 70–99)
Glucose-Capillary: 85 mg/dL (ref 70–99)
Glucose-Capillary: 82 mg/dL (ref 70–99)
Glucose-Capillary: 87 mg/dL (ref 70–99)
Glucose-Capillary: 143 mg/dL — ABNORMAL HIGH (ref 70–99)
Glucose-Capillary: 90 mg/dL (ref 70–99)

## 2018-11-04 LAB — BASIC METABOLIC PANEL
Anion gap: 9 (ref 5–15)
BUN: 32 mg/dL — ABNORMAL HIGH (ref 6–20)
CHLORIDE: 114 mmol/L — AB (ref 98–111)
CO2: 18 mmol/L — ABNORMAL LOW (ref 22–32)
Calcium: 8.7 mg/dL — ABNORMAL LOW (ref 8.9–10.3)
Creatinine, Ser: 3.92 mg/dL — ABNORMAL HIGH (ref 0.44–1.00)
GFR calc Af Amer: 16 mL/min — ABNORMAL LOW (ref >60)
GFR calc non Af Amer: 14 mL/min — ABNORMAL LOW (ref >60)
Glucose, Bld: 95 mg/dL (ref 70–99)
Potassium: 4.3 mmol/L (ref 3.5–5.1)
Sodium: 141 mmol/L (ref 135–145)

## 2018-11-04 LAB — HEMOGLOBIN A1C
Hgb A1c MFr Bld: 6.3 % — ABNORMAL HIGH (ref 4.8–5.6)
Mean Plasma Glucose: 134.11 mg/dL

## 2018-11-04 LAB — CBC
HCT: 29.1 % — ABNORMAL LOW (ref 36.0–46.0)
Hemoglobin: 9.2 g/dL — ABNORMAL LOW (ref 12.0–15.0)
MCH: 27.1 pg (ref 26.0–34.0)
MCHC: 31.6 g/dL (ref 30.0–36.0)
MCV: 85.8 fL (ref 80.0–100.0)
Platelets: 201 10*3/uL (ref 150–400)
RBC: 3.39 MIL/uL — AB (ref 3.87–5.11)
RDW: 13 % (ref 11.5–15.5)
WBC: 4.7 10*3/uL (ref 4.0–10.5)
nRBC: 0 % (ref 0.0–0.2)

## 2018-11-04 LAB — SODIUM, URINE, RANDOM: Sodium, Ur: 82 mmol/L

## 2018-11-04 LAB — RAPID URINE DRUG SCREEN, HOSP PERFORMED
Amphetamines: NOT DETECTED
Barbiturates: NOT DETECTED
Benzodiazepines: NOT DETECTED
COCAINE: NOT DETECTED
Opiates: NOT DETECTED
Tetrahydrocannabinol: NOT DETECTED

## 2018-11-04 LAB — TROPONIN I
Troponin I: <0.03 ng/mL (ref <0.03)
Troponin I: <0.03 ng/mL (ref <0.03)
Troponin I: <0.03 ng/mL (ref <0.03)

## 2018-11-04 LAB — HIV ANTIBODY (ROUTINE TESTING W REFLEX): HIV Screen 4th Generation wRfx: NONREACTIVE

## 2018-11-04 LAB — CREATININE, URINE, RANDOM: Creatinine, Urine: 84.1 mg/dL

## 2018-11-04 MED ORDER — INSULIN NPH (HUMAN) (ISOPHANE) 100 UNIT/ML ~~LOC~~ SUSP
20.0000 [IU] | Freq: Every day | SUBCUTANEOUS | Status: DC
  Administered 2018-11-04 – 2018-11-05 (×2): 20 [IU] via SUBCUTANEOUS
  Filled 2018-11-04: qty 10

## 2018-11-04 MED ORDER — ACETAMINOPHEN 325 MG PO TABS: 650.0000 mg | ORAL_TABLET | Freq: Four times a day (QID) | ORAL | Status: DC | PRN

## 2018-11-04 MED ORDER — ATORVASTATIN CALCIUM 10 MG PO TABS
20.0000 mg | ORAL_TABLET | Freq: Every day | ORAL | Status: DC
  Filled 2018-11-04: qty 2

## 2018-11-04 MED ORDER — ATORVASTATIN CALCIUM 40 MG PO TABS
40.0000 mg | ORAL_TABLET | Freq: Every day | ORAL | Status: DC
  Administered 2018-11-05: 40 mg via ORAL
  Filled 2018-11-04: qty 1

## 2018-11-04 MED ORDER — INSULIN NPH (HUMAN) (ISOPHANE) 100 UNIT/ML ~~LOC~~ SUSP: 15.0000 [IU] | Freq: Every day | SUBCUTANEOUS | Status: DC

## 2018-11-04 MED ORDER — ONDANSETRON HCL 4 MG PO TABS: 4.0000 mg | ORAL_TABLET | Freq: Four times a day (QID) | ORAL | Status: DC | PRN

## 2018-11-04 MED ORDER — HYDRALAZINE HCL 20 MG/ML IJ SOLN: 5.0000 mg | INTRAMUSCULAR | Status: DC | PRN

## 2018-11-04 MED ORDER — MORPHINE SULFATE (PF) 2 MG/ML IV SOLN: 2.0000 mg | INTRAVENOUS | Status: DC | PRN

## 2018-11-04 MED ORDER — SENNOSIDES-DOCUSATE SODIUM 8.6-50 MG PO TABS: 1.0000 | ORAL_TABLET | Freq: Every evening | ORAL | Status: DC | PRN

## 2018-11-04 MED ORDER — ASPIRIN EC 81 MG PO TBEC
81.0000 mg | DELAYED_RELEASE_TABLET | Freq: Every day | ORAL | Status: DC
  Administered 2018-11-04 – 2018-11-05 (×2): 81 mg via ORAL
  Filled 2018-11-04 (×2): qty 1

## 2018-11-04 MED ORDER — ZOLPIDEM TARTRATE 5 MG PO TABS: 5.0000 mg | ORAL_TABLET | Freq: Every evening | ORAL | Status: DC | PRN

## 2018-11-04 MED ORDER — NITROGLYCERIN 0.4 MG SL SUBL: 0.4000 mg | SUBLINGUAL_TABLET | SUBLINGUAL | Status: DC | PRN

## 2018-11-04 MED ORDER — ACETAMINOPHEN 650 MG RE SUPP: 650.0000 mg | Freq: Four times a day (QID) | RECTAL | Status: DC | PRN

## 2018-11-04 MED ORDER — AMLODIPINE BESYLATE 2.5 MG PO TABS
5.0000 mg | ORAL_TABLET | Freq: Every day | ORAL | Status: DC
  Administered 2018-11-04 – 2018-11-05 (×2): 5 mg via ORAL
  Filled 2018-11-04 (×3): qty 2

## 2018-11-04 MED ORDER — SODIUM POLYSTYRENE SULFONATE 15 GM/60ML PO SUSP
15.0000 g | Freq: Once | ORAL | Status: AC
  Administered 2018-11-04: 15 g via ORAL
  Filled 2018-11-04: qty 60

## 2018-11-04 MED ORDER — CARVEDILOL 3.125 MG PO TABS
3.1250 mg | ORAL_TABLET | Freq: Two times a day (BID) | ORAL | Status: DC
  Administered 2018-11-04 – 2018-11-05 (×4): 3.125 mg via ORAL
  Filled 2018-11-04 (×6): qty 1

## 2018-11-04 MED ORDER — INSULIN ASPART 100 UNIT/ML ~~LOC~~ SOLN
0.0000 [IU] | Freq: Three times a day (TID) | SUBCUTANEOUS | Status: DC
  Administered 2018-11-04: 1 [IU] via SUBCUTANEOUS

## 2018-11-04 MED ORDER — ONDANSETRON HCL 4 MG/2ML IJ SOLN: 4.0000 mg | Freq: Four times a day (QID) | INTRAMUSCULAR | Status: DC | PRN

## 2018-11-04 MED ORDER — INSULIN NPH (HUMAN) (ISOPHANE) 100 UNIT/ML ~~LOC~~ SUSP: 15.0000 [IU] | Freq: Two times a day (BID) | SUBCUTANEOUS | Status: DC

## 2018-11-04 NOTE — Progress Notes (Signed)
   11/04/18 0303  Vitals  Temp 98.3 F (36.8 C)  Temp Source Oral  BP (!) 173/98  MAP (mmHg) 118  BP Location Left Arm  BP Method Automatic  Patient Position (if appropriate) Lying  Pulse Rate 80  Pulse Rate Source Monitor  Resp 20  Oxygen Therapy  SpO2 100 %  O2 Device Room Air  MEWS Score  MEWS RR 0  MEWS Pulse 0  MEWS Systolic 0  MEWS LOC 0  MEWS Temp 0  MEWS Score 0  MEWS Score Color Green  Admitted pt to rm 3E22 from ED, pt alert and oriented, denied pain, oriented to room, call bell placed within reach, placed on cardiac monitor, CCMD made aware.

## 2018-11-04 NOTE — ED Notes (Signed)
Attempted to call report

## 2018-11-04 NOTE — Progress Notes (Signed)
  PROGRESS NOTE  Patient admitted earlier this morning. See H&P.  Patient admitted with renal failure, hyperkalemia.  Also had complaints of mild chest pain on admission.  Creatinine improving, continue IV fluid and monitor BMP Hyperkalemia resolved Chest pain resolved this morning, troponin remains negative x3 Blood pressure improved 139/86 this morning, continue home regimen amlodipine, Coreg Increase Lipitor dose for hyperlipidemia   Dessa Phi, DO [facility] [email or web address] 11/04/2018, 10:41 AM

## 2018-11-04 NOTE — H&P (Signed)
History and Physical    Cathy Green IRJ:[id number] DOB: [birth year] DOA: 11/03/2018  Referring MD/NP/PA:   PCP: Ladell Pier, MD   Patient coming from:  The patient is coming from home.  At baseline, pt is independent for most of ADL.        Chief Complaint: Abnormal lab and chest pain  HPI: Cathy Green is a 38 y.o. female with medical history significant of CKD-IV, hypertension, hyperlipidemia, type 1 diabetes, migraine headache, anemia, medication noncompliance, who presents with abnormal lab.  Patient states that she went for a routine visit to her nephrologist at [facility] today. She was found to have worsening renal function and hyperkalemia with potassium 5.8. States she was called on her way home and was told to come straight to the emergency department.  Patient states that she has some mild chest pain, which is located in the substernal area, intermittent, dull, 4 out of 10 in severity, nonradiating.  Denies any shortness of breath, cough, fever or chills.  No nausea, vomiting, diarrhea, abdominal pain, symptoms of UTI or unilateral weakness.  Patient states that she is on menstrual cycle, but no heavy vaginal bleeding.  ED Course: pt was found to have worsening renal function with creatinine creatinine increased from recent baseline 2.5-3.6 to 4.53, BUN 38, potassium 5.2, WBC 5.2, negative troponin, negative pregnancy test, temperature normal, elevated blood pressure 170/92, heart rate 70s, oxygen saturation 100% on room air, chest x-ray negative.  Patient is placed on telemetry bed for observation.  Review of Systems:   General: no fevers, chills, no body weight gain, has fatigue HEENT: no blurry vision, hearing changes or sore throat Respiratory: no dyspnea, coughing, wheezing CV: has chest pain, no palpitations GI: no nausea, vomiting, abdominal pain, diarrhea, constipation GU: no dysuria, burning on urination, increased urinary frequency, hematuria  Ext: no leg  edema Neuro: no unilateral weakness, numbness, or tingling, no vision change or hearing loss Skin: no rash, no skin tear. MSK: No muscle spasm, no deformity, no limitation of range of movement in spin Heme: No easy bruising.  Travel history: No recent long distant travel.  Allergy:  Allergies  Allergen Reactions  . Garlic Nausea And Vomiting    Past Medical History:  Diagnosis Date  . Abnormal Pap smear   . Allergy    seasonal allergies  . Anemia   . Chronic kidney disease   . Diabetes mellitus    diagnosed age 38  . Diabetes mellitus without complication ([place])   . Glaucoma   . Hyperlipidemia   . Hypertension   . Migraines   . Noncompliance   . Numbness in left leg     Past Surgical History:  Procedure Laterality Date  . WISDOM TOOTH EXTRACTION      Social History:  reports that she has never smoked. She has never used smokeless tobacco. She reports current alcohol use. She reports that she does not use drugs.  Family History:  Family History  Problem Relation Age of Onset  . Cancer Maternal Grandfather        prostate  . Hypertension Mother   . Stroke Mother   . Cancer Other   . COPD Other   . Hyperlipidemia Other   . Hypertension Father      Prior to Admission medications   Medication Sig Start Date End Date Taking? Authorizing Provider  amLODipine (NORVASC) 5 MG tablet Take 1 tablet (5 mg total) by mouth daily. 05/27/18   Ladell Pier, MD  atorvastatin (  LIPITOR) 10 MG tablet Take 1 tablet (10 mg total) by mouth daily. Patient taking differently: Take 20 mg by mouth daily.  05/27/18   Ladell Pier, MD  Blood Glucose Monitoring Suppl (TRUE METRIX METER) w/Device KIT 37 Units/day by Does not apply route 4 (four) times daily. 10/18/15   Brayton Caves, PA-C  carvedilol (COREG) 3.125 MG tablet Take 1 tablet (3.125 mg total) by mouth 2 (two) times daily with a meal. 05/27/18   Ladell Pier, MD  fluticasone (FLONASE) 50 MCG/ACT nasal spray Place 2  sprays into both nostrils daily. 05/27/18   Ladell Pier, MD  gabapentin (NEURONTIN) 300 MG capsule Take 1 capsule (300 mg total) by mouth 2 (two) times daily. 10/30/18   Ladell Pier, MD  glucose blood (TRUE METRIX BLOOD GLUCOSE TEST) test strip Use as instructed 03/30/17   Ladell Pier, MD  glucose blood (TRUE METRIX BLOOD GLUCOSE TEST) test strip Use as instructed 10/30/18   Ladell Pier, MD  ibuprofen (ADVIL,MOTRIN) 200 MG tablet Take 400 mg by mouth every 6 (six) hours as needed for cramping.    [provider]  insulin aspart (NOVOLOG) 100 UNIT/ML injection INJECT 0 TO 10 UNITS UNDER THE SKIN 3 TIMES A DAY WITH MEALS Patient taking differently: Inject 0-10 Units into the skin 3 (three) times daily with meals. Based on sugar levels. 02/14/17   Argentina Donovan, PA-C  insulin NPH Human (HUMULIN N,NOVOLIN N) 100 UNIT/ML injection 41 units in a.m subcut and 30 units in p.m Patient taking differently: Inject 30-41 Units into the skin See admin instructions. 41 units in a.m subcut and 30 units in p.m. 06/17/17   Ladell Pier, MD  TRUEPLUS LANCETS 28G MISC 37 Units by Does not apply route 4 (four) times daily. 10/18/15   Brayton Caves, PA-C  Vitamin D, Ergocalciferol, (DRISDOL) 1.25 MG (50000 UT) CAPS capsule Take 1 capsule (50,000 Units total) by mouth every 7 (seven) days. 10/15/18   Ladell Pier, MD    Physical Exam: Vitals:   11/04/18 0145 11/04/18 0200 11/04/18 0215 11/04/18 0303  BP: (!) 151/94 (!) 152/92 (!) 159/95 (!) 173/98  Pulse: 82 80 78 80  Resp: '19 15  20  ' Temp:    98.3 F (36.8 C)  TempSrc:    Oral  SpO2: 100% 100% 100% 100%   General: Not in acute distress HEENT:       Eyes: PERRL, EOMI, no scleral icterus.       ENT: No discharge from the ears and nose, no pharynx injection, no tonsillar enlargement.        Neck: No JVD, no bruit, no mass felt. Heme: No neck lymph node enlargement. Cardiac: S1/S2, RRR, No murmurs, No gallops or  rubs. Respiratory: No rales, wheezing, rhonchi or rubs. GI: Soft, nondistended, nontender, no rebound pain, no organomegaly, BS present. GU: No hematuria Ext: No pitting leg edema bilaterally. 2+DP/PT pulse bilaterally. Musculoskeletal: No joint deformities, No joint redness or warmth, no limitation of ROM in spin. Skin: No rashes.  Neuro: Alert, oriented X3, cranial nerves II-XII grossly intact, moves all extremities normally.  Psych: Patient is not psychotic, no suicidal or hemocidal ideation.  Labs on Admission: I have personally reviewed following labs and imaging studies  CBC: Recent Labs  Lab 11/03/18 1832  WBC 5.2  HGB 9.4*  HCT 31.4*  MCV 87.0  PLT 883   Basic Metabolic Panel: Recent Labs  Lab 11/03/18 1832  NA 138  K 5.2*  CL 109  CO2 19*  GLUCOSE 115*  BUN 38*  CREATININE 4.53*  CALCIUM 9.2   GFR: CrCl cannot be calculated (Unknown ideal weight.). Liver Function Tests: No results for input(s): AST, ALT, ALKPHOS, BILITOT, PROT, ALBUMIN in the last 168 hours. No results for input(s): LIPASE, AMYLASE in the last 168 hours. No results for input(s): AMMONIA in the last 168 hours. Coagulation Profile: No results for input(s): INR, PROTIME in the last 168 hours. Cardiac Enzymes: Recent Labs  Lab 11/04/18 0107  TROPONINI <0.03   BNP (last 3 results) No results for input(s): PROBNP in the last 8760 hours. HbA1C: No results for input(s): HGBA1C in the last 72 hours. CBG: No results for input(s): GLUCAP in the last 168 hours. Lipid Profile: No results for input(s): CHOL, HDL, LDLCALC, TRIG, CHOLHDL, LDLDIRECT in the last 72 hours. Thyroid Function Tests: No results for input(s): TSH, T4TOTAL, FREET4, T3FREE, THYROIDAB in the last 72 hours. Anemia Panel: No results for input(s): VITAMINB12, FOLATE, FERRITIN, TIBC, IRON, RETICCTPCT in the last 72 hours. Urine analysis:    Component Value Date/Time   COLORURINE YELLOW 10/19/2015 1552   APPEARANCEUR CLEAR  10/19/2015 1552   LABSPEC 1.023 10/19/2015 1552   LABSPEC 1.025 09/07/2011 1500   PHURINE 6.0 10/19/2015 1552   GLUCOSEU >1000 (A) 10/19/2015 1552   HGBUR MODERATE (A) 10/19/2015 1552   BILIRUBINUR NEGATIVE 10/19/2015 1552   BILIRUBINUR neg 10/18/2015 1421   BILIRUBINUR Negative 09/07/2011 1500   KETONESUR 15 (A) 10/19/2015 1552   PROTEINUR >300 (A) 10/19/2015 1552   UROBILINOGEN 0.2 10/18/2015 1421   NITRITE NEGATIVE 10/19/2015 1552   LEUKOCYTESUR NEGATIVE 10/19/2015 1552   LEUKOCYTESUR Negative 09/07/2011 1500   Sepsis Labs: '@LABRCNTIP' (procalcitonin:4,lacticidven:4) )No results found for this or any previous visit (from the past 240 hour(s)).   Radiological Exams on Admission: Dg Chest 2 View  Result Date: 11/03/2018 CLINICAL DATA:  Central chest pain for 1 day. Hypertension and diabetes. Nonsmoker. EXAM: CHEST - 2 VIEW COMPARISON:  None. FINDINGS: The heart size and mediastinal contours are within normal limits. Both lungs are clear. The visualized skeletal structures are unremarkable. IMPRESSION: No active cardiopulmonary disease. Electronically Signed   By: Lucienne Capers M.D.   On: 11/03/2018 19:05     EKG: Independently reviewed.  Sinus rhythm, QTC 422, no ischemic change.  Assessment/Plan Principal Problem:   Acute renal failure superimposed on stage 4 chronic kidney disease (HCC) Active Problems:   Normocytic anemia   DM (diabetes mellitus), type 1, uncontrolled, with renal complications (HCC)   Hyperlipidemia   Hypertension   Chest pain   Hyperkalemia   Acute renal failure superimposed on stage 4 chronic kidney disease ([place]):  Baseline Cre is 2.5-3.6 recently. pt's Cre is 4.53 and BUN 38 on admission. Likely due to progression of CKD-IV due to poorly controlled hypertension secondary to medication noncompliance. Pt has ibuprofen on her medication list, but she states that she is not taking this medication currently. Pt had renal US recently on 10/15/18, which showed  no evidence of hydronephrosis and unremarkable appearance of the kidneys. Will not repeat renal US at this moment, but if kidney function does not improve, may consider to repeat renal ultrasound.  - will place on tele bed for obs - IVF: 1L NS, then 125 cc/h - strict in and out - Follow up renal function by BMP - Check FeNa   Normocytic anemia in CKD-IV: Hgb 11.0 on 05/31/18-->9.4. -anemia panel  DM (diabetes mellitus), type 1, uncontrolled,  with renal complications ([place]): Last A1c 6.5 on 05/27/18, well controled recently. Patient is taking NPH insulin at home -will decrease NPH insulin dose from 41-30 units twice daily to 20-15 unit twice daily -SSI  Hyperlipidemia: -lipitor   HTN: Blood pressure is elevated at 170/92.  Patient is not taking blood pressure medications at home. -Resume home medications medications: Amlodipine and Coreg -IV hydralazine prn  Hyperkalemia: K=5.2 -Kayexalate 15 g -IV fluid as above  Chest pain: Patient has a mild central chest pain,, likely due to demand ischemia secondary to elevated blood pressure and worsening renal function. - cycle CE q6 x3 and repeat EKG in the am  - prn Nitroglycerin, Morphine, and aspirin, lipitor  - Risk factor stratification: will check FLP and A1C, UDS - did not order 2d echo--> please reevaluate pt in AM to decide if pt needs 2D echo.      DVT ppx: SCD Code Status: Full code Family Communication: None at bed side.     Disposition Plan:  Anticipate discharge back to previous home environment Consults called:  none Admission status: Obs / tele     Date of Service 11/04/2018    [facility]   If 7PM-7AM, please contact night-coverage [email or web address] Password [email or web address] 11/04/2018, 3:22 AM

## 2018-11-05 LAB — BASIC METABOLIC PANEL
Anion gap: 4 — ABNORMAL LOW (ref 5–15)
BUN: 28 mg/dL — ABNORMAL HIGH (ref 6–20)
CO2: 22 mmol/L (ref 22–32)
Calcium: 8.4 mg/dL — ABNORMAL LOW (ref 8.9–10.3)
Chloride: 116 mmol/L — ABNORMAL HIGH (ref 98–111)
Creatinine, Ser: 3.26 mg/dL — ABNORMAL HIGH (ref 0.44–1.00)
GFR calc Af Amer: 20 mL/min — ABNORMAL LOW (ref >60)
GFR calc non Af Amer: 17 mL/min — ABNORMAL LOW (ref >60)
Glucose, Bld: 95 mg/dL (ref 70–99)
Potassium: 4.2 mmol/L (ref 3.5–5.1)
Sodium: 142 mmol/L (ref 135–145)

## 2018-11-05 LAB — IRON AND TIBC
Iron: 21 ug/dL — ABNORMAL LOW (ref 28–170)
Saturation Ratios: 9 % — ABNORMAL LOW (ref 10.4–31.8)
TIBC: 223 ug/dL — ABNORMAL LOW (ref 250–450)
UIBC: 202 ug/dL

## 2018-11-05 LAB — CBC
HCT: 26.3 % — ABNORMAL LOW (ref 36.0–46.0)
Hemoglobin: 8.3 g/dL — ABNORMAL LOW (ref 12.0–15.0)
MCH: 27.6 pg (ref 26.0–34.0)
MCHC: 31.6 g/dL (ref 30.0–36.0)
MCV: 87.4 fL (ref 80.0–100.0)
Platelets: 187 10*3/uL (ref 150–400)
RBC: 3.01 MIL/uL — ABNORMAL LOW (ref 3.87–5.11)
RDW: 13.1 % (ref 11.5–15.5)
WBC: 4.4 10*3/uL (ref 4.0–10.5)
nRBC: 0 % (ref 0.0–0.2)

## 2018-11-05 LAB — RETICULOCYTES
IMMATURE RETIC FRACT: 3.5 % (ref 2.3–15.9)
RBC.: 3.01 MIL/uL — ABNORMAL LOW (ref 3.87–5.11)
Retic Count, Absolute: 21.7 10*3/uL (ref 19.0–186.0)
Retic Ct Pct: 0.7 % (ref 0.4–3.1)

## 2018-11-05 LAB — GLUCOSE, CAPILLARY: Glucose-Capillary: 96 mg/dL (ref 70–99)

## 2018-11-05 LAB — FERRITIN: Ferritin: 54 ng/mL (ref 11–307)

## 2018-11-05 LAB — FOLATE: Folate: 6.8 ng/mL (ref >5.9)

## 2018-11-05 LAB — VITAMIN B12: Vitamin B-12: 320 pg/mL (ref 180–914)

## 2018-11-05 NOTE — Discharge Summary (Signed)
Physician Discharge Summary  Cathy Green PFX:[id number] DOB: 09-Jul-1981 DOA: 11/03/2018  PCP: Ladell Pier, MD  Admit date: 11/03/2018 Discharge date: 11/05/2018  Admitted From: home Disposition:  home   Recommendations for Outpatient Follow-up:  1. Repeat Bmet in 1 wk  Discharge Condition:  stable   CODE STATUS:  Full code   Consultations:  none    Discharge Diagnoses:  Principal Problem:   Acute renal failure superimposed on stage 4 chronic kidney disease (HCC) Active Problems:   Chest pain   Hyperkalemia   Normocytic anemia   DM (diabetes mellitus), type 1, uncontrolled, with renal complications ([place])   Hyperlipidemia   Hypertension       HPI: Cathy Green is a 38 y.o. female with medical history significant of CKD-IV, hypertension, hyperlipidemia, type 1 diabetes, migraine headache, anemia, medication noncompliance, who presents with abnormal lab.  Patient states that she went for a routine visit to her nephrologist at [facility] today. She was found to have worsening renal function, Cr 4.53 and hyperkalemia with potassium 5.8. States she was called on her way home and was told to come straight to the emergency department.  Patient states that she has some mild chest pain, which is located in the substernal area, intermittent, dull, 4 out of 10 in severity, nonradiating.  Denies any shortness of breath, cough, fever or chills.  No nausea, vomiting, diarrhea, abdominal pain, symptoms of UTI or unilateral weakness.   Hospital Course:  AKI/ hyperkalemia- CKD 4 - treated with IVF and Kayexalate with improvement - last Cr in Aug, 2019 was 3.70- admitted with Cr of 4.53 which has improved with IVF to 3.26 - advised to avoid NSAIDs  Chest pain - central chest pain, dull ache without and associated symptoms  - likely non cardiac, no h/o GERD, c/o heartburn or anxiety either - Troponin < 0.03 x 3  DM- insulin requiring - well controlled with A1c 6.5 on 05/27/18 - cont  home meds  Normocytic anemia - anemia panel:  Ref. Range 11/05/2018 05:10  Iron Latest Ref Range: 28 - 170 ug/dL 21 (L)  UIBC Latest Units: ug/dL 202  TIBC Latest Ref Range: 250 - 450 ug/dL 223 (L)  Saturation Ratios Latest Ref Range: 10.4 - 31.8 % 9 (L)  Ferritin Latest Ref Range: 11 - 307 ng/mL 54  Folate Latest Ref Range: >5.9 ng/mL 6.8    Discharge Exam: Vitals:   11/05/18 0028 11/05/18 0520  BP: 140/86 (!) 149/91  Pulse: 83 79  Resp: 18 16  Temp: 98.8 F (37.1 C) 98.4 F (36.9 C)  SpO2: 99% 100%   Vitals:   11/04/18 1719 11/04/18 2037 11/05/18 0028 11/05/18 0520  BP: (!) 146/86 140/89 140/86 (!) 149/91  Pulse: 83 80 83 79  Resp: _0 Temp: 98.8 F (37.1 C) 99 F (37.2 C) 98.8 F (37.1 C) 98.4 F (36.9 C)  TempSrc: Oral Oral Oral Oral  SpO2: 99% 100% 99% 100%  Weight:      Height:        General: Pt is alert, awake, not in acute distress Cardiovascular: RRR, S1/S2 +, no rubs, no gallops Respiratory: CTA bilaterally, no wheezing, no rhonchi Abdominal: Soft, NT, ND, bowel sounds + Extremities: no edema, no cyanosis   Discharge Instructions  Discharge Instructions    Diet - low sodium heart healthy   Complete by:  As directed    Diet Carb Modified   Complete by:  As directed    Increase activity  slowly   Complete by:  As directed      Allergies as of 11/05/2018      Reactions   Garlic Nausea And Vomiting      Medication List    STOP taking these medications   ibuprofen 200 MG tablet Commonly known as:  ADVIL,MOTRIN     TAKE these medications   amLODipine 5 MG tablet Commonly known as:  NORVASC Take 1 tablet (5 mg total) by mouth daily.   atorvastatin 10 MG tablet Commonly known as:  LIPITOR Take 1 tablet (10 mg total) by mouth daily. What changed:  how much to take   carvedilol 3.125 MG tablet Commonly known as:  COREG Take 1 tablet (3.125 mg total) by mouth 2 (two) times daily with a meal.   fluticasone 50 MCG/ACT nasal  spray Commonly known as:  FLONASE Place 2 sprays into both nostrils daily.   gabapentin 300 MG capsule Commonly known as:  NEURONTIN Take 1 capsule (300 mg total) by mouth 2 (two) times daily.   glucose blood test strip Commonly known as:  TRUE METRIX BLOOD GLUCOSE TEST Use as instructed   glucose blood test strip Commonly known as:  TRUE METRIX BLOOD GLUCOSE TEST Use as instructed   insulin aspart 100 UNIT/ML injection Commonly known as:  NOVOLOG INJECT 0 TO 10 UNITS UNDER THE SKIN 3 TIMES A DAY WITH MEALS What changed:    how much to take  how to take this  when to take this  additional instructions   insulin NPH Human 100 UNIT/ML injection Commonly known as:  HUMULIN N,NOVOLIN N 41 units in a.m subcut and 30 units in p.m What changed:    how much to take  how to take this  when to take this  additional instructions   TRUE METRIX METER w/Device Kit 37 Units/day by Does not apply route 4 (four) times daily.   TRUEPLUS LANCETS 28G Misc 37 Units by Does not apply route 4 (four) times daily.   Vitamin D (Ergocalciferol) 1.25 MG (50000 UT) Caps capsule Commonly known as:  DRISDOL Take 1 capsule (50,000 Units total) by mouth every 7 (seven) days.      Follow-up Information    Ladell Pier, MD In 1 week.   Specialty:  Internal Medicine Why:  Npo after MN for LAB WORK.Marland Kitchenappt. Feb. 12 at [facility] information: [address] [place] [address] [phone]          Allergies  Allergen Reactions  . Garlic Nausea And Vomiting     Procedures/Studies:   Dg Chest 2 View  Result Date: 11/03/2018 CLINICAL DATA:  Central chest pain for 1 day. Hypertension and diabetes. Nonsmoker. EXAM: CHEST - 2 VIEW COMPARISON:  None. FINDINGS: The heart size and mediastinal contours are within normal limits. Both lungs are clear. The visualized skeletal structures are unremarkable. IMPRESSION: No active cardiopulmonary disease. Electronically Signed   By:  Lucienne Capers M.D.   On: 11/03/2018 19:05     The results of significant diagnostics from this hospitalization (including imaging, microbiology, ancillary and laboratory) are listed below for reference.     Microbiology: No results found for this or any previous visit (from the past 240 hour(s)).   Labs: BNP (last 3 results) No results for input(s): BNP in the last 8760 hours. Basic Metabolic Panel: Recent Labs  Lab 11/03/18 1832 11/04/18 0600 11/05/18 0510  NA 138 141 142  K 5.2* 4.3 4.2  CL 109 114* 116*  CO2 19* 18* 22  GLUCOSE 115* 95 95  BUN 38* 32* 28*  CREATININE 4.53* 3.92* 3.26*  CALCIUM 9.2 8.7* 8.4*   Liver Function Tests: No results for input(s): AST, ALT, ALKPHOS, BILITOT, PROT, ALBUMIN in the last 168 hours. No results for input(s): LIPASE, AMYLASE in the last 168 hours. No results for input(s): AMMONIA in the last 168 hours. CBC: Recent Labs  Lab 11/03/18 1832 11/04/18 0600 11/05/18 0510  WBC 5.2 4.7 4.4  HGB 9.4* 9.2* 8.3*  HCT 31.4* 29.1* 26.3*  MCV 87.0 85.8 87.4  PLT 224 201 187   Cardiac Enzymes: Recent Labs  Lab 11/04/18 0107 11/04/18 0600 11/04/18 1155  TROPONINI <0.03 <0.03 <0.03   BNP: Invalid input(s): POCBNP CBG: Recent Labs  Lab 11/04/18 0727 11/04/18 1126 11/04/18 1717 11/04/18 2239 11/05/18 0724  GLUCAP 82 143* 90 89 96   D-Dimer No results for input(s): DDIMER in the last 72 hours. Hgb A1c Recent Labs    11/04/18 0600  HGBA1C 6.3*   Lipid Profile Recent Labs    11/04/18 0600  CHOL 221*  HDL 48  LDLCALC 153*  TRIG 101  CHOLHDL 4.6   Thyroid function studies No results for input(s): TSH, T4TOTAL, T3FREE, THYROIDAB in the last 72 hours.  Invalid input(s): FREET3 Anemia work up Recent Labs    11/05/18 0510  VITAMINB12 320  FOLATE 6.8  FERRITIN 54  TIBC 223*  IRON 21*  RETICCTPCT 0.7   Urinalysis    Component Value Date/Time   COLORURINE YELLOW 10/19/2015 1552   APPEARANCEUR CLEAR  10/19/2015 1552   LABSPEC 1.023 10/19/2015 1552   LABSPEC 1.025 09/07/2011 1500   PHURINE 6.0 10/19/2015 1552   GLUCOSEU >1000 (A) 10/19/2015 1552   HGBUR MODERATE (A) 10/19/2015 1552   BILIRUBINUR NEGATIVE 10/19/2015 1552   BILIRUBINUR neg 10/18/2015 1421   BILIRUBINUR Negative 09/07/2011 1500   KETONESUR 15 (A) 10/19/2015 1552   PROTEINUR >300 (A) 10/19/2015 1552   UROBILINOGEN 0.2 10/18/2015 1421   NITRITE NEGATIVE 10/19/2015 1552   LEUKOCYTESUR NEGATIVE 10/19/2015 1552   LEUKOCYTESUR Negative 09/07/2011 1500   Sepsis Labs Invalid input(s): PROCALCITONIN,  WBC,  LACTICIDVEN Microbiology No results found for this or any previous visit (from the past 240 hour(s)).   Time coordinating discharge in minutes: 55  SIGNED:   Debbe Odea, MD  [facility] 11/05/2018, 1:22 PM Pager   If 7PM-7AM, please contact night-coverage [email or web address] Password [email or web address]

## 2018-11-05 NOTE — Discharge Instructions (Signed)
Avoid Aspirin, Ibuprofen and Naprosyn.    You were cared for by a hospitalist during your hospital stay. If you have any questions about your discharge medications or the care you received while you were in the hospital after you are discharged, you can call the unit and asked to speak with the hospitalist on call if the hospitalist that took care of you is not available. Once you are discharged, your primary care physician will handle any further medical issues.   Please note that NO REFILLS for any discharge medications will be authorized once you are discharged, as it is imperative that you return to your primary care physician (or establish a relationship with a primary care physician if you do not have one) for your aftercare needs so that they can reassess your need for medications and monitor your lab values.  Please take all your medications with you for your next visit with your Primary MD. Please ask your Primary MD to get all Hospital records sent to his/her office. Please request your Primary MD to go over all hospital test results at the follow up.   If you experience worsening of your admission symptoms, develop shortness of breath, chest pain, suicidal or homicidal thoughts or a life threatening emergency, you must seek medical attention immediately by calling 911 or calling your MD.   Dennis Bast must read the complete instructions/literature along with all the possible adverse reactions/side effects for all the medicines you take including new medications that have been prescribed to you. Take new medicines after you have completely understood and accpet all the possible adverse reactions/side effects.    Do not drive when taking pain medications or sedatives.     Do not take more than prescribed Pain, Sleep and Anxiety Medications   If you have smoked or chewed Tobacco in the last 2 yrs please stop. Stop any regular alcohol  and or recreational drug use.   Wear Seat belts while  driving.

## 2018-11-12 ENCOUNTER — Ambulatory Visit: Payer: Self-pay | Attending: Internal Medicine

## 2018-11-12 DIAGNOSIS — E875 Hyperkalemia: Secondary | ICD-10-CM | POA: Insufficient documentation

## 2018-11-12 DIAGNOSIS — E10319 Type 1 diabetes mellitus with unspecified diabetic retinopathy without macular edema: Secondary | ICD-10-CM | POA: Insufficient documentation

## 2018-11-12 DIAGNOSIS — D649 Anemia, unspecified: Secondary | ICD-10-CM | POA: Insufficient documentation

## 2018-11-12 NOTE — Telephone Encounter (Signed)
-----   Message from Ladell Pier, MD sent at 11/12/2018  9:34 AM EST ----- Regarding: hospital f/u appt Please call this pt and give her a hosp f/u appt with me.

## 2018-11-12 NOTE — Telephone Encounter (Signed)
Called the patient no answer lvm for her to call back.

## 2018-11-13 ENCOUNTER — Other Ambulatory Visit: Payer: Self-pay | Admitting: Internal Medicine

## 2018-11-13 LAB — CBC WITH DIFFERENTIAL/PLATELET
Basophils Absolute: 0 10*3/uL (ref 0.0–0.2)
Basos: 1 %
EOS (ABSOLUTE): 0.1 10*3/uL (ref 0.0–0.4)
Eos: 2 %
Hematocrit: 28.3 % — ABNORMAL LOW (ref 34.0–46.6)
Hemoglobin: 8.9 g/dL — ABNORMAL LOW (ref 11.1–15.9)
Immature Grans (Abs): 0 10*3/uL (ref 0.0–0.1)
Immature Granulocytes: 0 %
Lymphocytes Absolute: 1.6 10*3/uL (ref 0.7–3.1)
Lymphs: 32 %
MCH: 26.4 pg — ABNORMAL LOW (ref 26.6–33.0)
MCHC: 31.4 g/dL — ABNORMAL LOW (ref 31.5–35.7)
MCV: 84 fL (ref 79–97)
Monocytes Absolute: 0.4 10*3/uL (ref 0.1–0.9)
Monocytes: 8 %
Neutrophils Absolute: 2.8 10*3/uL (ref 1.4–7.0)
Neutrophils: 57 %
Platelets: 217 10*3/uL (ref 150–450)
RBC: 3.37 x10E6/uL — ABNORMAL LOW (ref 3.77–5.28)
RDW: 12.9 % (ref 11.7–15.4)
WBC: 5 10*3/uL (ref 3.4–10.8)

## 2018-11-13 LAB — CMP14+EGFR
ALT: 13 IU/L (ref 0–32)
AST: 11 IU/L (ref 0–40)
Albumin/Globulin Ratio: 1.3 (ref 1.2–2.2)
Albumin: 3.8 g/dL (ref 3.8–4.8)
Alkaline Phosphatase: 73 IU/L (ref 39–117)
BUN/Creatinine Ratio: 9 (ref 9–23)
BUN: 34 mg/dL — ABNORMAL HIGH (ref 6–20)
Bilirubin Total: 0.2 mg/dL (ref 0.0–1.2)
CO2: 19 mmol/L — ABNORMAL LOW (ref 20–29)
Calcium: 9.2 mg/dL (ref 8.7–10.2)
Chloride: 112 mmol/L — ABNORMAL HIGH (ref 96–106)
Creatinine, Ser: 3.66 mg/dL — ABNORMAL HIGH (ref 0.57–1.00)
GFR calc Af Amer: 17 mL/min/{1.73_m2} — ABNORMAL LOW (ref >59)
GFR calc non Af Amer: 15 mL/min/{1.73_m2} — ABNORMAL LOW (ref >59)
Globulin, Total: 2.9 g/dL (ref 1.5–4.5)
Glucose: 71 mg/dL (ref 65–99)
Potassium: 5.4 mmol/L — ABNORMAL HIGH (ref 3.5–5.2)
Sodium: 146 mmol/L — ABNORMAL HIGH (ref 134–144)
Total Protein: 6.7 g/dL (ref 6.0–8.5)

## 2018-11-13 LAB — IRON,TIBC AND FERRITIN PANEL
Ferritin: 60 ng/mL (ref 15–150)
Iron Saturation: 29 % (ref 15–55)
Iron: 66 ug/dL (ref 27–159)
Total Iron Binding Capacity: 226 ug/dL — ABNORMAL LOW (ref 250–450)
UIBC: 160 ug/dL (ref 131–425)

## 2018-11-13 MED ORDER — SODIUM POLYSTYRENE SULFONATE 15 GM/60ML PO SUSP: 15.0000 g | Freq: Once | ORAL | 0 refills | Status: DC

## 2018-11-17 ENCOUNTER — Telehealth: Payer: Self-pay | Admitting: Internal Medicine

## 2018-11-17 NOTE — Telephone Encounter (Signed)
Called and scheduled patient with Dr. Lenna Gilford on 12/04/18.

## 2018-11-18 ENCOUNTER — Other Ambulatory Visit: Payer: Self-pay | Admitting: Internal Medicine

## 2018-11-18 MED ORDER — SODIUM POLYSTYRENE SULFONATE 15 GM/60ML PO SUSP: 15.0000 g | Freq: Once | ORAL | 0 refills | Status: AC

## 2018-12-03 MED FILL — GABAPENTIN 300 MG CAPSULE: 300 | 30 days supply | Qty: 60 | Fill #1

## 2018-12-03 MED FILL — ?CARVEDILOL 3.125 MG TABLET: 3.125 | 30 days supply | Qty: 60 | Fill #2

## 2018-12-03 MED FILL — AMLODIPINE BESYLATE 5 MG TA: 5 | 30 days supply | Qty: 30 | Fill #2

## 2018-12-04 ENCOUNTER — Ambulatory Visit: Payer: Self-pay | Attending: Pulmonary Disease | Admitting: Pulmonary Disease

## 2018-12-04 ENCOUNTER — Encounter: Payer: Self-pay | Admitting: Pulmonary Disease

## 2018-12-04 VITALS — BP 149/86 | HR 80 | Temp 98.5°F | Ht 61.0 in | Wt 156.8 lb

## 2018-12-04 DIAGNOSIS — D649 Anemia, unspecified: Secondary | ICD-10-CM

## 2018-12-04 DIAGNOSIS — E875 Hyperkalemia: Secondary | ICD-10-CM

## 2018-12-04 DIAGNOSIS — E782 Mixed hyperlipidemia: Secondary | ICD-10-CM

## 2018-12-04 DIAGNOSIS — N184 Chronic kidney disease, stage 4 (severe): Secondary | ICD-10-CM | POA: Insufficient documentation

## 2018-12-04 DIAGNOSIS — E1029 Type 1 diabetes mellitus with other diabetic kidney complication: Secondary | ICD-10-CM

## 2018-12-04 DIAGNOSIS — I10 Essential (primary) hypertension: Secondary | ICD-10-CM

## 2018-12-04 DIAGNOSIS — E1065 Type 1 diabetes mellitus with hyperglycemia: Secondary | ICD-10-CM

## 2018-12-04 DIAGNOSIS — IMO0002 Reserved for concepts with insufficient information to code with codable children: Secondary | ICD-10-CM

## 2018-12-04 LAB — GLUCOSE, POCT (MANUAL RESULT ENTRY): POC Glucose: 82 mg/dl (ref 70–99)

## 2018-12-04 NOTE — Patient Instructions (Signed)
Today we updated your med list in our EPIC system...    Continue your current medications the same...  Today we also checked a follow up Metabolic Panel...  We will contact you w/ the results when available...   Your last A1c test was 6.3 on 11/04/2018  Be sure to keep all your scheduled appts at [facility]...  Let's plan a follow up visit w/ DrJohnson in 51months, sooner if needed for problems.Marland KitchenMarland Kitchen

## 2018-12-04 NOTE — Progress Notes (Addendum)
Established Patient Office Visit  Subjective:  Patient ID: Cathy Green, female    DOB: [birth year]  Age: 38 y.o. MRN: [id number]  CC:  Chief Complaint  Patient presents with  . Hospital Follow-up  . Diabetes and CKD- stage4    HPI Cathy Green is a 38 y/o woman who work in [employer] at [employer], pt of Cathy Green with DM1 and CKD-stage4 here for a post hospital follow up visit...  She was ADM 11/03/2018 at [facility] for acute on chr renal failure having been seen in the [facility] renal clinic that morning and labs returned w/ Cr=4.53 (up from baseline of 3.7) and K=5.8.  She was given IV fluids and Kayexalate w/ improement and Cr down to 3.26 at discharge.  No etiology for the acute episode was identified as she denied n/v/d, was drinking plenty of fluids, etc... she had some mild chest discomfort but EKG was wnl and Troponins were neg, CP resolved on its own.  She was reminded to avoid all NSAIDs and states she is good about this only using an occs tylenol for HAs...  She has been followed since Jan2020 by the renal team at [facility]- and currently they are watching her closely & hoping to get her estab w/ Medicaid so she can get listed for a poss kidney transplant...  Since she was discharged from the hosp she has had several BMet labs checked and her potassium remained elevated=> leading Cathy Green to call in Kayexalate PO on 2/13 and 2/18=> thn they arranged for thi follow up visit...  She reports feeling well- denies CP/ palpit/ edema, denies SOB/ cough/ sput, denies n/v/d/ abd pain, denies dysuria or voiding problems (she is non-oliguric).  Her sugars are well controlled on her regimen of HumulinN (41uQam & 30uQpm), plus Novolog Tid (taking 0-10u on sliding scale, averages one dose at dinner on most days);  Her BS was <100 in [place] and her last A1c=6.3 on 11/04/18...  She is also ANEMIC and was referred to the [place] division w/ extensive eval done and reviewed in Care Everywhere;  Her  Iron was low & sh is now taking an OTC iron supplement w/ improvement in her Iron numbers;  Her EPO level looked to be wnl & it does not appear as though she's been started on Aranesp as yet...  ADDENDUM:  LABS 3/5 returned w/ K=6.5, Cr-4.04, HCO3=18; we will request pt to increase her water intake further (more than she is currently drinking), she knows to follow a low potassium diet (provided by [facility] renal team), and we will order another dose of KAYEXALATE 15gm/38m suspension orally ASAP w/ repeat BMet to be done 3d after the dose...  ADDENDUM:  LABS 3/11 showed K=5.5, Cr-3.97, HCO3=18;  She needs another dose of KAYEXALATE 15gm/621mfor the potassium & rec to continue the orl fluids to stay well hydrated.  Looks like she will need NaHCO3 tabs as well w/ the persistently low Bicarb- call in Sodium Bicarb tabs #60 1 tab Bid w/ 5 refills.  She is asked to repeat the BMet in 1 wk...  ADDENDUM:  LABS 3/23 showed K=4.9, Cr=3.91, HCO3=18 and she never got the Bicarb.  I called pt & discussed results- Rx for Sodium Bicarb one tab Bid sent to [facility]... she is requested to repeat BMet again in 2 wks (wk of 4/6)...  ADDENDUM:  LABS 4/7 showed K=4.6, BS=120, Cr=3.75, HCO3=20 on the Bicarb Bid.  (AAngelina SheriffPlease let Cathy Green know that her labs from  4/7 look good- Potassium/ BS/ renal are all stable, and the bicarbonate level is improved). REC to continue her same meds, including the SodiumBicarb tabs (one tab Bid), and mark her calendar for a f/u BMet in 1 month (1st week of May)- place order please.  Thanks.  ADDENDUM:  LABS 427 remain stable w/ K=5.0, HCO3=20, Cr=3.80, BS=126... REC to continue low sodium/ low potassium diet (NO SALT OR SALT SUBSTITUTES, season w/ MrsDash prn)...  I see where she had a televisit w/ [facility]- Cathy Green on 01/15/19 & he was satisfied with her status and meds- no changes made... REC same meds>   HumulinN Bid, Novolog Tid (sliding scale)       Coreg3.125Bid, Amlod5, Lipitor20           Bicarb650Bid, Gabapentin300Bid, Iron325/d, VitD50K once per wk Next follow up labs w/ Cathy Green OV on 03/06/2019    Past Medical History:  Diagnosis Date  . Abnormal Pap smear   . Allergy    seasonal allergies  . Anemia -- currently on OTC FeSO4 daily & has had extensive work up by [place] team...   . Chronic kidney disease -- Stage 4 w/ baseline Cr ~3.5, followed at [facility] renal clinic since 10/2018 & notes avail in Care everywhere... Also taking Vit D 50K weekly...   . Diabetes mellitus -- Hx DM1 since age 38, currently well controlled on HumulinN (41uQam & 30uQpm) + Novolog 0-10u Tid on sliding scale;  A1c 11/2018=6.3    diagnosed age 38  . Glaucoma   . Hyperlipidemia -- on Atorva10   . Hypertension -- on Amlod5 & Coreg3.125Bid   . Migraines -- she knows to avoid all NSAIDs & just uses Tylenol...   . Hx of medication noncompliance   . Numbness in left leg - on Neuronitin 324m Bid     Past Surgical History:  Procedure Laterality Date  . WISDOM TOOTH EXTRACTION      Family History  Problem Relation Age of Onset  . Cancer Maternal Grandfather        prostate  . Hypertension Mother   . Stroke Mother   . Cancer Other   . COPD Other   . Hyperlipidemia Other   . Hypertension Father     Social History   Socioeconomic History  . Marital status: Single    Spouse name: Not on file  . Number of children: Not on file  . Years of education: Not on file  . Highest education level: Not on file  Occupational History  . Not on file  Social Needs  . Financial resource strain: Not on file  . Food insecurity:    Worry: Not on file    Inability: Not on file  . Transportation needs:    Medical: Not on file    Non-medical: Not on file  Tobacco Use  . Smoking status: Never Smoker  . Smokeless tobacco: Never Used  Substance and Sexual Activity  . Alcohol use: Yes    Comment: occasional   . Drug use: No  . Sexual activity: Never    Birth control/protection: Pill  Lifestyle   . Physical activity:    Days per week: Not on file    Minutes per session: Not on file  . Stress: Not on file  Relationships  . Social connections:    Talks on phone: Not on file    Gets together: Not on file    Attends religious service: Not on file    Active member of club  or organization: Not on file    Attends meetings of clubs or organizations: Not on file    Relationship status: Not on file  . Intimate partner violence:    Fear of current or ex partner: Not on file    Emotionally abused: Not on file    Physically abused: Not on file    Forced sexual activity: Not on file  Other Topics Concern  . Not on file  Social History Narrative   ** Merged History Encounter **        Outpatient Medications Prior to Visit  Medication Sig Dispense Refill  . amLODipine (NORVASC) 5 MG tablet Take 1 tablet (5 mg total) by mouth daily. 30 tablet 6  . atorvastatin (LIPITOR) 10 MG tablet Take 1 tablet (10 mg total) by mouth daily. (Patient taking differently: Take 20 mg by mouth daily. ) 90 tablet 3  . Blood Glucose Monitoring Suppl (TRUE METRIX METER) w/Device KIT 37 Units/day by Does not apply route 4 (four) times daily. 100 kit 3  . carvedilol (COREG) 3.125 MG tablet Take 1 tablet (3.125 mg total) by mouth 2 (two) times daily with a meal. 60 tablet 5  . fluticasone (FLONASE) 50 MCG/ACT nasal spray Place 2 sprays into both nostrils daily. 16 g 6  . gabapentin (NEURONTIN) 300 MG capsule Take 1 capsule (300 mg total) by mouth 2 (two) times daily. 60 capsule 1  . glucose blood (TRUE METRIX BLOOD GLUCOSE TEST) test strip Use as instructed 100 each 12  . insulin aspart (NOVOLOG) 100 UNIT/ML injection INJECT 0 TO 10 UNITS UNDER THE SKIN 3 TIMES A DAY WITH MEALS (Patient taking differently: Inject 0-10 Units into the skin 3 (three) times daily with meals. Based on sugar levels.) 30 mL 3  . insulin NPH Human (HUMULIN N,NOVOLIN N) 100 UNIT/ML injection 41 units in a.m subcut and 30 units in p.m  (Patient taking differently: Inject 30-41 Units into the skin See admin instructions. 41 units in a.m subcut and 30 units in p.m.) 20 mL 5  . TRUEPLUS LANCETS 28G MISC 37 Units by Does not apply route 4 (four) times daily. 100 each 3  . glucose blood (TRUE METRIX BLOOD GLUCOSE TEST) test strip Use as instructed 100 each 12  . Vitamin D, Ergocalciferol, (DRISDOL) 1.25 MG (50000 UT) CAPS capsule Take 1 capsule (50,000 Units total) by mouth every 7 (seven) days. (Patient not taking: Reported on 11/04/2018) 4 capsule 0   No facility-administered medications prior to visit.     Allergies  Allergen Reactions  . Garlic Nausea And Vomiting    Review of Systems       All symptoms NEG except where BOLDED >>  Constitutional:  F/C/S, fatigue, anorexia, unexpected weight change. HEENT:  HA, visual changes, hearing loss, earache, nasal symptoms, sore throat, mouth sores, hoarseness. Resp:  cough, sputum, hemoptysis; SOB, tightness, wheezing. Cardio:  CP, palpit, DOE, orthopnea, edema. GI:  N/V/D/C, blood in stool; reflux, abd pain, distention, gas. GU:  dysuria, freq, urgency, hematuria, flank pain, voiding difficulty. MS:  joint pain, swelling, tenderness, decr ROM; neck pain, back pain, etc. Neuro:  HA, tremors, seizures, dizziness, syncope, weakness, numbness, gait abn. Skin:  suspicious lesions or skin rash. Heme:  adenopathy, bruising, bleeding. Psyche:  confusion, agitation, sleep disturbance, hallucinations, anxiety, depression suicidal.     Objective:    Physical Exam BP (!) 149/86   Pulse 80   Temp 98.5 F (36.9 C) (Oral)   Ht '5\' 1"'  (1.549 m)  Wt 156 lb 12.8 oz (71.1 kg)   SpO2 100%   BMI 29.63 kg/m  Wt Readings from Last 3 Encounters:  12/04/18 156 lb 12.8 oz (71.1 kg)  11/04/18 145 lb 11.2 oz (66.1 kg)  05/31/18 153 lb 3.2 oz (69.5 kg)   General:  WD, WN, 38 y/o BF in NAD; alert & oriented; pleasant & cooperative... HEENT:  [place]/AT; Conjunctiva- pink, Sclera- nonicteric,  EOM-wnl, PERRLA; EACs-clear, TMs-wnl; NOSE-clear; THROAT-clear & wnl.  Neck:  Supple w/ full ROM; no JVD; normal carotid impulses w/o bruits; no thyromegaly or nodules palpated; no lymphadenopathy.  Chest:  Clear to P & A; without wheezes, rales, or rhonchi heard. Heart:  Regular Rhythm; norm S1 & S2 without murmurs, rubs, or gallops detected. Abdomen:  Obese, soft & nontender, +panniculus- no guarding or rebound; normal bowel sounds; no organomegaly or masses palpated. Ext:  Normal ROM; without deformities or arthritic changes; no varicose veins, venous insuffic, or edema;  Pulses intact w/o bruits. Neuro:  CNs II-XII intact; motor testing normal; sensory testing - mild neuropathy; gait normal & balance OK. Derm:  No lesions noted; no rash etc. Lymph:  No cervical, supraclavicular, axillary, or inguinal adenopathy palpated.   Health Maintenance Due  Topic Date Due  . OPHTHALMOLOGY EXAM  03/28/2018  . FOOT EXAM  03/29/2018    Lab Results  Component Value Date   TSH 2.320 02/14/2017   Lab Results  Component Value Date   WBC 5.0 11/12/2018   HGB 8.9 (L) 11/12/2018   HCT 28.3 (L) 11/12/2018   MCV 84 11/12/2018   PLT 217 11/12/2018   Lab Results  Component Value Date   NA 146 (H) 11/12/2018   K 5.4 (H) 11/12/2018   CO2 19 (L) 11/12/2018   GLUCOSE 71 11/12/2018   BUN 34 (H) 11/12/2018   CREATININE 3.66 (H) 11/12/2018   BILITOT 0.2 11/12/2018   ALKPHOS 73 11/12/2018   AST 11 11/12/2018   ALT 13 11/12/2018   PROT 6.7 11/12/2018   ALBUMIN 3.8 11/12/2018   CALCIUM 9.2 11/12/2018   ANIONGAP 4 (L) 11/05/2018   Lab Results  Component Value Date   CHOL 221 (H) 11/04/2018   Lab Results  Component Value Date   HDL 48 11/04/2018   Lab Results  Component Value Date   LDLCALC 153 (H) 11/04/2018   Lab Results  Component Value Date   TRIG 101 11/04/2018   Lab Results  Component Value Date   CHOLHDL 4.6 11/04/2018   Lab Results  Component Value Date   HGBA1C 6.3 (H)  11/04/2018      Assessment & Plan:   Problem List Items Addressed This Visit      Cardiovascular and Mediastinum   Hypertension (Chronic) -- BP controlled, continue same meds.     Endocrine   DM (diabetes mellitus), type  -- on NPH 41uQam and 30uQpm + Novolog 0-10u Tid sliding scale   Relevant Orders   POCT glucose (manual entry) (Completed) We did BMet today=> results pending...     Genitourinary   CKD (chronic kidney disease) stage 4, GFR 15-29 ml/min (HCC)   Relevant Orders   Basic Metabolic Panel=> rsults pending today...     Other   Hyperkalemia   Hyperlipidemia (Chronic)   Normocytic anemia     ADDENDUM:  LABS 3/5 returned w/ K=6.5, Cr-4.04, HCO3=18; we will request pt to increase her water intake further (more than she is currently drinking), she knows to follow a low potassium diet (provided by  [facility] renal team), and we will order another dose of KAYEXALATE 15gm/50m suspension orally ASAP w/ repeat BMet to be done 3d after the dose  Follow-up:  See AVS Return in about 3 months (around 03/06/2019).    STeressa Lower MD

## 2018-12-05 LAB — BASIC METABOLIC PANEL
BUN/Creatinine Ratio: 13 (ref 9–23)
BUN: 53 mg/dL — ABNORMAL HIGH (ref 6–20)
CO2: 18 mmol/L — ABNORMAL LOW (ref 20–29)
Calcium: 8.9 mg/dL (ref 8.7–10.2)
Chloride: 108 mmol/L — ABNORMAL HIGH (ref 96–106)
Creatinine, Ser: 4.04 mg/dL — ABNORMAL HIGH (ref 0.57–1.00)
GFR, EST AFRICAN AMERICAN: 15 mL/min/{1.73_m2} — AB (ref >59)
GFR, EST NON AFRICAN AMERICAN: 13 mL/min/{1.73_m2} — AB (ref >59)
Glucose: 84 mg/dL (ref 65–99)
Potassium: 6.5 mmol/L — ABNORMAL HIGH (ref 3.5–5.2)
Sodium: 138 mmol/L (ref 134–144)

## 2018-12-08 ENCOUNTER — Other Ambulatory Visit: Payer: Self-pay

## 2018-12-08 DIAGNOSIS — N184 Chronic kidney disease, stage 4 (severe): Secondary | ICD-10-CM

## 2018-12-10 ENCOUNTER — Other Ambulatory Visit: Payer: Self-pay

## 2018-12-10 ENCOUNTER — Ambulatory Visit: Payer: Self-pay | Attending: Family Medicine

## 2018-12-10 DIAGNOSIS — N184 Chronic kidney disease, stage 4 (severe): Secondary | ICD-10-CM

## 2018-12-11 ENCOUNTER — Other Ambulatory Visit: Payer: Self-pay

## 2018-12-11 LAB — BASIC METABOLIC PANEL
BUN/Creatinine Ratio: 12 (ref 9–23)
BUN: 49 mg/dL — AB (ref 6–20)
CALCIUM: 8.8 mg/dL (ref 8.7–10.2)
CO2: 18 mmol/L — ABNORMAL LOW (ref 20–29)
Chloride: 107 mmol/L — ABNORMAL HIGH (ref 96–106)
Creatinine, Ser: 3.97 mg/dL — ABNORMAL HIGH (ref 0.57–1.00)
GFR calc Af Amer: 16 mL/min/{1.73_m2} — ABNORMAL LOW (ref >59)
GFR calc non Af Amer: 14 mL/min/{1.73_m2} — ABNORMAL LOW (ref >59)
Glucose: 70 mg/dL (ref 65–99)
Potassium: 5.5 mmol/L — ABNORMAL HIGH (ref 3.5–5.2)
Sodium: 139 mmol/L (ref 134–144)

## 2018-12-11 MED ORDER — SODIUM POLYSTYRENE SULFONATE 15 GM/60ML PO SUSP: 15.0000 g | Freq: Once | ORAL | 0 refills | Status: AC

## 2018-12-11 NOTE — Telephone Encounter (Signed)
-----   Message from Noralee Space, MD sent at 12/06/2018  3:54 PM EST ----- Angelina Sheriff,  Please contact pt and notify her that her POTASSIUM is again elevated, and her renal function is worse-- She needs to increase her water intake further (drink more than she's been doing) and we need to order her a dose of KAYEXALATE 15gm/58ml suspension to take orally ASAP... then we need to order a follow up BMet to be done about 3d after the dose is taken...  Thanks!

## 2018-12-11 NOTE — Telephone Encounter (Signed)
Patient was called and informed of lab results and medication being sent to pharmacy. Patient is scheduled for labs on 12/22/18

## 2018-12-12 ENCOUNTER — Telehealth: Payer: Self-pay

## 2018-12-12 NOTE — Telephone Encounter (Signed)
-----   Message from Noralee Space, MD sent at 12/11/2018  4:20 PM EDT ----- Angelina Sheriff, please notify pt of this result>> Chem panel shows some improvement in her potassium & renal function... But she nees another dose of KAYEXALATE and needs to start a new med= Sodium Bicarbonate tabs taking one tab twice daily... Then we need to recheck her BMet on Monday 12/22/18

## 2018-12-12 NOTE — Telephone Encounter (Signed)
Patient was called and informed of lab results and medication being sent to pharmacy. Patient was also informed of lab appointment on 12/22/18

## 2018-12-16 ENCOUNTER — Encounter: Payer: Self-pay | Admitting: Internal Medicine

## 2018-12-17 ENCOUNTER — Telehealth: Payer: Self-pay | Admitting: Internal Medicine

## 2018-12-17 NOTE — Telephone Encounter (Signed)
Letter is printed and pt is aware to pick up letter tomorrow

## 2018-12-22 ENCOUNTER — Other Ambulatory Visit: Payer: Self-pay

## 2018-12-22 ENCOUNTER — Ambulatory Visit: Payer: Self-pay | Attending: Internal Medicine

## 2018-12-22 DIAGNOSIS — E875 Hyperkalemia: Secondary | ICD-10-CM

## 2018-12-23 LAB — BASIC METABOLIC PANEL
BUN/Creatinine Ratio: 10 (ref 9–23)
BUN: 39 mg/dL — AB (ref 6–20)
CO2: 18 mmol/L — ABNORMAL LOW (ref 20–29)
Calcium: 9.3 mg/dL (ref 8.7–10.2)
Chloride: 106 mmol/L (ref 96–106)
Creatinine, Ser: 3.91 mg/dL — ABNORMAL HIGH (ref 0.57–1.00)
GFR calc Af Amer: 16 mL/min/{1.73_m2} — ABNORMAL LOW (ref >59)
GFR calc non Af Amer: 14 mL/min/{1.73_m2} — ABNORMAL LOW (ref >59)
Glucose: 192 mg/dL — ABNORMAL HIGH (ref 65–99)
Potassium: 4.9 mmol/L (ref 3.5–5.2)
Sodium: 138 mmol/L (ref 134–144)

## 2018-12-23 MED ORDER — SODIUM BICARBONATE 650 MG PO TABS: 650.0000 mg | ORAL_TABLET | Freq: Two times a day (BID) | ORAL | 5 refills | Status: DC

## 2018-12-23 NOTE — Addendum Note (Signed)
Addended by: Noralee Space on: 12/23/2018 02:25 PM   Modules accepted: Orders

## 2018-12-24 MED FILL — SODIUM BICARB 650 MG TABLET: 650 | 30 days supply | Qty: 60 | Fill #0

## 2019-01-06 ENCOUNTER — Other Ambulatory Visit: Payer: Self-pay

## 2019-01-06 ENCOUNTER — Ambulatory Visit: Payer: Self-pay | Attending: Family Medicine

## 2019-01-06 DIAGNOSIS — E875 Hyperkalemia: Secondary | ICD-10-CM

## 2019-01-06 DIAGNOSIS — IMO0002 Reserved for concepts with insufficient information to code with codable children: Secondary | ICD-10-CM

## 2019-01-06 DIAGNOSIS — N184 Chronic kidney disease, stage 4 (severe): Secondary | ICD-10-CM

## 2019-01-06 DIAGNOSIS — E1065 Type 1 diabetes mellitus with hyperglycemia: Secondary | ICD-10-CM

## 2019-01-06 DIAGNOSIS — E1029 Type 1 diabetes mellitus with other diabetic kidney complication: Secondary | ICD-10-CM

## 2019-01-07 LAB — BASIC METABOLIC PANEL
BUN/Creatinine Ratio: 11 (ref 9–23)
BUN: 40 mg/dL — ABNORMAL HIGH (ref 6–20)
CO2: 20 mmol/L (ref 20–29)
Calcium: 9.4 mg/dL (ref 8.7–10.2)
Chloride: 105 mmol/L (ref 96–106)
Creatinine, Ser: 3.75 mg/dL — ABNORMAL HIGH (ref 0.57–1.00)
GFR calc Af Amer: 17 mL/min/{1.73_m2} — ABNORMAL LOW (ref >59)
GFR calc non Af Amer: 15 mL/min/{1.73_m2} — ABNORMAL LOW (ref >59)
Glucose: 120 mg/dL — ABNORMAL HIGH (ref 65–99)
Potassium: 4.6 mmol/L (ref 3.5–5.2)
Sodium: 139 mmol/L (ref 134–144)

## 2019-01-24 ENCOUNTER — Other Ambulatory Visit: Payer: Self-pay | Admitting: Internal Medicine

## 2019-01-24 DIAGNOSIS — E1042 Type 1 diabetes mellitus with diabetic polyneuropathy: Secondary | ICD-10-CM

## 2019-01-24 MED FILL — ?AMLODIPINE BESYLATE 5MG TA: 5 | 90 days supply | Qty: 90 | Fill #3

## 2019-01-24 MED FILL — ?CARVEDILOL 3.125 MG TABLET: 3.125 | 30 days supply | Qty: 60 | Fill #3

## 2019-01-24 MED FILL — ?ATORVASTATIN 10 MG TABLET: 10 | 90 days supply | Qty: 90 | Fill #2

## 2019-01-26 ENCOUNTER — Ambulatory Visit: Payer: Self-pay | Attending: Internal Medicine

## 2019-01-26 ENCOUNTER — Other Ambulatory Visit: Payer: Self-pay

## 2019-01-26 DIAGNOSIS — N184 Chronic kidney disease, stage 4 (severe): Secondary | ICD-10-CM

## 2019-01-27 LAB — BASIC METABOLIC PANEL
BUN/Creatinine Ratio: 9 (ref 9–23)
BUN: 33 mg/dL — ABNORMAL HIGH (ref 6–20)
CO2: 20 mmol/L (ref 20–29)
Calcium: 9.5 mg/dL (ref 8.7–10.2)
Chloride: 110 mmol/L — ABNORMAL HIGH (ref 96–106)
Creatinine, Ser: 3.8 mg/dL — ABNORMAL HIGH (ref 0.57–1.00)
GFR calc Af Amer: 17 mL/min/{1.73_m2} — ABNORMAL LOW (ref >59)
GFR calc non Af Amer: 14 mL/min/{1.73_m2} — ABNORMAL LOW (ref >59)
Glucose: 126 mg/dL — ABNORMAL HIGH (ref 65–99)
Potassium: 5 mmol/L (ref 3.5–5.2)
Sodium: 140 mmol/L (ref 134–144)

## 2019-01-27 MED FILL — GABAPENTIN 300 MG CAPSULE: 300 | 30 days supply | Qty: 60 | Fill #0

## 2019-02-26 MED FILL — SODIUM BICARB 650 MG TABLET: 650 | 30 days supply | Qty: 60 | Fill #1

## 2019-03-06 ENCOUNTER — Ambulatory Visit: Payer: Self-pay | Attending: Internal Medicine | Admitting: Internal Medicine

## 2019-03-06 DIAGNOSIS — N184 Chronic kidney disease, stage 4 (severe): Secondary | ICD-10-CM

## 2019-03-06 DIAGNOSIS — D631 Anemia in chronic kidney disease: Secondary | ICD-10-CM

## 2019-03-06 DIAGNOSIS — I10 Essential (primary) hypertension: Secondary | ICD-10-CM

## 2019-03-06 DIAGNOSIS — E782 Mixed hyperlipidemia: Secondary | ICD-10-CM

## 2019-03-06 DIAGNOSIS — E108 Type 1 diabetes mellitus with unspecified complications: Secondary | ICD-10-CM

## 2019-03-06 NOTE — Progress Notes (Signed)
Tele-visit  Due to current restrictions/limitations of in-office visits due to the COVID-19 pandemic, this scheduled clinical appointment was converted to a telehealth visit  I connected with Cathy Green on 03/06/19 at 3:44 p.m by a video enabled telemedicine application and verified that I am speaking with the correct person using two identifiers. I am in my office.  The patient is at home.  Only the patient and myself participated in this encounter.  I discussed the limitations of evaluation and management by telemedicine and the availability of in person appointments. The patient expressed understanding and agreed to proceed.  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 History of CKD stage III, HTNand vitamin D deficiency.  CKD 4/Anemia:  Did a Televisit 01/15/2019 with nephrology.  Told if eGFR drops to 10, she will be started on dialysis, pt opting for PD. -Has had some problems with hyperkalemia.  Avoiding K rich foods.  She will be having some blood test done next week as ordered by her nephrologist. Not on any ESA -still makes good urine.  No LE edema.  Little SOB  -He was taking ibuprofen for menstrual cramps but no longer does so.  HTN: checks BP 2 x a day.  Gives range BP120-141 but usually in 120-130, DBP range 70-99 This a.m BP 127/99 Reports compliance with medications amlodipine and carvedilol and salt restrictions.   No chronic headaches or dizziness  DM:  On NPH 40 a.m/30 evenings.  Not using NovoLog. Checking BS BID with range 80-120 Eating habits:  Doing a whole lot better Does exercise videos 3-4 x a wk -still sees ophth regularly.  Last appt was 11/2018  HL: compliant with Lipitor   Outpatient Encounter Medications as of 03/06/2019  Medication Sig  . amLODipine (NORVASC) 5 MG tablet Take 1 tablet (5 mg total) by mouth daily.  Marland Kitchen atorvastatin (LIPITOR) 10 MG tablet Take 1 tablet (10 mg total) by mouth  daily. (Patient taking differently: Take 20 mg by mouth daily. )  . Blood Glucose Monitoring Suppl (TRUE METRIX METER) w/Device KIT 37 Units/day by Does not apply route 4 (four) times daily.  . carvedilol (COREG) 3.125 MG tablet Take 1 tablet (3.125 mg total) by mouth 2 (two) times daily with a meal.  . fluticasone (FLONASE) 50 MCG/ACT nasal spray Place 2 sprays into both nostrils daily.  Marland Kitchen gabapentin (NEURONTIN) 300 MG capsule TAKE 1 CAPSULE (300 MG TOTAL) BY MOUTH 2 (TWO) TIMES DAILY.  Marland Kitchen glucose blood (TRUE METRIX BLOOD GLUCOSE TEST) test strip Use as instructed  . glucose blood (TRUE METRIX BLOOD GLUCOSE TEST) test strip Use as instructed  . insulin aspart (NOVOLOG) 100 UNIT/ML injection INJECT 0 TO 10 UNITS UNDER THE SKIN 3 TIMES A DAY WITH MEALS (Patient taking differently: Inject 0-10 Units into the skin 3 (three) times daily with meals. Based on sugar levels.)  . insulin NPH Human (HUMULIN N,NOVOLIN N) 100 UNIT/ML injection 41 units in a.m subcut and 30 units in p.m (Patient taking differently: Inject 30-41 Units into the skin See admin instructions. 41 units in a.m subcut and 30 units in p.m.)  . sodium bicarbonate 650 MG tablet Take 1 tablet (650 mg total) by mouth 2 (two) times daily.  . TRUEPLUS LANCETS 28G MISC 37 Units by Does not apply route 4 (four) times daily.  . Vitamin D, Ergocalciferol, (DRISDOL) 1.25 MG (50000 UT) CAPS capsule Take 1 capsule (50,000 Units total) by mouth every 7 (seven)  days. (Patient not taking: Reported on 11/04/2018)   No facility-administered encounter medications on file as of 03/06/2019.     Observations/Objective: Lab Results  Component Value Date   HGBA1C 6.3 (H) 11/04/2018    Assessment and Plan: . 1. Diabetes mellitus type 1, controlled, with complications ([place]) Reported blood sugars are at goal.  She will continue current dose of NPH insulin.  Continue healthy eating habits and regular exercise as tolerated.  2. CKD (chronic kidney disease)  stage 4, GFR 15-29 ml/min (HCC) Followed by nephrology at [place] to not use NSAIDs.  3. Mixed hyperlipidemia Continue atorvastatin  4. Anemia due to stage 4 chronic kidney disease (HCC) Continue iron supplement  5. Essential hypertension Systolic blood pressure could be better.  Continue to monitor.  Advised that the goal is 130/80 or lower   Follow Up Instructions: F/u in 2 mths   I discussed the assessment and treatment plan with the patient. The patient was provided an opportunity to ask questions and all were answered. The patient agreed with the plan and demonstrated an understanding of the instructions.   The patient was advised to call back or seek an in-person evaluation if the symptoms worsen or if the condition fails to improve as anticipated.  I provided 15 minutes of non-face-to-face time during this encounter.   Karle Plumber, MD

## 2019-03-10 MED FILL — ?CARVEDILOL 3.125 MG TABLET: 3.125 | 30 days supply | Qty: 60 | Fill #4

## 2019-03-10 MED FILL — ?ATORVASTATIN 10 MG TABLET: 10 | 30 days supply | Qty: 30 | Fill #3

## 2019-03-10 MED FILL — GABAPENTIN 300 MG CAPSULE: 300 | 30 days supply | Qty: 60 | Fill #1

## 2019-03-27 MED FILL — CALCITRIOL 0.25 MCG CAPS: 0.25 | 30 days supply | Qty: 12 | Fill #0

## 2019-04-16 MED FILL — ?ATORVASTATIN 10 MG TABLET: 10 | 30 days supply | Qty: 30 | Fill #4

## 2019-04-16 MED FILL — ?CARVEDILOL 3.125 MG TABLET: 3.125 | 30 days supply | Qty: 60 | Fill #5

## 2019-04-16 MED FILL — SODIUM BICARB 650 MG TABLET: 650 | 30 days supply | Qty: 60 | Fill #2

## 2019-05-13 MED FILL — ?AMLODIPINE BESYLATE 5MG TA: 5 | 30 days supply | Qty: 60 | Fill #0

## 2019-06-10 ENCOUNTER — Other Ambulatory Visit: Payer: Self-pay | Admitting: Internal Medicine

## 2019-06-10 DIAGNOSIS — I10 Essential (primary) hypertension: Secondary | ICD-10-CM

## 2019-06-10 MED FILL — ?CARVEDILOL 3.125 MG TABLET: 3.125 | 30 days supply | Qty: 60 | Fill #0

## 2019-06-10 MED FILL — CALCITRIOL 0.25 MCG CAPS: 0.25 | 30 days supply | Qty: 12 | Fill #0

## 2019-06-10 MED FILL — VIT D2 1.25 MG (50,000 UNIT: 1.25 MG | 180 days supply | Qty: 6 | Fill #0

## 2019-06-10 MED FILL — SODIUM BICARB 650 MG TABLET: 650 | 30 days supply | Qty: 60 | Fill #3

## 2019-06-19 MED FILL — SODIUM BICARB 650 MG TABLET: 650 | 30 days supply | Qty: 60 | Fill #3

## 2019-06-19 MED FILL — VIT D2 1.25 MG (50,000 UNIT: 1.25 MG | 180 days supply | Qty: 6 | Fill #0

## 2019-06-19 MED FILL — CALCITRIOL 0.25 MCG CAPS: 0.25 | 30 days supply | Qty: 12 | Fill #0

## 2019-06-19 MED FILL — ?CARVEDILOL 3.125 MG TABLET: 3.125 | 30 days supply | Qty: 60 | Fill #0

## 2019-06-22 ENCOUNTER — Other Ambulatory Visit: Payer: Self-pay

## 2019-06-22 ENCOUNTER — Ambulatory Visit: Payer: Self-pay | Attending: Internal Medicine | Admitting: Internal Medicine

## 2019-06-22 ENCOUNTER — Encounter: Payer: Self-pay | Admitting: Internal Medicine

## 2019-06-22 VITALS — BP 156/101 | HR 82 | Resp 16 | Wt 164.0 lb

## 2019-06-22 DIAGNOSIS — Z23 Encounter for immunization: Secondary | ICD-10-CM

## 2019-06-22 DIAGNOSIS — G43009 Migraine without aura, not intractable, without status migrainosus: Secondary | ICD-10-CM

## 2019-06-22 DIAGNOSIS — I10 Essential (primary) hypertension: Secondary | ICD-10-CM

## 2019-06-22 DIAGNOSIS — E1065 Type 1 diabetes mellitus with hyperglycemia: Secondary | ICD-10-CM

## 2019-06-22 DIAGNOSIS — IMO0002 Reserved for concepts with insufficient information to code with codable children: Secondary | ICD-10-CM

## 2019-06-22 DIAGNOSIS — E108 Type 1 diabetes mellitus with unspecified complications: Secondary | ICD-10-CM

## 2019-06-22 DIAGNOSIS — N184 Chronic kidney disease, stage 4 (severe): Secondary | ICD-10-CM

## 2019-06-22 LAB — POCT GLYCOSYLATED HEMOGLOBIN (HGB A1C): HbA1c, POC (controlled diabetic range): 8.1 % — AB (ref 0.0–7.0)

## 2019-06-22 LAB — POCT CBG (FASTING - GLUCOSE)-MANUAL ENTRY: Glucose Fasting, POC: 189 mg/dL — AB (ref 70–99)

## 2019-06-22 MED FILL — TIMOLOL 0.5% EYE DROPS: 0.5 | 30 days supply | Qty: 5 | Fill #0

## 2019-06-22 NOTE — Patient Instructions (Signed)
Try to get back to your routine walking.  This will help in getting your blood sugars and A1c back to normal range.  Try to get a battery for your blood pressure device and check the blood pressure at least 3-4 times a week.  Please bring in those readings on next visit.  Please take your blood pressure medications before coming to that visit.

## 2019-06-22 NOTE — Progress Notes (Signed)
Patient ID: Cathy Green, female    DOB: Feb 03, 1981  MRN: [id number]  CC: Follow-up   Subjective: Cathy Green is a 38 y.o. 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 History of CKD stage 4-5, ACD, HTNand vitamin D deficiency.  CKD stage 4-5:  Saw Dr. Gwynneth Macleod 05/2019.  Plan is to get her on the transplant list.  However she was approved for Medicaid but it is family planning Medicaid only  HYPERTENSION Currently taking: see medication list - Coreg and Norvasc.  Did no ake meds as yet for the morning. Med Adherence: '[x]'  Yes  .  Norvasc inc to 5 mg BID Medication side effects: '[]'  Yes    '[x]'  No Adherence with salt restriction: '[x]'  Yes    '[]'  No Home Monitoring?: '[x]'  Yes - except battery gave out last wk; was checking BID Monitoring Frequency: '[]'  Yes    '[]'  No Home BP results range: 125/88, 157/98, 105/75, 128/89 SOB? '[]'  Yes    '[x]'  No Chest Pain?: '[]'  Yes    '[x]'  No Leg swelling?: '[x]'  Yes    '[]'  No Headaches?: '[x]'  Yes  -morning HA that last all day.  Started end of July.  Since then she has had about 10-15 episodes.  Blurrred vision at times.  +pheno/photophobia.  No N/V.  HA all over.  Better laying down. No triggers identified or makes them worse.  Takes Tylenol PRN which does not help.  Gives past hx of migraine dx at age 47. Prescribed various meds in past which did no help. Current HA are not as intense.   Dizziness? '[]'  Yes    '[x]'  No Comments:   DIABETES TYPE 2 Last A1C:   Results for orders placed or performed in visit on 06/22/19  HgB A1c  Result Value Ref Range   Hemoglobin A1C     HbA1c POC (<> result, manual entry)     HbA1c, POC (prediabetic range)     HbA1c, POC (controlled diabetic range) 8.1 (A) 0.0 - 7.0 %  Glucose (CBG), Fasting  Result Value Ref Range   Glucose Fasting, POC 189 (A) 70 - 99 mg/dL    Med Adherence:  '[x]'  Yes    '[]'  No Medication side  effects:  '[]'  Yes    '[x]'  No Home Monitoring?  '[x]'  Yes  -before BF and dinner Home glucose results range: does not have log with her.  Gives range before BF as 80-120; before dinner 110-135.  Highest was 215 Diet Adherence: '[x]'  Yes but poor appetite lately.  Eating healthier, watches carb Exercise: "not as much as I use to.  That quarratine got me." Hypoglycemic episodes?: '[]'  Yes    '[x]'  No Numbness of the feet? '[x]'  Yes mainly LT big toe.  Ran out of Gabapentin Retinopathy hx? '[x]'  Yes    '[]'  No Last eye exam: has appt today Comments:    Patient Active Problem List   Diagnosis Date Noted  . CKD (chronic kidney disease) stage 4, GFR 15-29 ml/min (HCC) 12/04/2018  . Chest pain 11/04/2018  . Hyperkalemia 11/04/2018  . Diabetic retinopathy ([place]) 04/26/2017  . Proteinuria 11/01/2015  . Normocytic anemia 09/07/2011  . DM (diabetes mellitus), type 1, uncontrolled, with renal complications ([place]) 23/55/7322  . Hyperlipidemia 09/07/2011  . Hypertension 09/07/2011     Current Outpatient Medications on File Prior to Visit  Medication Sig Dispense Refill  . amLODipine (NORVASC)  5 MG tablet Take 1 tablet (5 mg total) by mouth daily. 30 tablet 6  . atorvastatin (LIPITOR) 10 MG tablet Take 1 tablet (10 mg total) by mouth daily. (Patient taking differently: Take 20 mg by mouth daily. ) 90 tablet 3  . carvedilol (COREG) 3.125 MG tablet TAKE 1 TABLET (3.125 MG TOTAL) BY MOUTH 2 (TWO) TIMES DAILY WITH A MEAL. 60 tablet 0  . fluticasone (FLONASE) 50 MCG/ACT nasal spray Place 2 sprays into both nostrils daily. 16 g 6  . insulin NPH Human (HUMULIN N,NOVOLIN N) 100 UNIT/ML injection 41 units in a.m subcut and 30 units in p.m (Patient taking differently: Inject 30-41 Units into the skin See admin instructions. 41 units in a.m subcut and 30 units in p.m.) 20 mL 5  . sodium bicarbonate 650 MG tablet Take 1 tablet (650 mg total) by mouth 2 (two) times daily. 60 tablet 5  . Vitamin D, Ergocalciferol, (DRISDOL) 1.25  MG (50000 UT) CAPS capsule Take 1 capsule (50,000 Units total) by mouth every 7 (seven) days. 4 capsule 0  . Blood Glucose Monitoring Suppl (TRUE METRIX METER) w/Device KIT 37 Units/day by Does not apply route 4 (four) times daily. 100 kit 3  . gabapentin (NEURONTIN) 300 MG capsule TAKE 1 CAPSULE (300 MG TOTAL) BY MOUTH 2 (TWO) TIMES DAILY. (Patient not taking: Reported on 06/22/2019) 60 capsule 1  . glucose blood (TRUE METRIX BLOOD GLUCOSE TEST) test strip Use as instructed 100 each 12  . glucose blood (TRUE METRIX BLOOD GLUCOSE TEST) test strip Use as instructed 100 each 12  . TRUEPLUS LANCETS 28G MISC 37 Units by Does not apply route 4 (four) times daily. 100 each 3   No current facility-administered medications on file prior to visit.     Allergies  Allergen Reactions  . Garlic Nausea And Vomiting    Social History   Socioeconomic History  . Marital status: Single    Spouse name: Not on file  . Number of children: Not on file  . Years of education: Not on file  . Highest education level: Not on file  Occupational History  . Not on file  Social Needs  . Financial resource strain: Not on file  . Food insecurity    Worry: Not on file    Inability: Not on file  . Transportation needs    Medical: Not on file    Non-medical: Not on file  Tobacco Use  . Smoking status: Never Smoker  . Smokeless tobacco: Never Used  Substance and Sexual Activity  . Alcohol use: Yes    Comment: occasional   . Drug use: No  . Sexual activity: Never    Birth control/protection: Pill  Lifestyle  . Physical activity    Days per week: Not on file    Minutes per session: Not on file  . Stress: Not on file  Relationships  . Social Herbalist on phone: Not on file    Gets together: Not on file    Attends religious service: Not on file    Active member of club or organization: Not on file    Attends meetings of clubs or organizations: Not on file    Relationship status: Not on file   . Intimate partner violence    Fear of current or ex partner: Not on file    Emotionally abused: Not on file    Physically abused: Not on file    Forced sexual activity: Not on file  Other Topics Concern  . Not on file  Social History Narrative   ** Merged History Encounter **        Family History  Problem Relation Age of Onset  . Cancer Maternal Grandfather        prostate  . Hypertension Mother   . Stroke Mother   . Cancer Other   . COPD Other   . Hyperlipidemia Other   . Hypertension Father     Past Surgical History:  Procedure Laterality Date  . WISDOM TOOTH EXTRACTION      ROS: Review of Systems Negative except as stated above  PHYSICAL EXAM: BP (!) 156/101 (BP Location: Left Arm, Patient Position: Sitting, Cuff Size: Normal)   Pulse 82   Resp 16   Wt 164 lb (74.4 kg)   SpO2 100%   BMI 30.99 kg/m   Wt Readings from Last 3 Encounters:  06/22/19 164 lb (74.4 kg)  12/04/18 156 lb 12.8 oz (71.1 kg)  11/04/18 145 lb 11.2 oz (66.1 kg)    Physical Exam  General appearance - alert, well appearing, and in no distress Mental status - normal mood, behavior, speech, dress, motor activity, and thought processes Mouth - mucous membranes moist, pharynx normal without lesions Neck - supple, no significant adenopathy Chest - clear to auscultation, no wheezes, rales or rhonchi, symmetric air entry Heart - normal rate, regular rhythm, normal S1, S2, no murmurs, rubs, clicks or gallops Extremities - peripheral pulses normal, no pedal edema, no clubbing or cyanosis Diabetic Foot Exam - Simple   Simple Foot Form Visual Inspection No deformities, no ulcerations, no other skin breakdown bilaterally: Yes Sensation Testing Intact to touch and monofilament testing bilaterally: Yes Pulse Check Posterior Tibialis and Dorsalis pulse intact bilaterally: Yes Comments      CMP Latest Ref Rng & Units 01/26/2019 01/06/2019 12/22/2018  Glucose 65 - 99 mg/dL 126(H) 120(H) 192(H)   BUN 6 - 20 mg/dL 33(H) 40(H) 39(H)  Creatinine 0.57 - 1.00 mg/dL 3.80(H) 3.75(H) 3.91(H)  Sodium 134 - 144 mmol/L 140 139 138  Potassium 3.5 - 5.2 mmol/L 5.0 4.6 4.9  Chloride 96 - 106 mmol/L 110(H) 105 106  CO2 20 - 29 mmol/L 20 20 18(L)  Calcium 8.7 - 10.2 mg/dL 9.5 9.4 9.3  Total Protein 6.0 - 8.5 g/dL - - -  Total Bilirubin 0.0 - 1.2 mg/dL - - -  Alkaline Phos 39 - 117 IU/L - - -  AST 0 - 40 IU/L - - -  ALT 0 - 32 IU/L - - -   Lipid Panel     Component Value Date/Time   CHOL 221 (H) 11/04/2018 0600   CHOL 227 (H) 05/27/2018 1602   TRIG 101 11/04/2018 0600   HDL 48 11/04/2018 0600   HDL 59 05/27/2018 1602   CHOLHDL 4.6 11/04/2018 0600   VLDL 20 11/04/2018 0600   LDLCALC 153 (H) 11/04/2018 0600   LDLCALC 140 (H) 05/27/2018 1602    CBC    Component Value Date/Time   WBC 5.0 11/12/2018 1030   WBC 4.4 11/05/2018 0510   RBC 3.37 (L) 11/12/2018 1030   RBC 3.01 (L) 11/05/2018 0510   RBC 3.01 (L) 11/05/2018 0510   HGB 8.9 (L) 11/12/2018 1030   HGB 11.6 09/07/2011 1500   HCT 28.3 (L) 11/12/2018 1030   HCT 34.7 (L) 09/07/2011 1500   PLT 217 11/12/2018 1030   MCV 84 11/12/2018 1030   MCV 79.4 (L) 09/07/2011 1500   MCH 26.4 (  L) 11/12/2018 1030   MCH 27.6 11/05/2018 0510   MCHC 31.4 (L) 11/12/2018 1030   MCHC 31.6 11/05/2018 0510   RDW 12.9 11/12/2018 1030   RDW 13.8 09/07/2011 1500   LYMPHSABS 1.6 11/12/2018 1030   LYMPHSABS 1.7 09/07/2011 1500   MONOABS 0.3 05/31/2018 1005   MONOABS 0.2 09/07/2011 1500   EOSABS 0.1 11/12/2018 1030   BASOSABS 0.0 11/12/2018 1030   BASOSABS 0.0 09/07/2011 1500    ASSESSMENT AND PLAN: 1. Diabetes mellitus type 1, uncontrolled, with complications ([place] [place]) I have made no changes to her insulin dose.  I have requested that she bring in a log of blood sugar readings on next visit . A1c is above goal today however, A1c sometimes is not as accurate in patients with advanced CKD and anemia.  So it would be good to see her blood sugar logs  -Encouraged her to try to be more active as this will help in getting her A1c down - HgB A1c - Glucose (CBG), Fasting  2. Essential hypertension Not at goal.  She has not taken medicines as yet for today.  Advised to take blood pressure medications before coming on next visit in a few weeks for recheck.  3. Migraine without aura and without status migrainosus, not intractable Discussed diagnosis and management.  Ideally would like to put her on Imitrex but given that her blood pressure is uncontrolled we will hold off.  Recommend that she continue to use Tylenol as needed.  4. CKD (chronic kidney disease) stage 4, GFR 15-29 ml/min (HCC) Followed by [facility]  Message sent to care manager to see if she can assist with getting her Medicaid changed from family-planning Medicaid to regular Medicaid.  Her nephrologist would like to get her on the transplant list  5. Need for immunization against influenza - Flu Vaccine QUAD 36+ mos IM    Patient was given the opportunity to ask questions.  Patient verbalized understanding of the plan and was able to repeat key elements of the plan.   Orders Placed This Encounter  Procedures  . Flu Vaccine QUAD 36+ mos IM  . HgB A1c  . Glucose (CBG), Fasting     Requested Prescriptions    No prescriptions requested or ordered in this encounter    Return in about 2 weeks (around 07/06/2019) for repeat BP check.  Karle Plumber, MD, FACP

## 2019-06-22 NOTE — Progress Notes (Signed)
Follow up visit

## 2019-06-23 ENCOUNTER — Telehealth: Payer: Self-pay

## 2019-06-23 NOTE — Telephone Encounter (Signed)
Call placed to patient at request of Dr Wynetta Emery.  She explained that she has only been approved for family planning medicaid. She hopes to be placed on the list for a kidney transplant but needs to have full medicaid. She was in agreement to making a referral to [employer] [place] for assistance.   Referral completed and faxed to attention of Abelino Derrick

## 2019-07-10 ENCOUNTER — Ambulatory Visit: Payer: Self-pay | Attending: Internal Medicine | Admitting: Pharmacist

## 2019-07-10 ENCOUNTER — Encounter: Payer: Self-pay | Admitting: Pharmacist

## 2019-07-10 ENCOUNTER — Ambulatory Visit: Payer: Medicaid Other | Admitting: Internal Medicine

## 2019-07-10 ENCOUNTER — Other Ambulatory Visit: Payer: Self-pay

## 2019-07-10 VITALS — BP 113/71 | HR 80

## 2019-07-10 DIAGNOSIS — I10 Essential (primary) hypertension: Secondary | ICD-10-CM

## 2019-07-10 NOTE — Progress Notes (Signed)
   S:    PCP: Dr. Wynetta Emery  Patient arrives in good spirits.    Presents to the clinic for hypertension evaluation, counseling, and management.  Patient was referred and last seen by Primary Care Provider on 06/22/19.   Denies chest pain, dyspnea, HA or blurred vision. No BLE edema.   Patient reports adherence with medications.  Current BP Medications include:  Carvedilol 3.125 mg BID, amlodipine 5 mg daily   Dietary habits include: limits salt; "love my caffeine"  Exercise habits include: 30 minutes to and from work; 1 hour total  Family / Social history:  - FHx: HTN (mother, father); stroke (mother) - Never smoker - Occasional glass of wine  O:  Today's Vitals   07/10/19 0936  BP: 113/71  Pulse: 80   Home BP readings:  Patient brings log from 9/25 - 07/09/19 SBPs: 104-128 DBPs: 66-83  Last 3 Office BP readings: BP Readings from Last 3 Encounters:  06/22/19 (!) 156/101  12/04/18 (!) 149/86  11/05/18 (!) 149/91   BMET    Component Value Date/Time   NA 140 01/26/2019 0916   K 5.0 01/26/2019 0916   CL 110 (H) 01/26/2019 0916   CO2 20 01/26/2019 0916   GLUCOSE 126 (H) 01/26/2019 0916   GLUCOSE 95 11/05/2018 0510   BUN 33 (H) 01/26/2019 0916   CREATININE 3.80 (H) 01/26/2019 0916   CREATININE 2.57 (H) 04/17/2016 1527   CALCIUM 9.5 01/26/2019 0916   GFRNONAA 14 (L) 01/26/2019 0916   GFRNONAA 32 (L) 11/17/2015 1522   GFRAA 17 (L) 01/26/2019 0916   GFRAA 37 (L) 11/17/2015 1522    Renal function: CrCl cannot be calculated (Patient's most recent lab result is older than the maximum 21 days allowed.).  Clinical ASCVD: No  The ASCVD Risk score Mikey Bussing DC Jr., et al., 2013) failed to calculate for the following reasons:   The 2013 ASCVD risk score is only valid for ages 51 to 74   A/P: Hypertension longstanding currently controlled on current medications. BP Goal = <130/80 mmHg. Patient is adherent with current medications. BP at home looks good. -Continued current  regimen.  -Counseled on lifestyle modifications for blood pressure control including reduced dietary sodium, increased exercise, adequate sleep  Results reviewed and written information provided. Total time in face-to-face counseling 15 minutes.   F/U Clinic Visit 09/10/2019 with PCP.   Benard Halsted, PharmD, [place] [phone]

## 2019-07-10 NOTE — Patient Instructions (Signed)

## 2019-07-21 ENCOUNTER — Other Ambulatory Visit: Payer: Self-pay | Admitting: Internal Medicine

## 2019-07-21 DIAGNOSIS — I10 Essential (primary) hypertension: Secondary | ICD-10-CM

## 2019-07-21 MED FILL — TIMOLOL 0.5% EYE DROPS: 0.5 | 30 days supply | Qty: 5 | Fill #1

## 2019-07-21 MED FILL — ?CARVEDILOL 3.125 MG TABLET: 3.125 | 30 days supply | Qty: 60 | Fill #0

## 2019-07-21 MED FILL — SODIUM BICARB 650 MG TABLET: 650 | 30 days supply | Qty: 60 | Fill #4

## 2019-07-21 MED FILL — ?AMLODIPINE BESYLATE 5MG TA: 5 | 30 days supply | Qty: 60 | Fill #1

## 2019-07-21 MED FILL — CALCITRIOL 0.25 MCG CAPS: 0.25 | 30 days supply | Qty: 12 | Fill #1

## 2019-07-22 ENCOUNTER — Telehealth: Payer: Self-pay

## 2019-07-22 ENCOUNTER — Telehealth: Payer: Self-pay | Admitting: Internal Medicine

## 2019-07-22 NOTE — Telephone Encounter (Signed)
As per Abelino Derrick, [employer] [place], the case is on going, the insurance Navigator is working with the patient.

## 2019-07-22 NOTE — Telephone Encounter (Signed)
Patient dropped off paperwork will be placed in PCP box

## 2019-07-23 MED FILL — LATANOPROST 0.005% EYE DRP: 0.005 | 18 days supply | Qty: 3 | Fill #0

## 2019-07-27 ENCOUNTER — Telehealth: Payer: Self-pay | Admitting: Internal Medicine

## 2019-07-27 NOTE — Telephone Encounter (Signed)
Contacted patient and informed of $10 paperwork fee and that fee has to be paid prior to forms being completed.   Patient understood and paperwork is in the needs payment bin.

## 2019-07-28 DIAGNOSIS — Z0289 Encounter for other administrative examinations: Secondary | ICD-10-CM

## 2019-08-05 ENCOUNTER — Encounter: Payer: Self-pay | Admitting: Internal Medicine

## 2019-08-05 NOTE — Progress Notes (Signed)
PC placed to pt today to discuss FMLA form that she dropped off to be filled out.  Pt reports she took an approved leave of absence from work 06/12/2019 to 07/21/2019 because she had to ride city bus and she was concern about catching COVID.  On 07/15/2019, she was informed by her employer that she will need to have FMLA form completed in order to get paid for the time she was off.  I asked pt how is she currently getting to work. She said someone drops her off.  I told her that in the future if she is going to take medical leave of absence from work, I need to know about it before hand.  Pt said she is aware of that but she had applied for the leave of absence and it was approved then her employer came back several wks later and requested that she have an FMLA done.

## 2019-08-06 ENCOUNTER — Telehealth: Payer: Self-pay

## 2019-08-06 NOTE — Telephone Encounter (Signed)
Contacted pt and made aware that FMLA is ready for pickup and if she has any questions or concerns to give me a call

## 2019-08-15 ENCOUNTER — Encounter (HOSPITAL_COMMUNITY): Payer: Self-pay | Admitting: Emergency Medicine

## 2019-08-15 ENCOUNTER — Other Ambulatory Visit: Payer: Self-pay

## 2019-08-15 ENCOUNTER — Emergency Department (HOSPITAL_COMMUNITY)
Admission: EM | Admit: 2019-08-15 | Discharge: 2019-08-15 | Disposition: A | Discharge status: Home / Self Care | Payer: Medicaid Other | Attending: Emergency Medicine | Admitting: Emergency Medicine

## 2019-08-15 DIAGNOSIS — E875 Hyperkalemia: Secondary | ICD-10-CM | POA: Insufficient documentation

## 2019-08-15 DIAGNOSIS — I129 Hypertensive chronic kidney disease with stage 1 through stage 4 chronic kidney disease, or unspecified chronic kidney disease: Secondary | ICD-10-CM | POA: Insufficient documentation

## 2019-08-15 DIAGNOSIS — Z79899 Other long term (current) drug therapy: Secondary | ICD-10-CM | POA: Insufficient documentation

## 2019-08-15 DIAGNOSIS — N184 Chronic kidney disease, stage 4 (severe): Secondary | ICD-10-CM | POA: Insufficient documentation

## 2019-08-15 DIAGNOSIS — Z794 Long term (current) use of insulin: Secondary | ICD-10-CM | POA: Insufficient documentation

## 2019-08-15 DIAGNOSIS — E1022 Type 1 diabetes mellitus with diabetic chronic kidney disease: Secondary | ICD-10-CM | POA: Insufficient documentation

## 2019-08-15 LAB — BASIC METABOLIC PANEL
Anion gap: 10 (ref 5–15)
BUN: 55 mg/dL — ABNORMAL HIGH (ref 6–20)
CO2: 19 mmol/L — ABNORMAL LOW (ref 22–32)
Calcium: 9.2 mg/dL (ref 8.9–10.3)
Chloride: 107 mmol/L (ref 98–111)
Creatinine, Ser: 4.65 mg/dL — ABNORMAL HIGH (ref 0.44–1.00)
GFR calc Af Amer: 13 mL/min — ABNORMAL LOW (ref >60)
GFR calc non Af Amer: 11 mL/min — ABNORMAL LOW (ref >60)
Glucose, Bld: 300 mg/dL — ABNORMAL HIGH (ref 70–99)
Potassium: 5.4 mmol/L — ABNORMAL HIGH (ref 3.5–5.1)
Sodium: 136 mmol/L (ref 135–145)

## 2019-08-15 MED ORDER — SODIUM POLYSTYRENE SULFONATE 15 GM/60ML PO SUSP
30.0000 g | Freq: Once | ORAL | Status: AC
  Administered 2019-08-15: 30 g via ORAL
  Filled 2019-08-15: qty 120

## 2019-08-15 NOTE — ED Triage Notes (Signed)
Pt states she had lab work at [facility] yesterday and was told to come to ED due to high potassium and high creatinine.  Denies any complaints at this time.

## 2019-08-15 NOTE — ED Provider Notes (Signed)
[facility] EMERGENCY DEPARTMENT Provider Note   CSN: [id number] Arrival date & time: 08/15/19  0915     History   Chief Complaint Chief Complaint  Patient presents with  . abnormal labs    HPI Cathy Green is a 38 y.o. female.      Illness Location:  Hyperkalemia at 6.0, elevated creatinine from baseline noted yesterday after regularly scheduled nephrologist appointment Quality:  Asymptomatic Severity:  Moderate Onset quality:  Gradual Timing: Patient has chronic kidney disease related to type 1 diabetes for which she follows with nephrology regularly as an outpatient.  She has had elevated potassium in the past for which she has had to take Kayexalate. Chronicity:  Chronic Context:  Chronic kidney disease.  No symptoms currently. Relieved by:  Has been improved by Kayexalate in the past Worsened by:  Nothing Ineffective treatments:  None tried Associated symptoms: no abdominal pain, no chest pain, no cough, no ear pain, no fever, no loss of consciousness, no nausea, no rash, no shortness of breath, no sore throat and no vomiting     Past Medical History:  Diagnosis Date  . Abnormal Pap smear   . Allergy    seasonal allergies  . Anemia   . Chronic kidney disease   . Diabetes mellitus    diagnosed age 71  . Diabetes mellitus without complication ([place])   . Glaucoma   . Hyperlipidemia   . Hypertension   . Migraines   . Noncompliance   . Numbness in left leg     Patient Active Problem List   Diagnosis Date Noted  . Migraine without aura and without status migrainosus, not intractable 06/22/2019  . CKD (chronic kidney disease) stage 4, GFR 15-29 ml/min ([facility]) 12/04/2018  . Chest pain 11/04/2018  . Hyperkalemia 11/04/2018  . Diabetic retinopathy ([place]) 04/26/2017  . Proteinuria 11/01/2015  . Normocytic anemia 09/07/2011  . DM (diabetes mellitus), type 1, uncontrolled, with renal complications ([place]) 52/77/8242  . Hyperlipidemia 09/07/2011  .  Hypertension 09/07/2011    Past Surgical History:  Procedure Laterality Date  . WISDOM TOOTH EXTRACTION       OB History   No obstetric history on file.      Home Medications    Prior to Admission medications   Medication Sig Start Date End Date Taking? Authorizing Provider  amLODipine (NORVASC) 5 MG tablet Take 1 tablet (5 mg total) by mouth daily. 05/27/18   Ladell Pier, MD  atorvastatin (LIPITOR) 10 MG tablet Take 1 tablet (10 mg total) by mouth daily. Patient taking differently: Take 20 mg by mouth daily.  05/27/18   Ladell Pier, MD  Blood Glucose Monitoring Suppl (TRUE METRIX METER) w/Device KIT 37 Units/day by Does not apply route 4 (four) times daily. 10/18/15   Brayton Caves, PA-C  carvedilol (COREG) 3.125 MG tablet TAKE 1 TABLET (3.125 MG TOTAL) BY MOUTH 2 (TWO) TIMES DAILY WITH A MEAL. 07/21/19   Ladell Pier, MD  fluticasone (FLONASE) 50 MCG/ACT nasal spray Place 2 sprays into both nostrils daily. 05/27/18   Ladell Pier, MD  gabapentin (NEURONTIN) 300 MG capsule TAKE 1 CAPSULE (300 MG TOTAL) BY MOUTH 2 (TWO) TIMES DAILY. Patient not taking: Reported on 06/22/2019 01/27/19   Ladell Pier, MD  glucose blood (TRUE METRIX BLOOD GLUCOSE TEST) test strip Use as instructed 03/30/17   Ladell Pier, MD  glucose blood (TRUE METRIX BLOOD GLUCOSE TEST) test strip Use as instructed 10/30/18   Wynetta Emery,  Dalbert Batman, MD  insulin NPH Human (HUMULIN N,NOVOLIN N) 100 UNIT/ML injection 41 units in a.m subcut and 30 units in p.m Patient taking differently: Inject 30-41 Units into the skin See admin instructions. 41 units in a.m subcut and 30 units in p.m. 06/17/17   Ladell Pier, MD  sodium bicarbonate 650 MG tablet Take 1 tablet (650 mg total) by mouth 2 (two) times daily. 12/23/18   Noralee Space, MD  TRUEPLUS LANCETS 28G MISC 37 Units by Does not apply route 4 (four) times daily. 10/18/15   Brayton Caves, PA-C  Vitamin D, Ergocalciferol, (DRISDOL) 1.25 MG  (50000 UT) CAPS capsule Take 1 capsule (50,000 Units total) by mouth every 7 (seven) days. 10/15/18   Ladell Pier, MD    Family History Family History  Problem Relation Age of Onset  . Cancer Maternal Grandfather        prostate  . Hypertension Mother   . Stroke Mother   . Cancer Other   . COPD Other   . Hyperlipidemia Other   . Hypertension Father     Social History Social History   Tobacco Use  . Smoking status: Never Smoker  . Smokeless tobacco: Never Used  Substance Use Topics  . Alcohol use: Yes    Comment: occasional   . Drug use: No     Allergies   Garlic   Review of Systems Review of Systems  Constitutional: Negative for chills and fever.  HENT: Negative for ear pain and sore throat.   Eyes: Negative for pain and visual disturbance.  Respiratory: Negative for cough and shortness of breath.   Cardiovascular: Negative for chest pain and palpitations.  Gastrointestinal: Negative for abdominal pain, nausea and vomiting.  Genitourinary: Negative for dysuria and hematuria.  Musculoskeletal: Negative for arthralgias and back pain.  Skin: Negative for color change and rash.  Neurological: Negative for seizures, loss of consciousness and syncope.  All other systems reviewed and are negative.    Physical Exam Updated Vital Signs BP (!) 143/87   Pulse 80   Temp 98 F (36.7 C) (Oral)   Resp 14   LMP 08/08/2019   SpO2 100%   Physical Exam Vitals signs and nursing note reviewed.  Constitutional:      General: She is not in acute distress.    Appearance: She is well-developed.  HENT:     Head: Normocephalic and atraumatic.  Eyes:     Conjunctiva/sclera: Conjunctivae normal.  Neck:     Musculoskeletal: Neck supple.  Cardiovascular:     Rate and Rhythm: Normal rate and regular rhythm.     Heart sounds: No murmur.  Pulmonary:     Effort: Pulmonary effort is normal. No respiratory distress.     Breath sounds: Normal breath sounds.  Abdominal:      Palpations: Abdomen is soft.     Tenderness: There is no abdominal tenderness.  Skin:    General: Skin is warm and dry.  Neurological:     General: No focal deficit present.     Mental Status: She is alert.  Psychiatric:        Mood and Affect: Mood normal.      ED Treatments / Results  Labs (all labs ordered are listed, but only abnormal results are displayed) Labs Reviewed  BASIC METABOLIC PANEL - Abnormal; Notable for the following components:      Result Value   Potassium 5.4 (*)    CO2 19 (*)  Glucose, Bld 300 (*)    BUN 55 (*)    Creatinine, Ser 4.65 (*)    GFR calc non Af Amer 11 (*)    GFR calc Af Amer 13 (*)    All other components within normal limits    EKG EKG Interpretation  Date/Time:  Saturday August 15 2019 09:35:20 EST Ventricular Rate:  81 PR Interval:  140 QRS Duration: 72 QT Interval:  358 QTC Calculation: 415 R Axis:   73 Text Interpretation: Normal sinus rhythm Normal ECG since last tracing no significant change Confirmed by Noemi Chapel [phone]) on 08/15/2019 9:38:29 AM   Radiology No results found.  Procedures Procedures (including critical care time)  Medications Ordered in ED Medications  sodium polystyrene (KAYEXALATE) 15 GM/60ML suspension 30 g (has no administration in time range)     Initial Impression / Assessment and Plan / ED Course  I have reviewed the triage vital signs and the nursing notes.  Pertinent labs & imaging results that were available during my care of the patient were reviewed by me and considered in my medical decision making (see chart for details).        Patient is a 38 year old female with history and physical exam as above presents emergency department for evaluation of hyperkalemia and slightly elevated creatinine in the setting of her chronic kidney disease.  This is found at a asymptomatic nephrologist appointment yesterday and she was instructed to come to the emergency department for further  evaluation and treatment.  Patient is entirely asymptomatic at this time.  EKG was obtained which demonstrated no emergent findings and no findings concerning for hyperkalemia impacting the cardiac cycle.  Labs demonstrated potassium 5.4, creatinine 4.65.  Creatinine is similar to her most recent value.  Potassium is lower than the 6.0 that it was earlier yesterday.  At this time patient is hemodynamically stable and stable for discharge after Kayexalate administration.  Patient has taken this medication multiple times and is related with its effects.  Patient was given my usual customary discussion regarding hyperkalemia as well as elevated creatinine including anticipatory guidance and strict return precautions.  She verbalized understanding.  She will follow up closely with her primary care nephrologist and primary care physician as an outpatient.  I recommend she contact them on Monday to schedule an appointment.   Final Clinical Impressions(s) / ED Diagnoses   Final diagnoses:  Hyperkalemia    ED Discharge Orders    None       Romona Curls, MD 08/15/19 1131    Noemi Chapel, MD 08/16/19 [phone]

## 2019-08-17 MED FILL — SODIUM BICARBONATE 650 MG T: 650 | 30 days supply | Qty: 120 | Fill #0

## 2019-08-20 MED FILL — ?AMLODIPINE BESYLATE 5 MG T: 5 MG | 30 days supply | Qty: 60 | Fill #2

## 2019-08-20 MED FILL — ?CARVEDILOL 3.125 MG TABLET: 3.125 | 30 days supply | Qty: 60 | Fill #1

## 2019-08-20 MED FILL — LATANOPROST 0.005% EYE DRP: 0.005 | 18 days supply | Qty: 3 | Fill #1

## 2019-08-20 MED FILL — CALCITRIOL 0.25 MCG CAPS: 0.25 | 30 days supply | Qty: 12 | Fill #2

## 2019-09-10 ENCOUNTER — Ambulatory Visit: Payer: Self-pay | Attending: Internal Medicine | Admitting: Internal Medicine

## 2019-09-10 ENCOUNTER — Other Ambulatory Visit: Payer: Self-pay

## 2019-09-10 DIAGNOSIS — E108 Type 1 diabetes mellitus with unspecified complications: Secondary | ICD-10-CM

## 2019-09-10 DIAGNOSIS — E1022 Type 1 diabetes mellitus with diabetic chronic kidney disease: Secondary | ICD-10-CM

## 2019-09-10 DIAGNOSIS — I10 Essential (primary) hypertension: Secondary | ICD-10-CM

## 2019-09-10 DIAGNOSIS — N185 Chronic kidney disease, stage 5: Secondary | ICD-10-CM

## 2019-09-10 NOTE — Progress Notes (Signed)
Pt states her blood sugar this morning was 128  Pt states she just checked it again and it was 118

## 2019-09-13 DIAGNOSIS — E1022 Type 1 diabetes mellitus with diabetic chronic kidney disease: Secondary | ICD-10-CM | POA: Insufficient documentation

## 2019-09-13 NOTE — Progress Notes (Signed)
Virtual Visit via Telephone Note Due to current restrictions/limitations of in-office visits due to the COVID-19 pandemic, this scheduled clinical appointment was converted to a telehealth visit  I connected with Cathy Green on 09/11/2019 at 5:21 p.m by telephone and verified that I am speaking with the correct person using two identifiers. I am in my office.  The patient is at home.  Only the patient and myself participated in this encounter.  I discussed the limitations, risks, security and privacy concerns of performing an evaluation and management service by telephone and the availability of in person appointments. I also discussed with the patient that there may be a patient responsible charge related to this service. The patient expressed understanding and agreed to proceed.   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 History of CKD stage 4-5, ACD, HTNand vitamin D deficiency.  CKD: since last visit, she reapplied for Medicaid but was again approved for only family planning medicaid.  The pt is unable to get on the transplant list with family planning medicaid.  Working with [employer]  HTN: checks BP daily.  Some of her latest readings are 130/86, 131/78, 127/83, 137/77 and 127/77.  Compliant with Norvasc and Coreg Limits salt in foods No LE edema at this time.  No CP/SOB  DM:  Doing well with eating habits. Checks BS twice a day. Few readings have been high.  BS this a.m was 128 and this afternoon 118.  Avg morning range 100-130s and 90-120 in the afternoons. Compliant with Humulin N  Outpatient Encounter Medications as of 09/10/2019  Medication Sig  . amLODipine (NORVASC) 5 MG tablet Take 1 tablet (5 mg total) by mouth daily.  Marland Kitchen atorvastatin (LIPITOR) 10 MG tablet Take 1 tablet (10 mg total) by mouth daily. (Patient taking differently: Take 20 mg by mouth daily. )  . Blood Glucose Monitoring Suppl (TRUE METRIX METER)  w/Device KIT 37 Units/day by Does not apply route 4 (four) times daily.  . carvedilol (COREG) 3.125 MG tablet TAKE 1 TABLET (3.125 MG TOTAL) BY MOUTH 2 (TWO) TIMES DAILY WITH A MEAL.  . fluticasone (FLONASE) 50 MCG/ACT nasal spray Place 2 sprays into both nostrils daily.  Marland Kitchen gabapentin (NEURONTIN) 300 MG capsule TAKE 1 CAPSULE (300 MG TOTAL) BY MOUTH 2 (TWO) TIMES DAILY. (Patient not taking: Reported on 06/22/2019)  . glucose blood (TRUE METRIX BLOOD GLUCOSE TEST) test strip Use as instructed  . glucose blood (TRUE METRIX BLOOD GLUCOSE TEST) test strip Use as instructed  . insulin NPH Human (HUMULIN N,NOVOLIN N) 100 UNIT/ML injection 41 units in a.m subcut and 30 units in p.m (Patient taking differently: Inject 30-41 Units into the skin See admin instructions. 41 units in a.m subcut and 30 units in p.m.)  . sodium bicarbonate 650 MG tablet Take 1 tablet (650 mg total) by mouth 2 (two) times daily.  . TRUEPLUS LANCETS 28G MISC 37 Units by Does not apply route 4 (four) times daily.  . Vitamin D, Ergocalciferol, (DRISDOL) 1.25 MG (50000 UT) CAPS capsule Take 1 capsule (50,000 Units total) by mouth every 7 (seven) days.   No facility-administered encounter medications on file as of 09/10/2019.      Observations/Objective: Results for orders placed or performed during the hospital encounter of 16/10/96  Basic metabolic panel  Result Value Ref Range   Sodium 136 135 - 145 mmol/L   Potassium 5.4 (H) 3.5 - 5.1 mmol/L   Chloride 107 98 -  111 mmol/L   CO2 19 (L) 22 - 32 mmol/L   Glucose, Bld 300 (H) 70 - 99 mg/dL   BUN 55 (H) 6 - 20 mg/dL   Creatinine, Ser 4.65 (H) 0.44 - 1.00 mg/dL   Calcium 9.2 8.9 - 10.3 mg/dL   GFR calc non Af Amer 11 (L) >60 mL/min   GFR calc Af Amer 13 (L) >60 mL/min   Anion gap 10 5 - 15     Assessment and Plan: 1. Diabetes mellitus type 1, controlled, with complications ([place]) -continue current insulin dose and healthy eating habits -she is up to date with eye  exam  2. 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  I will write letter in support of her getting regular medicaid  3. Essential hypertension Most readings close to goal.  Continue Coreg and Norvasc.  Does not tolerate higher dose of Norvasc   Follow Up Instructions: 4 mths   I discussed the assessment and treatment plan with the patient. The patient was provided an opportunity to ask questions and all were answered. The patient agreed with the plan and demonstrated an understanding of the instructions.   The patient was advised to call back or seek an in-person evaluation if the symptoms worsen or if the condition fails to improve as anticipated.  I provided 6 minutes of non-face-to-face time during this encounter.   Karle Plumber, MD

## 2019-09-15 ENCOUNTER — Telehealth: Payer: Self-pay

## 2019-09-15 NOTE — Telephone Encounter (Signed)
As per [place] Ses[place]/:[employer] [facility] , she completed a medicaid application with the patient over the phone on 07/02/2019 and she sent the application to the patient via email so the patient could sign and return to [employer].  They have followed up with the patient by phone and email about her signature but have not heard back.  This CM placed a call to the patient # [phone], message left with call back requested.

## 2019-09-17 ENCOUNTER — Other Ambulatory Visit: Payer: Self-pay | Admitting: Internal Medicine

## 2019-09-17 MED FILL — AMLODIPINE BESYLATE 5 MG TA: 5 | 30 days supply | Qty: 60 | Fill #3

## 2019-09-17 MED FILL — ?ATORVASTATIN 10 MG TABLET: 10 | 30 days supply | Qty: 30 | Fill #0

## 2019-09-17 MED FILL — ?CARVEDILOL 3.125 MG TABLET: 3.125 | 30 days supply | Qty: 60 | Fill #2

## 2019-09-17 MED FILL — CALCITRIOL 0.25 MCG CAPS: 0.25 | 30 days supply | Qty: 12 | Fill #3

## 2019-09-17 MED FILL — LATANOPROST 0.005% EYE DRP: 0.005 | 18 days supply | Qty: 3 | Fill #2

## 2019-09-17 MED FILL — SODIUM BICARBONATE 650 MG T: 650 | 30 days supply | Qty: 120 | Fill #1

## 2019-10-09 ENCOUNTER — Telehealth: Payer: Self-pay

## 2019-10-09 NOTE — Telephone Encounter (Signed)
Call placed to patient regarding medicaid.  She said that she was approved for family planning medicaid again at the beginning of December. She said that she was expecting an email from [place] with [employer] of [place] and did not receive one and did not follow up with her. This CM provided her with the phone number for [employer] and encouraged her to contact Amina to discuss her medicaid status and need to sign application

## 2019-10-15 ENCOUNTER — Other Ambulatory Visit: Payer: Self-pay | Admitting: Internal Medicine

## 2019-10-15 NOTE — Telephone Encounter (Signed)
Could you refill if appropriate

## 2019-10-16 MED ORDER — ATORVASTATIN CALCIUM 10 MG PO TABS: 10.0000 mg | ORAL_TABLET | Freq: Every day | ORAL | 2 refills | Status: DC

## 2019-10-16 MED FILL — LATANOPROST 0.005% EYE DRP: 0.005 | 18 days supply | Qty: 3 | Fill #3

## 2019-10-16 MED FILL — ?ATORVASTATIN 10 MG TABLET: 10 | 30 days supply | Qty: 30 | Fill #0

## 2019-10-21 ENCOUNTER — Other Ambulatory Visit: Payer: Self-pay | Admitting: Internal Medicine

## 2019-10-21 DIAGNOSIS — I10 Essential (primary) hypertension: Secondary | ICD-10-CM

## 2019-10-21 MED FILL — AMLODIPINE BESYLATE 5 MG TA: 5 | 30 days supply | Qty: 60 | Fill #4

## 2019-10-21 MED FILL — SODIUM BICARBONATE 650 MG T: 650 | 30 days supply | Qty: 120 | Fill #2

## 2019-10-23 MED FILL — ?CARVEDILOL 3.125 MG TABLET: 3.125 | 30 days supply | Qty: 60 | Fill #0

## 2019-10-23 MED FILL — CALCITRIOL 0.25 MCG CAPS: 0.25 | 28 days supply | Qty: 12 | Fill #0

## 2019-11-17 MED FILL — SODIUM BICARBONATE 650 MG T: 650 | 30 days supply | Qty: 120 | Fill #3

## 2019-11-17 MED FILL — CALCITRIOL 0.25 MCG CAPS: 0.25 | 28 days supply | Qty: 12 | Fill #1

## 2019-11-17 MED FILL — AMLODIPINE BESYLATE 5 MG TA: 5 | 30 days supply | Qty: 60 | Fill #5

## 2019-11-17 MED FILL — LATANOPROST 0.005% EYE DRP: 0.005 | 18 days supply | Qty: 3 | Fill #4

## 2019-11-17 MED FILL — ?CARVEDILOL 3.125 MG TABLET: 3.125 | 30 days supply | Qty: 60 | Fill #1

## 2019-11-17 MED FILL — ?ATORVASTATIN 10 MG TABLET: 10 | 30 days supply | Qty: 30 | Fill #1

## 2019-11-30 ENCOUNTER — Encounter: Payer: Self-pay | Admitting: Internal Medicine

## 2019-11-30 MED ORDER — FLUTICASONE PROPIONATE 50 MCG/ACT NA SUSP: 2.0000 | Freq: Every day | NASAL | 6 refills | Status: DC

## 2019-11-30 MED ORDER — INSULIN NPH (HUMAN) (ISOPHANE) 100 UNIT/ML ~~LOC~~ SUSP: SUBCUTANEOUS | 5 refills | Status: DC

## 2019-11-30 MED ORDER — TRUE METRIX BLOOD GLUCOSE TEST VI STRP: ORAL_STRIP | 12 refills | Status: DC

## 2019-11-30 MED FILL — TRUE METRIX TEST STRIP: 25 days supply | Qty: 100 | Fill #0

## 2019-11-30 MED FILL — FLUTICASONE PROP 50 MCG SPR: 50 | 30 days supply | Qty: 16 | Fill #0

## 2019-11-30 MED FILL — ?HUMULIN N 100 UNITS/ML VIA: 100 | 28 days supply | Qty: 20 | Fill #0

## 2019-12-01 ENCOUNTER — Other Ambulatory Visit: Payer: Self-pay | Admitting: Internal Medicine

## 2019-12-01 DIAGNOSIS — E1042 Type 1 diabetes mellitus with diabetic polyneuropathy: Secondary | ICD-10-CM

## 2019-12-02 MED FILL — GABAPENTIN 300 MG CAPSULE: 300 | 30 days supply | Qty: 60 | Fill #0

## 2019-12-17 MED FILL — AMLODIPINE BESYLATE 5 MG TA: 5 | 30 days supply | Qty: 60 | Fill #0

## 2019-12-17 MED FILL — LATANOPROST 0.005% EYE DRP: 0.005 | 18 days supply | Qty: 3 | Fill #5

## 2019-12-17 MED FILL — SODIUM BICARBONATE 650 MG T: 650 | 30 days supply | Qty: 120 | Fill #4

## 2019-12-17 MED FILL — ?ATORVASTATIN CALCIUM 10MG: 10 | 30 days supply | Qty: 30 | Fill #2

## 2019-12-17 MED FILL — ?CARVEDILOL 3.125 MG TABLET: 3.125 | 30 days supply | Qty: 60 | Fill #2

## 2019-12-17 MED FILL — CALCITRIOL 0.25 MCG CAPS: 0.25 | 28 days supply | Qty: 12 | Fill #2

## 2019-12-30 ENCOUNTER — Telehealth: Payer: Self-pay

## 2019-12-30 NOTE — Telephone Encounter (Signed)
Message received from [place] [facility] [place] noting that she has still not received a signed medicaid application for the patient.

## 2020-01-13 ENCOUNTER — Other Ambulatory Visit: Payer: Self-pay | Admitting: Internal Medicine

## 2020-01-13 DIAGNOSIS — I10 Essential (primary) hypertension: Secondary | ICD-10-CM

## 2020-01-13 MED FILL — FLUTICASONE PROP 50 MCG SPR: 50 | 30 days supply | Qty: 16 | Fill #1

## 2020-01-13 MED FILL — LATANOPROST 0.005% EYE DRP: 0.005 | 18 days supply | Qty: 3 | Fill #6

## 2020-01-13 MED FILL — CALCITRIOL 0.25 MCG CAPS: 0.25 | 28 days supply | Qty: 12 | Fill #3

## 2020-01-13 MED FILL — AMLODIPINE BESYLATE 5 MG TA: 5 | 30 days supply | Qty: 60 | Fill #1

## 2020-01-13 MED FILL — GABAPENTIN 300 MG CAPSULE: 300 | 30 days supply | Qty: 60 | Fill #1

## 2020-01-13 MED FILL — SODIUM BICARBONATE 650 MG T: 650 | 30 days supply | Qty: 120 | Fill #5

## 2020-02-12 ENCOUNTER — Other Ambulatory Visit: Payer: Self-pay

## 2020-02-12 ENCOUNTER — Encounter: Payer: Self-pay | Admitting: Internal Medicine

## 2020-02-12 ENCOUNTER — Ambulatory Visit: Payer: BLUE CROSS/BLUE SHIELD | Attending: Internal Medicine | Admitting: Internal Medicine

## 2020-02-12 VITALS — BP 138/81 | HR 91 | Ht 61.0 in | Wt 171.8 lb

## 2020-02-12 DIAGNOSIS — E103413 Type 1 diabetes mellitus with severe nonproliferative diabetic retinopathy with macular edema, bilateral: Secondary | ICD-10-CM

## 2020-02-12 DIAGNOSIS — I10 Essential (primary) hypertension: Secondary | ICD-10-CM

## 2020-02-12 DIAGNOSIS — T383X5A Adverse effect of insulin and oral hypoglycemic [antidiabetic] drugs, initial encounter: Secondary | ICD-10-CM

## 2020-02-12 DIAGNOSIS — E160 Drug-induced hypoglycemia without coma: Secondary | ICD-10-CM | POA: Insufficient documentation

## 2020-02-12 DIAGNOSIS — N185 Chronic kidney disease, stage 5: Secondary | ICD-10-CM

## 2020-02-12 DIAGNOSIS — E1022 Type 1 diabetes mellitus with diabetic chronic kidney disease: Secondary | ICD-10-CM

## 2020-02-12 LAB — POCT GLYCOSYLATED HEMOGLOBIN (HGB A1C): HbA1c, POC (controlled diabetic range): 6.2 % (ref 0.0–7.0)

## 2020-02-12 LAB — GLUCOSE, POCT (MANUAL RESULT ENTRY)
POC Glucose: 58 mg/dl — AB (ref 70–99)
POC Glucose: 101 mg/dl — AB (ref 70–99)

## 2020-02-12 MED ORDER — FREESTYLE LIBRE READER DEVI: 1.0000 | Freq: Once | 0 refills | Status: AC

## 2020-02-12 MED ORDER — FREESTYLE LIBRE SENSOR SYSTEM MISC: 12 refills | Status: DC

## 2020-02-12 MED ORDER — INSULIN NPH (HUMAN) (ISOPHANE) 100 UNIT/ML ~~LOC~~ SUSP: SUBCUTANEOUS | 5 refills | Status: DC

## 2020-02-12 NOTE — Patient Instructions (Addendum)
Stop Carvedilol. Decrease NPH insulin to 41 units in mornings and 26 units in evening.

## 2020-02-12 NOTE — Progress Notes (Signed)
Patient ID: Cathy Green, female    DOB: 1981-08-30  MRN: [id number]  CC: Diabetes   Subjective: Cathy Green is a 39 y.o. female who presents for chronic ds management Her concerns today include:  Hx Dm type 1 (dx at age 89) with nephropathy, retinopathy and neuropathy. History of CKD stage5,ACD,HTNand vitamin D deficiency.  Now has [employer] of [place]. CKD stage 5:  She has been referred for kidney transplant eval. Has appt 03/2020 for this.    HTN:  Out of Coreg x 2 wks.  Still has Norvasc. Checks BP daily. Recent readings without Coreg were 128/80, 135/78, 114/74.  Uses arm cuff Limits salt in foods.   No SOB. Some CP if she takes a deep breath.  No cough or fever.  No LE edema  DIABETES TYPE 2 Last A1C:   Results for orders placed or performed in visit on 02/12/20  POCT glucose (manual entry)  Result Value Ref Range   POC Glucose 58 (A) 70 - 99 mg/dl  POCT glycosylated hemoglobin (Hb A1C)  Result Value Ref Range   Hemoglobin A1C     HbA1c POC (<> result, manual entry)     HbA1c, POC (prediabetic range)     HbA1c, POC (controlled diabetic range) 6.2 0.0 - 7.0 %  POCT glucose (manual entry)  Result Value Ref Range   POC Glucose 101 (A) 70 - 99 mg/dl    Med Adherence:  '[x]'  Yes - insulin NPH 41 a.m and 30 in p.m    '[]'  No Medication side effects:  '[]'  Yes    '[x]'  No Home Monitoring?  '[x]'  Yes - checks BID before meals    '[]'  No Home glucose results range: highest was 135 Diet Adherence: '[x]'  Yes -not much starches, more veggies Exercise: not as much as before but plans to start again due to wgh gain Hypoglycemic episodes?: '[x]'  Yes - about 3 episodes in the past 2 wks.  Can feel when BS low.  Lowest has been 48.  One happen in middle of night, other 2 were in the late mornings, one at work.  Numbness of the feet? '[x]'  Yes    '[]'  No Retinopathy hx? '[x]'  Yes    '[]'  No Last eye exam: 01/26/20 Comments:   HM:  Had COVID-19 [place] 12/05/2019 nd 12/26/19  Patient Active Problem List   Diagnosis Date Noted  . 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 Migraine without aura and without status migrainosus, not intractable 06/22/2019  . Chest pain 11/04/2018  . Hyperkalemia 11/04/2018  . Diabetic retinopathy ([place]) 04/26/2017  . Proteinuria 11/01/2015  . Normocytic anemia 09/07/2011  . Hyperlipidemia 09/07/2011  . Hypertension 09/07/2011     Current Outpatient Medications on File Prior to Visit  Medication Sig Dispense Refill  . amLODipine (NORVASC) 5 MG tablet Take 1 tablet (5 mg total) by mouth daily. 30 tablet 6  . atorvastatin (LIPITOR) 10 MG tablet Take 1 tablet (10 mg total) by mouth daily. 30 tablet 2  . Blood Glucose Monitoring Suppl (TRUE METRIX METER) w/Device KIT 37 Units/day by Does not apply route 4 (four) times daily. 100 kit 3  . fluticasone (FLONASE) 50 MCG/ACT nasal spray Place 2 sprays into both nostrils daily. 16 g 6  . gabapentin (NEURONTIN) 300 MG capsule TAKE 1 CAPSULE (300 MG TOTAL) BY MOUTH 2 (TWO) TIMES DAILY. 60 capsule 1  . glucose blood (TRUE METRIX BLOOD GLUCOSE TEST) test strip Use as instructed 100  each 12  . glucose blood (TRUE METRIX BLOOD GLUCOSE TEST) test strip Use as instructed 100 each 12  . sodium bicarbonate 650 MG tablet Take 1 tablet (650 mg total) by mouth 2 (two) times daily. 60 tablet 5  . TRUEPLUS LANCETS 28G MISC 37 Units by Does not apply route 4 (four) times daily. 100 each 3  . Vitamin D, Ergocalciferol, (DRISDOL) 1.25 MG (50000 UT) CAPS capsule Take 1 capsule (50,000 Units total) by mouth every 7 (seven) days. (Patient not taking: Reported on 02/12/2020) 4 capsule 0   No current facility-administered medications on file prior to visit.    Allergies  Allergen Reactions  . Garlic Nausea And Vomiting    Social History   Socioeconomic History  . Marital status: Single    Spouse name: Not on file  . Number of children: Not on file  . Years of education: Not on file  . Highest education level: Not  on file  Occupational History  . Not on file  Tobacco Use  . Smoking status: Never Smoker  . Smokeless tobacco: Never Used  Substance and Sexual Activity  . Alcohol use: Yes    Comment: occasional   . Drug use: No  . Sexual activity: Never    Birth control/protection: Pill  Other Topics Concern  . Not on file  Social History Narrative   ** Merged History Encounter **       Social Determinants of Health   Financial Resource Strain:   . Difficulty of Paying Living Expenses:   Food Insecurity:   . Worried About Charity fundraiser in the Last Year:   . Arboriculturist in the Last Year:   Transportation Needs:   . Film/video editor (Medical):   Marland Kitchen Lack of Transportation (Non-Medical):   Physical Activity:   . Days of Exercise per Week:   . Minutes of Exercise per Session:   Stress:   . Feeling of Stress :   Social Connections:   . Frequency of Communication with Friends and Family:   . Frequency of Social Gatherings with Friends and Family:   . Attends Religious Services:   . Active Member of Clubs or Organizations:   . Attends Archivist Meetings:   Marland Kitchen Marital Status:   Intimate Partner Violence:   . Fear of Current or Ex-Partner:   . Emotionally Abused:   Marland Kitchen Physically Abused:   . Sexually Abused:     Family History  Problem Relation Age of Onset  . Cancer Maternal Grandfather        prostate  . Hypertension Mother   . Stroke Mother   . Cancer Other   . COPD Other   . Hyperlipidemia Other   . Hypertension Father     Past Surgical History:  Procedure Laterality Date  . WISDOM TOOTH EXTRACTION      ROS: Review of Systems Negative except as stated above  PHYSICAL EXAM: BP 138/81   Pulse 91   Ht '5\' 1"'  (1.549 m)   Wt 171 lb 12.8 oz (77.9 kg)   SpO2 98%   BMI 32.46 kg/m   Wt Readings from Last 3 Encounters:  02/12/20 171 lb 12.8 oz (77.9 kg)  06/22/19 164 lb (74.4 kg)  12/04/18 156 lb 12.8 oz (71.1 kg)   BP 150/80 Physical  Exam  General appearance - alert, well appearing, and in no distress Mental status - normal mood, behavior, speech, dress, motor activity, and thought processes Neck -  supple, no significant adenopathy Chest - clear to auscultation, no wheezes, rales or rhonchi, symmetric air entry Heart - normal rate, regular rhythm, normal S1, S2, no murmurs, rubs, clicks or gallops Extremities - peripheral pulses normal, no pedal edema, no clubbing or cyanosis Diabetic Foot Exam - Simple   Simple Foot Form Visual Inspection No deformities, no ulcerations, no other skin breakdown bilaterally: Yes Sensation Testing Intact to touch and monofilament testing bilaterally: Yes Pulse Check Posterior Tibialis and Dorsalis pulse intact bilaterally: Yes Comments      CMP Latest Ref Rng & Units 08/15/2019 01/26/2019 01/06/2019  Glucose 70 - 99 mg/dL 300(H) 126(H) 120(H)  BUN 6 - 20 mg/dL 55(H) 33(H) 40(H)  Creatinine 0.44 - 1.00 mg/dL 4.65(H) 3.80(H) 3.75(H)  Sodium 135 - 145 mmol/L 136 140 139  Potassium 3.5 - 5.1 mmol/L 5.4(H) 5.0 4.6  Chloride 98 - 111 mmol/L 107 110(H) 105  CO2 22 - 32 mmol/L 19(L) 20 20  Calcium 8.9 - 10.3 mg/dL 9.2 9.5 9.4  Total Protein 6.0 - 8.5 g/dL - - -  Total Bilirubin 0.0 - 1.2 mg/dL - - -  Alkaline Phos 39 - 117 IU/L - - -  AST 0 - 40 IU/L - - -  ALT 0 - 32 IU/L - - -   Lipid Panel     Component Value Date/Time   CHOL 221 (H) 11/04/2018 0600   CHOL 227 (H) 05/27/2018 1602   TRIG 101 11/04/2018 0600   HDL 48 11/04/2018 0600   HDL 59 05/27/2018 1602   CHOLHDL 4.6 11/04/2018 0600   VLDL 20 11/04/2018 0600   LDLCALC 153 (H) 11/04/2018 0600   LDLCALC 140 (H) 05/27/2018 1602    CBC    Component Value Date/Time   WBC 5.0 11/12/2018 1030   WBC 4.4 11/05/2018 0510   RBC 3.37 (L) 11/12/2018 1030   RBC 3.01 (L) 11/05/2018 0510   RBC 3.01 (L) 11/05/2018 0510   HGB 8.9 (L) 11/12/2018 1030   HGB 11.6 09/07/2011 1500   HCT 28.3 (L) 11/12/2018 1030   HCT 34.7 (L)  09/07/2011 1500   PLT 217 11/12/2018 1030   MCV 84 11/12/2018 1030   MCV 79.4 (L) 09/07/2011 1500   MCH 26.4 (L) 11/12/2018 1030   MCH 27.6 11/05/2018 0510   MCHC 31.4 (L) 11/12/2018 1030   MCHC 31.6 11/05/2018 0510   RDW 12.9 11/12/2018 1030   RDW 13.8 09/07/2011 1500   LYMPHSABS 1.6 11/12/2018 1030   LYMPHSABS 1.7 09/07/2011 1500   MONOABS 0.3 05/31/2018 1005   MONOABS 0.2 09/07/2011 1500   EOSABS 0.1 11/12/2018 1030   BASOSABS 0.0 11/12/2018 1030   BASOSABS 0.0 09/07/2011 1500    ASSESSMENT AND PLAN: 1. 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  She plans to start walking again for exercise. Given the hypoglycemic episodes, I recommend decreasing the evening dose of NPH insulin to 26 units - insulin NPH Human (NOVOLIN N) 100 UNIT/ML injection; 41 units in a.m subcut and 26 units in p.m  Dispense: 20 mL; Refill: 5 - Continuous Blood Gluc Receiver (FREESTYLE LIBRE READER) DEVI; 1 Device by Does not apply route once for 1 dose.  Dispense: 1 each; Refill: 0 - Continuous Blood Gluc Sensor (FREESTYLE LIBRE SENSOR SYSTEM) MISC; Change sensor Q 2 wks  Dispense: 2 each; Refill: 12 - POCT glucose (manual entry)  2. 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  Plan is to try to get her on the transplant list  3. Essential hypertension Not at goal today but home blood pressure readings have been good without the carvedilol.  So I have discontinued that and she will continue on the amlodipine  4. Hypoglycemia due to insulin See #1 above     Patient was given the opportunity to ask questions.  Patient verbalized understanding of the plan and was able to repeat key elements of the plan.   Orders Placed This Encounter  Procedures  . POCT glucose (manual entry)  . POCT glycosylated hemoglobin (Hb A1C)  . POCT glucose (manual entry)     Requested  Prescriptions   Signed Prescriptions Disp Refills  . insulin NPH Human (NOVOLIN N) 100 UNIT/ML injection 20 mL 5    Sig: 41 units in a.m subcut and 26 units in p.m  . Continuous Blood Gluc Receiver (FREESTYLE LIBRE READER) DEVI 1 each 0    Sig: 1 Device by Does not apply route once for 1 dose.  . Continuous Blood Gluc Sensor (FREESTYLE LIBRE SENSOR SYSTEM) MISC 2 each 12    Sig: Change sensor Q 2 wks    Return in about 4 months (around 06/14/2020).  Karle Plumber, MD, FACP

## 2020-02-16 ENCOUNTER — Other Ambulatory Visit: Payer: Self-pay | Admitting: Internal Medicine

## 2020-02-16 DIAGNOSIS — E1042 Type 1 diabetes mellitus with diabetic polyneuropathy: Secondary | ICD-10-CM

## 2020-02-16 MED FILL — AMLODIPINE BESYLATE 5 MG TA: 5 | 30 days supply | Qty: 60 | Fill #2

## 2020-02-16 MED FILL — FLUTICASONE PROP 50 MCG SPR: 50 | 30 days supply | Qty: 16 | Fill #2

## 2020-02-16 MED FILL — SODIUM BICARBONATE 650 MG T: 650 | 30 days supply | Qty: 120 | Fill #0

## 2020-02-16 MED FILL — LATANOPROST 0.005% EYE DRP: 0.005 | 18 days supply | Qty: 3 | Fill #7

## 2020-02-17 MED FILL — GABAPENTIN 300 MG CAPSULE: 300 | 30 days supply | Qty: 60 | Fill #0

## 2020-02-25 ENCOUNTER — Other Ambulatory Visit: Payer: Self-pay | Admitting: Internal Medicine

## 2020-02-25 ENCOUNTER — Telehealth: Payer: Self-pay

## 2020-02-25 NOTE — Telephone Encounter (Signed)
This CM spoke to [employer] [place].  She still has not been able to reach the patient to discuss the medicaid application.   She confirmed the patient's phone number and address and plans to call the patient again and send her a letter if she is not able to reach her.

## 2020-02-26 MED FILL — CALCITRIOL 0.25 MCG CAPS: 0.25 | 90 days supply | Qty: 90 | Fill #0

## 2020-03-21 MED FILL — LATANOPROST 0.005% EYE DRP: 0.005 | 18 days supply | Qty: 3 | Fill #8

## 2020-03-21 MED FILL — AMLODIPINE BESYLATE 5 MG TA: 5 | 30 days supply | Qty: 60 | Fill #3

## 2020-03-21 MED FILL — SODIUM BICARBONATE 650 MG T: 650 | 30 days supply | Qty: 120 | Fill #1

## 2020-03-21 MED FILL — GABAPENTIN 300 MG CAPSULE: 300 | 30 days supply | Qty: 60 | Fill #1

## 2020-04-22 ENCOUNTER — Other Ambulatory Visit: Payer: Self-pay | Admitting: Internal Medicine

## 2020-04-22 DIAGNOSIS — E1042 Type 1 diabetes mellitus with diabetic polyneuropathy: Secondary | ICD-10-CM

## 2020-04-22 MED FILL — GABAPENTIN 300 MG CAPSULE: 300 | 30 days supply | Qty: 60 | Fill #0

## 2020-04-22 MED FILL — AMLODIPINE BESYLATE 5 MG TA: 5 | 30 days supply | Qty: 60 | Fill #4

## 2020-04-22 MED FILL — SODIUM BICARBONATE 650 MG T: 650 | 30 days supply | Qty: 120 | Fill #2

## 2020-04-22 MED FILL — LATANOPROST 0.005% EYE DRP: 0.005 | 18 days supply | Qty: 3 | Fill #9

## 2020-05-31 MED FILL — GABAPENTIN 300 MG CAPSULE: 300 | 30 days supply | Qty: 60 | Fill #1

## 2020-05-31 MED FILL — CALCITRIOL 0.25 MCG CAPS: 0.25 | 90 days supply | Qty: 90 | Fill #1

## 2020-05-31 MED FILL — SODIUM BICARBONATE 650 MG T: 650 | 30 days supply | Qty: 120 | Fill #3

## 2020-05-31 MED FILL — AMLODIPINE BESYLATE 5 MG TA: 5 | 30 days supply | Qty: 60 | Fill #5

## 2020-06-14 ENCOUNTER — Ambulatory Visit: Payer: BLUE CROSS/BLUE SHIELD | Admitting: Internal Medicine

## 2020-06-14 DIAGNOSIS — E872 Acidosis, unspecified: Secondary | ICD-10-CM | POA: Insufficient documentation

## 2020-06-16 MED FILL — SEVELAMER CARBONATE 800 MG: 800 | 30 days supply | Qty: 60 | Fill #0

## 2020-06-17 MED FILL — LATANOPROST 0.005% EYE DRP: 0.005 | 18 days supply | Qty: 3 | Fill #10

## 2020-06-23 DIAGNOSIS — N186 End stage renal disease: Secondary | ICD-10-CM | POA: Insufficient documentation

## 2020-06-28 DIAGNOSIS — Z4822 Encounter for aftercare following kidney transplant: Secondary | ICD-10-CM | POA: Insufficient documentation

## 2020-06-28 DIAGNOSIS — Z79621 Long term (current) use of calcineurin inhibitor: Secondary | ICD-10-CM | POA: Insufficient documentation

## 2020-06-28 DIAGNOSIS — D849 Immunodeficiency, unspecified: Secondary | ICD-10-CM | POA: Insufficient documentation

## 2020-07-04 ENCOUNTER — Other Ambulatory Visit: Payer: Self-pay | Admitting: Internal Medicine

## 2020-07-04 DIAGNOSIS — E1042 Type 1 diabetes mellitus with diabetic polyneuropathy: Secondary | ICD-10-CM

## 2020-07-04 MED FILL — GABAPENTIN 300 MG CAPSULE: 300 | 30 days supply | Qty: 60 | Fill #0

## 2020-07-04 MED FILL — TRUEPLUS PEN NDL 31G X 1/4: 31G X 6 MM | 25 days supply | Qty: 100 | Fill #0

## 2020-07-04 MED FILL — LANTUS SOLOSTAR 100 UNITS/M: 100 | 41 days supply | Qty: 15 | Fill #0

## 2020-07-04 MED FILL — NOVOLOG FLEXPEN SYRINGE: 100 | 80 days supply | Qty: 12 | Fill #0

## 2020-07-04 MED FILL — SODIUM BICARBONATE 650 MG T: 650 | 30 days supply | Qty: 120 | Fill #4

## 2020-07-18 ENCOUNTER — Encounter: Payer: Self-pay | Admitting: Internal Medicine

## 2020-07-18 ENCOUNTER — Other Ambulatory Visit: Payer: Self-pay | Admitting: Internal Medicine

## 2020-07-18 MED ORDER — TRUE METRIX BLOOD GLUCOSE TEST VI STRP: ORAL_STRIP | 12 refills | Status: DC

## 2020-07-19 ENCOUNTER — Other Ambulatory Visit: Payer: Self-pay | Admitting: Pharmacist

## 2020-07-19 DIAGNOSIS — E103413 Type 1 diabetes mellitus with severe nonproliferative diabetic retinopathy with macular edema, bilateral: Secondary | ICD-10-CM

## 2020-07-19 MED ORDER — MICROLET LANCETS MISC: 6 refills | Status: DC

## 2020-07-19 MED ORDER — CONTOUR NEXT TEST VI STRP: ORAL_STRIP | 6 refills | Status: AC

## 2020-07-19 MED ORDER — CONTOUR NEXT MONITOR W/DEVICE KIT: PACK | 0 refills | Status: DC

## 2020-07-19 MED FILL — MICROLET LANCETS MISC: 33 days supply | Qty: 100 | Fill #0

## 2020-07-19 MED FILL — CONTOUR NEXT STRIPS: 25 days supply | Qty: 100 | Fill #0

## 2020-07-19 MED FILL — CONTOUR NEXT METER: W/DEVICE | 1 days supply | Qty: 1 | Fill #0

## 2020-07-22 ENCOUNTER — Other Ambulatory Visit: Payer: Self-pay | Admitting: Pharmacist

## 2020-07-22 MED ORDER — DEXCOM G6 SENSOR MISC: 2 refills | Status: DC

## 2020-07-22 MED ORDER — DEXCOM G6 RECEIVER DEVI: 0 refills | Status: DC

## 2020-07-22 MED ORDER — DEXCOM G6 TRANSMITTER MISC: 1 refills | Status: DC

## 2020-07-22 MED FILL — DEXCOM G6 TRANSMITTER MISC: 1 days supply | Qty: 1 | Fill #0

## 2020-07-22 MED FILL — DEXCOM G6 RECEIVER DEVI: 1 days supply | Qty: 1 | Fill #0

## 2020-07-22 MED FILL — DEXCOM G6 SENSOR MISC: 30 days supply | Qty: 3 | Fill #0

## 2020-08-01 ENCOUNTER — Other Ambulatory Visit: Payer: Self-pay | Admitting: Ophthalmology

## 2020-08-01 ENCOUNTER — Other Ambulatory Visit: Payer: Self-pay | Admitting: Internal Medicine

## 2020-08-01 DIAGNOSIS — E1042 Type 1 diabetes mellitus with diabetic polyneuropathy: Secondary | ICD-10-CM

## 2020-08-01 MED FILL — GABAPENTIN 300 MG CAPSULE: 300 | 30 days supply | Qty: 60 | Fill #0

## 2020-08-01 MED FILL — TRUEPLUS PEN NDL 31G X 1/4: 31G X 6 MM | 25 days supply | Qty: 100 | Fill #1

## 2020-08-01 MED FILL — SODIUM BICARBONATE 650 MG T: 650 | 30 days supply | Qty: 120 | Fill #5

## 2020-08-01 MED FILL — LATANOPROST 0.005% EYE DRP: 0.005 | 18 days supply | Qty: 3 | Fill #0

## 2020-08-02 ENCOUNTER — Encounter: Payer: Self-pay | Admitting: Internal Medicine

## 2020-08-02 ENCOUNTER — Other Ambulatory Visit: Payer: Self-pay

## 2020-08-02 ENCOUNTER — Other Ambulatory Visit: Payer: Self-pay | Admitting: Internal Medicine

## 2020-08-02 ENCOUNTER — Ambulatory Visit: Payer: MEDICAID | Attending: Internal Medicine | Admitting: Internal Medicine

## 2020-08-02 VITALS — BP 117/81 | HR 99 | Resp 16 | Ht 61.0 in | Wt 166.8 lb

## 2020-08-02 DIAGNOSIS — E1069 Type 1 diabetes mellitus with other specified complication: Secondary | ICD-10-CM

## 2020-08-02 DIAGNOSIS — Z940 Kidney transplant status: Secondary | ICD-10-CM | POA: Diagnosis not present

## 2020-08-02 DIAGNOSIS — I129 Hypertensive chronic kidney disease with stage 1 through stage 4 chronic kidney disease, or unspecified chronic kidney disease: Secondary | ICD-10-CM

## 2020-08-02 DIAGNOSIS — E103413 Type 1 diabetes mellitus with severe nonproliferative diabetic retinopathy with macular edema, bilateral: Secondary | ICD-10-CM | POA: Diagnosis not present

## 2020-08-02 DIAGNOSIS — E785 Hyperlipidemia, unspecified: Secondary | ICD-10-CM

## 2020-08-02 LAB — POCT GLYCOSYLATED HEMOGLOBIN (HGB A1C): HbA1c, POC (controlled diabetic range): 8.1 % — AB (ref 0.0–7.0)

## 2020-08-02 LAB — GLUCOSE, POCT (MANUAL RESULT ENTRY): POC Glucose: 112 mg/dl — AB (ref 70–99)

## 2020-08-02 MED ORDER — ATORVASTATIN CALCIUM 10 MG PO TABS: 10.0000 mg | ORAL_TABLET | Freq: Every day | ORAL | 2 refills | Status: DC

## 2020-08-02 NOTE — Progress Notes (Signed)
Patient ID: Cathy Green, female    DOB: 18-Dec-1980  MRN: [id number]  CC: Diabetes   Subjective: Cathy Green is a 39 y.o. female who presents for chronic ds management Her concerns today include:  Hx Dm type 1 (dx at age 75) with nephropathy, retinopathy and neuropathy. History of CKD stage5,ACD,HTNand vitamin D deficiency.  Had transplant [facility]/22/20 [facility].  Had f/u visit today with transplant team Currently on several immunosuppressant drugs including prednisone, mycophenolate and Prograf.  Blood pressure has been doing good.  She has a log book with her.  Most of her systolic blood pressures are in the 440N and diastolic in the 02V to 25D.  She is still on amlodipine. -Reports being told that her vitamin B12 level is low.  She is told to start taking vitamin B12 1000 mcg daily.  DM: Insulin has been changed to Lantus 38 units at night and NovoLog 10 units with meals.  She reports that blood sugars were running high when she was on a higher dose of prednisone.  She has now been weaned to 5 mg of prednisone daily and blood sugars have steadily come down.  She has blood sugar logs with her.  She has been checking blood sugars 3-4 times a day before meals and at bedtime.  Blood sugar before breakfast range 93-160, before dinner 137-172 and at bedtime 99-210 with 210 being the highest.  She is doing better with her eating habits.  She has been told that she can walk for exercise.  Patient Active Problem List   Diagnosis Date Noted  . Hyperlipidemia due to type 1 diabetes mellitus ([place]) 08/02/2020  . Hypertensive kidney disease 08/02/2020  . Kidney transplant recipient 08/02/2020  . Hypoglycemia due to insulin 02/12/2020  . 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 Migraine without aura and without status migrainosus, not intractable 06/22/2019  . Chest pain 11/04/2018  . Hyperkalemia 11/04/2018  . Diabetic retinopathy ([place]) 04/26/2017  . Proteinuria 11/01/2015   . Normocytic anemia 09/07/2011  . Hyperlipidemia 09/07/2011  . Hypertension 09/07/2011     Current Outpatient Medications on File Prior to Visit  Medication Sig Dispense Refill  . insulin aspart (NOVOLOG) 100 UNIT/ML FlexPen Inject into the skin.    Marland Kitchen insulin glargine (LANTUS SOLOSTAR) 100 UNIT/ML Solostar Pen Inject into the skin.    . mycophenolate (MYFORTIC) 180 MG EC tablet Take by mouth.    . predniSONE (DELTASONE) 5 MG tablet Take by mouth.    . sulfamethoxazole-trimethoprim (BACTRIM) 400-80 MG tablet Take by mouth.    . tacrolimus (PROGRAF) 1 MG capsule Take 4 capsules in the morning and 3 capsules at night    . amLODipine (NORVASC) 5 MG tablet Take 1 tablet (5 mg total) by mouth daily. 30 tablet 6  . atorvastatin (LIPITOR) 10 MG tablet Take 1 tablet (10 mg total) by mouth daily. 30 tablet 2  . Blood Glucose Monitoring Suppl (CONTOUR NEXT MONITOR) w/Device KIT Use as instructed to check blood sugar TID. 1 kit 0  . calcitRIOL (ROCALTROL) 0.25 MCG capsule Take 0.25 mcg by mouth daily.    . Continuous Blood Gluc Receiver (DEXCOM G6 RECEIVER) DEVI Use to check blood sugars 3 times daily. 1 each 0  . Continuous Blood Gluc Sensor (DEXCOM G6 SENSOR) MISC Use to check blood sugar TID. Replace every 10 days. 3 each 2  . Continuous Blood Gluc Transmit (DEXCOM G6 TRANSMITTER) MISC Use to check blood sugar three times  a day. Replace every 3 months. 1 each 1  . cyanocobalamin 1000 MCG tablet Take by mouth.    . fluticasone (FLONASE) 50 MCG/ACT nasal spray Place 2 sprays into both nostrils daily. 16 g 6  . gabapentin (NEURONTIN) 300 MG capsule TAKE 1 CAPSULE (300 MG TOTAL) BY MOUTH 2 (TWO) TIMES DAILY. 60 capsule 0  . glucose blood (CONTOUR NEXT TEST) test strip Use as instructed to check blood sugar TID. 100 each 6  . Microlet Lancets MISC Use as instructed to check blood sugar TID. 100 each 6  . sevelamer carbonate (RENVELA) 800 MG tablet Take 800 mg by mouth 2 (two) times daily.    . sodium  bicarbonate 650 MG tablet Take 1 tablet (650 mg total) by mouth 2 (two) times daily. 60 tablet 5  . valGANciclovir (VALCYTE) 450 MG tablet Take 450 mg by mouth daily.    . Vitamin D, Ergocalciferol, (DRISDOL) 1.25 MG (50000 UT) CAPS capsule Take 1 capsule (50,000 Units total) by mouth every 7 (seven) days. (Patient not taking: Reported on 02/12/2020) 4 capsule 0   No current facility-administered medications on file prior to visit.    Allergies  Allergen Reactions  . Garlic Nausea And Vomiting    Social History   Socioeconomic History  . Marital status: Single    Spouse name: Not on file  . Number of children: Not on file  . Years of education: Not on file  . Highest education level: Not on file  Occupational History  . Not on file  Tobacco Use  . Smoking status: Never Smoker  . Smokeless tobacco: Never Used  Substance and Sexual Activity  . Alcohol use: Yes    Comment: occasional   . Drug use: No  . Sexual activity: Never    Birth control/protection: Pill  Other Topics Concern  . Not on file  Social History Narrative   ** Merged History Encounter **       Social Determinants of Health   Financial Resource Strain:   . Difficulty of Paying Living Expenses: Not on file  Food Insecurity:   . Worried About Charity fundraiser in the Last Year: Not on file  . Ran Out of Food in the Last Year: Not on file  Transportation Needs:   . Lack of Transportation (Medical): Not on file  . Lack of Transportation (Non-Medical): Not on file  Physical Activity:   . Days of Exercise per Week: Not on file  . Minutes of Exercise per Session: Not on file  Stress:   . Feeling of Stress : Not on file  Social Connections:   . Frequency of Communication with Friends and Family: Not on file  . Frequency of Social Gatherings with Friends and Family: Not on file  . Attends Religious Services: Not on file  . Active Member of Clubs or Organizations: Not on file  . Attends Archivist  Meetings: Not on file  . Marital Status: Not on file  Intimate Partner Violence:   . Fear of Current or Ex-Partner: Not on file  . Emotionally Abused: Not on file  . Physically Abused: Not on file  . Sexually Abused: Not on file    Family History  Problem Relation Age of Onset  . Cancer Maternal Grandfather        prostate  . Hypertension Mother   . Stroke Mother   . Cancer Other   . COPD Other   . Hyperlipidemia Other   .  Hypertension Father     Past Surgical History:  Procedure Laterality Date  . WISDOM TOOTH EXTRACTION      ROS: Review of Systems Negative except as stated above  PHYSICAL EXAM: BP 117/81   Pulse 99   Resp 16   Ht '5\' 1"'  (1.549 m)   Wt 166 lb 12.8 oz (75.7 kg)   SpO2 99%   BMI 31.52 kg/m   Physical Exam  General appearance - alert, well appearing, and in no distress Mental status - normal mood, behavior, speech, dress, motor activity, and thought processes Mouth - mucous membranes moist, pharynx normal without lesions Neck - supple, no significant adenopathy Chest - clear to auscultation, no wheezes, rales or rhonchi, symmetric air entry Heart - normal rate, regular rhythm, normal S1, S2, no murmurs, rubs, clicks or gallops Extremities - peripheral pulses normal, no pedal edema, no clubbing or cyanosis  Results for orders placed or performed in visit on 08/02/20  POCT glucose (manual entry)  Result Value Ref Range   POC Glucose 112 (A) 70 - 99 mg/dl  POCT glycosylated hemoglobin (Hb A1C)  Result Value Ref Range   Hemoglobin A1C     HbA1c POC (<> result, manual entry)     HbA1c, POC (prediabetic range)     HbA1c, POC (controlled diabetic range) 8.1 (A) 0.0 - 7.0 %   Recent labs from [facility] reviewed: Lipid profile total cholesterol 239, LDL 140, Hemoglobin 9.2/creatinine 27.8, platelet count 354  CMP Latest Ref Rng & Units 08/15/2019 01/26/2019 01/06/2019  Glucose 70 - 99 mg/dL 300(H) 126(H) 120(H)  BUN 6 - 20 mg/dL 55(H)  33(H) 40(H)  Creatinine 0.44 - 1.00 mg/dL 4.65(H) 3.80(H) 3.75(H)  Sodium 135 - 145 mmol/L 136 140 139  Potassium 3.5 - 5.1 mmol/L 5.4(H) 5.0 4.6  Chloride 98 - 111 mmol/L 107 110(H) 105  CO2 22 - 32 mmol/L 19(L) 20 20  Calcium 8.9 - 10.3 mg/dL 9.2 9.5 9.4  Total Protein 6.0 - 8.5 g/dL - - -  Total Bilirubin 0.0 - 1.2 mg/dL - - -  Alkaline Phos 39 - 117 IU/L - - -  AST 0 - 40 IU/L - - -  ALT 0 - 32 IU/L - - -   Lipid Panel     Component Value Date/Time   CHOL 221 (H) 11/04/2018 0600   CHOL 227 (H) 05/27/2018 1602   TRIG 101 11/04/2018 0600   HDL 48 11/04/2018 0600   HDL 59 05/27/2018 1602   CHOLHDL 4.6 11/04/2018 0600   VLDL 20 11/04/2018 0600   LDLCALC 153 (H) 11/04/2018 0600   LDLCALC 140 (H) 05/27/2018 1602    CBC    Component Value Date/Time   WBC 5.0 11/12/2018 1030   WBC 4.4 11/05/2018 0510   RBC 3.37 (L) 11/12/2018 1030   RBC 3.01 (L) 11/05/2018 0510   RBC 3.01 (L) 11/05/2018 0510   HGB 8.9 (L) 11/12/2018 1030   HGB 11.6 09/07/2011 1500   HCT 28.3 (L) 11/12/2018 1030   HCT 34.7 (L) 09/07/2011 1500   PLT 217 11/12/2018 1030   MCV 84 11/12/2018 1030   MCV 79.4 (L) 09/07/2011 1500   MCH 26.4 (L) 11/12/2018 1030   MCH 27.6 11/05/2018 0510   MCHC 31.4 (L) 11/12/2018 1030   MCHC 31.6 11/05/2018 0510   RDW 12.9 11/12/2018 1030   RDW 13.8 09/07/2011 1500   LYMPHSABS 1.6 11/12/2018 1030   LYMPHSABS 1.7 09/07/2011 1500   MONOABS 0.3 05/31/2018 1005  MONOABS 0.2 09/07/2011 1500   EOSABS 0.1 11/12/2018 1030   BASOSABS 0.0 11/12/2018 1030   BASOSABS 0.0 09/07/2011 1500    ASSESSMENT AND PLAN:  1. 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  Will refer to endocrinology now that she has Medicaid.  Continue to monitor blood sugars. - POCT glucose (manual entry) - POCT  glycosylated hemoglobin (Hb A1C) - Ambulatory referral to Endocrinology  2. Kidney transplant recipient Followed by kidney transplant team at [facility].  Medication list has been updated to reflect current medications from the transplant team.  3. Hypertensive kidney disease Blood pressure looks pretty good post transplant.  Continue Norvasc  4. Hyperlipidemia due to type 1 diabetes mellitus ([place]) -does not have Lipitor.  Refill sent to her pharmacy   Patient was given the opportunity to ask questions.  Patient verbalized understanding of the plan and was able to repeat key elements of the plan.   Orders Placed This Encounter  Procedures  . Ambulatory referral to Endocrinology  . POCT glucose (manual entry)  . POCT glycosylated hemoglobin (Hb A1C)     Requested Prescriptions    No prescriptions requested or ordered in this encounter    Return in about 4 months (around 11/30/2020).  Karle Plumber, MD, FACP

## 2020-08-03 MED FILL — ATORVASTATIN 10 MG TABLET: 10 | 30 days supply | Qty: 30 | Fill #0

## 2020-08-12 MED FILL — LANTUS SOLOSTAR 100 UNITS/M: 100 | 41 days supply | Qty: 15 | Fill #1

## 2020-08-12 MED FILL — CONTOUR NEXT STRIPS: 25 days supply | Qty: 100 | Fill #1

## 2020-08-24 ENCOUNTER — Other Ambulatory Visit: Payer: Self-pay | Admitting: Family Medicine

## 2020-08-24 MED ORDER — INSULIN ASPART 100 UNIT/ML FLEXPEN
10.0000 [IU] | PEN_INJECTOR | Freq: Three times a day (TID) | SUBCUTANEOUS | 0 refills | Status: DC
Start: 2020-08-24 — End: 2020-11-14

## 2020-08-24 MED FILL — NOVOLOG FLEXPEN SYRINGE: 100 | 30 days supply | Qty: 9 | Fill #0

## 2020-08-30 ENCOUNTER — Other Ambulatory Visit: Payer: Self-pay | Admitting: Internal Medicine

## 2020-08-30 DIAGNOSIS — E1042 Type 1 diabetes mellitus with diabetic polyneuropathy: Secondary | ICD-10-CM

## 2020-08-30 DIAGNOSIS — N184 Chronic kidney disease, stage 4 (severe): Secondary | ICD-10-CM

## 2020-08-30 MED FILL — TRUEPLUS PEN NDL 31G X 1/4: 31G X 6 MM | 25 days supply | Qty: 100 | Fill #2

## 2020-08-30 MED FILL — GABAPENTIN 300 MG CAPSULE: 300 | 30 days supply | Qty: 60 | Fill #0

## 2020-08-30 MED FILL — SODIUM BICARBONATE 650 MG T: 650 | 30 days supply | Qty: 120 | Fill #0

## 2020-08-30 MED FILL — ATORVASTATIN 10 MG TABLET: 10 | 30 days supply | Qty: 30 | Fill #1

## 2020-09-01 ENCOUNTER — Encounter: Payer: Self-pay | Admitting: Internal Medicine

## 2020-09-06 MED FILL — CALCITRIOL 0.25 MCG CAPS: 0.25 | 90 days supply | Qty: 90 | Fill #2

## 2020-09-27 ENCOUNTER — Other Ambulatory Visit: Payer: Self-pay | Admitting: Internal Medicine

## 2020-09-27 DIAGNOSIS — E1042 Type 1 diabetes mellitus with diabetic polyneuropathy: Secondary | ICD-10-CM

## 2020-09-27 MED FILL — GABAPENTIN 300 MG CAPSULE: 300 | 90 days supply | Qty: 180 | Fill #0

## 2020-09-27 MED FILL — TRUEPLUS PEN NDL 31G X 1/4: 31G X 6 MM | 25 days supply | Qty: 100 | Fill #3

## 2020-09-27 MED FILL — LATANOPROST 0.005% EYE DRP: 0.005 | 18 days supply | Qty: 3 | Fill #1

## 2020-09-27 MED FILL — LANTUS SOLOSTAR 100 UNITS/M: 100 | 41 days supply | Qty: 15 | Fill #2

## 2020-09-27 MED FILL — SODIUM BICARBONATE 650 MG T: 650 | 30 days supply | Qty: 120 | Fill #1

## 2020-09-27 MED FILL — ATORVASTATIN 10 MG TABLET: 10 | 30 days supply | Qty: 30 | Fill #2

## 2020-09-27 MED FILL — NOVOLOG FLEXPEN SYRINGE: 100 | 30 days supply | Qty: 9 | Fill #1

## 2020-10-04 IMAGING — DX DG CHEST 2V
2 series · 2 of 2 positions shown · non-contrast
Comparison: None.

CLINICAL DATA: Central chest pain for 1 day. Hypertension and
diabetes. Nonsmoker.

EXAM:
CHEST - 2 VIEW

[chest pa]
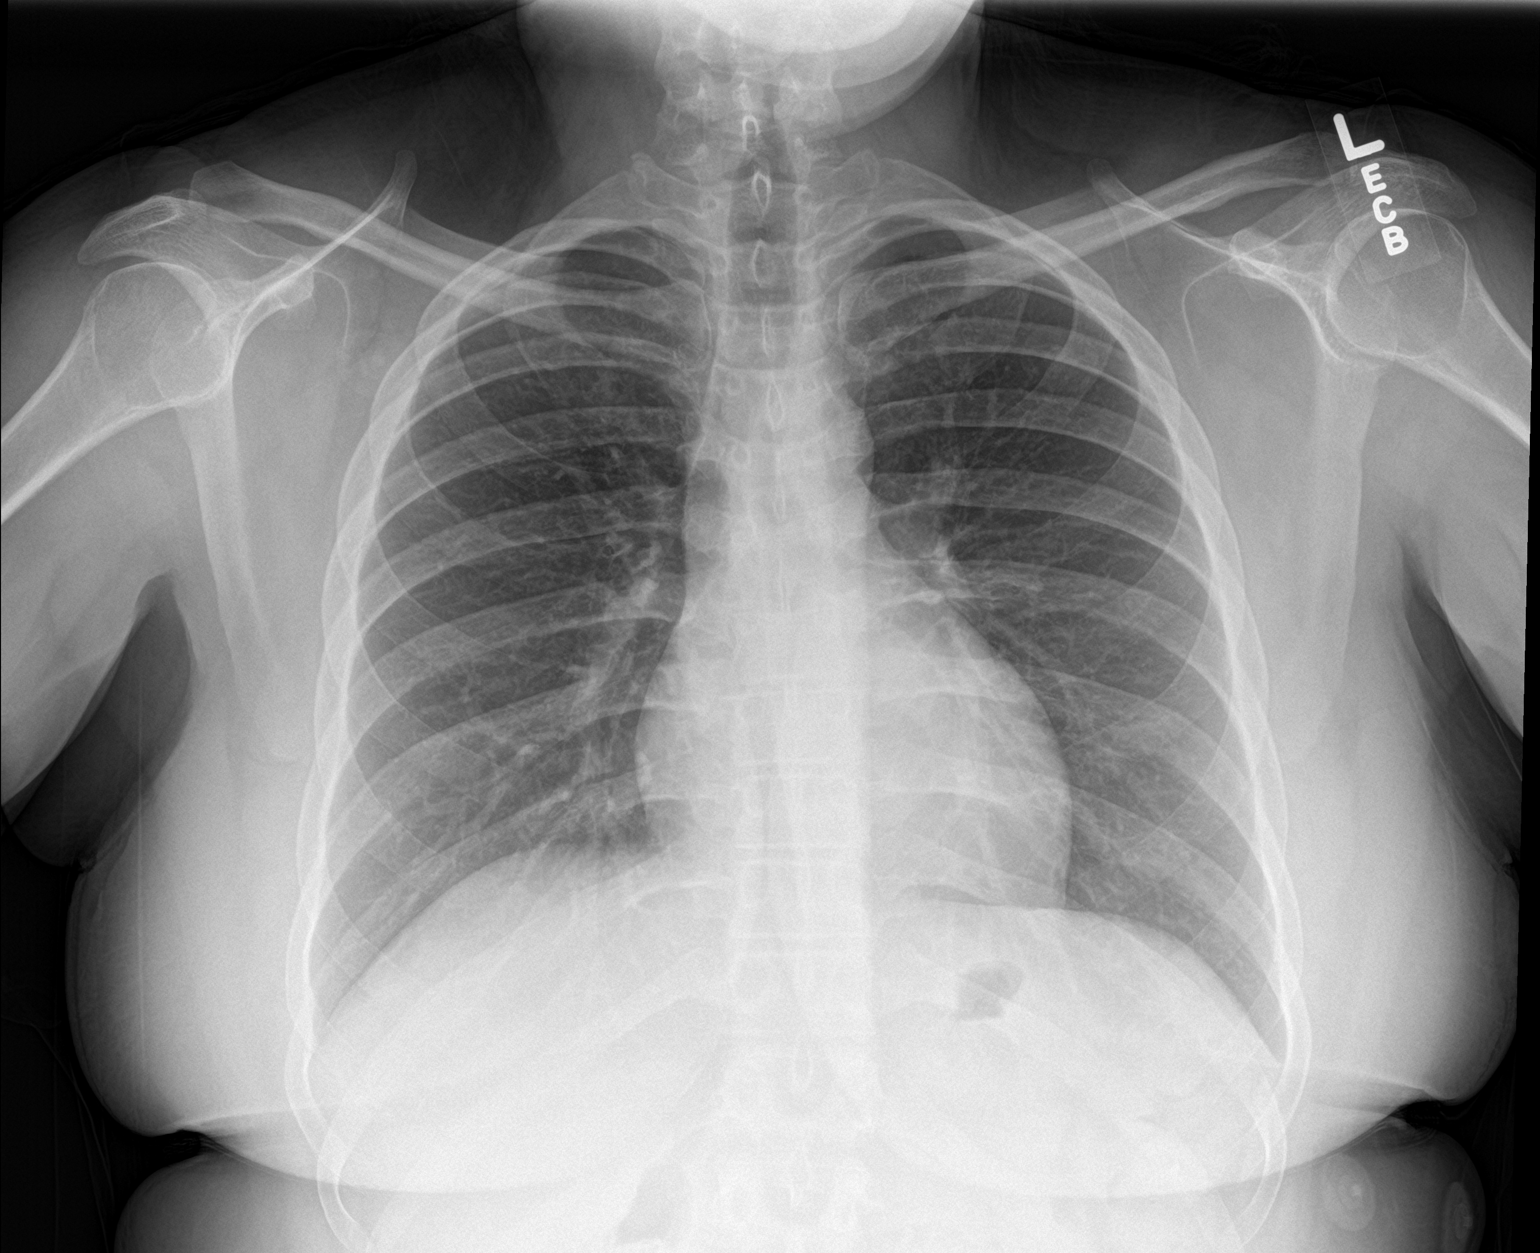

[chest lat]
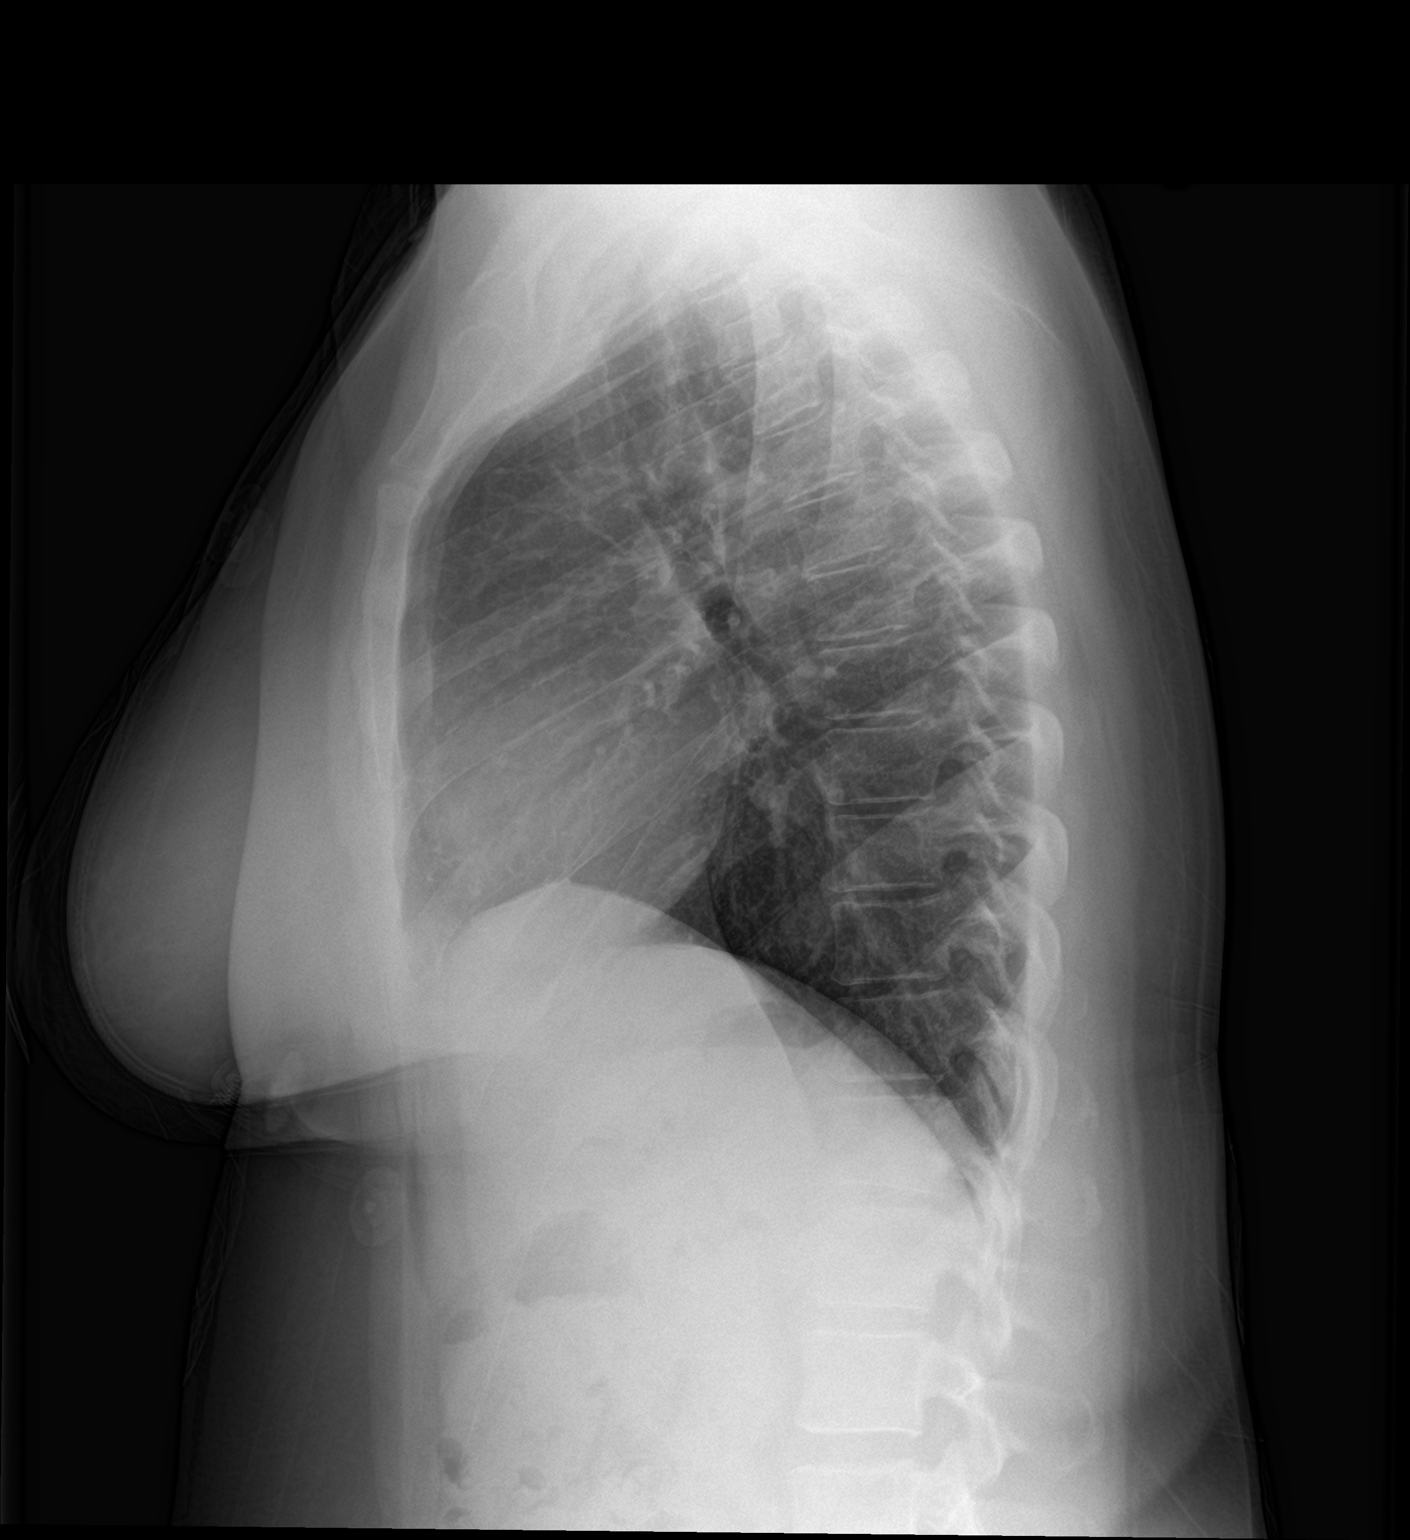

[2 of 2 positions shown; findings below may reference images not displayed]

FINDINGS: The heart size and mediastinal contours are within normal limits.
Both lungs are clear. The visualized skeletal structures are
unremarkable.
IMPRESSION: No active cardiopulmonary disease.

## 2020-10-11 MED FILL — CONTOUR NEXT STRIPS: 25 days supply | Qty: 100 | Fill #2

## 2020-10-18 ENCOUNTER — Other Ambulatory Visit: Payer: Self-pay | Admitting: Internal Medicine

## 2020-10-18 DIAGNOSIS — Z79899 Other long term (current) drug therapy: Secondary | ICD-10-CM | POA: Insufficient documentation

## 2020-10-31 ENCOUNTER — Other Ambulatory Visit: Payer: Self-pay | Admitting: Internal Medicine

## 2020-10-31 DIAGNOSIS — N184 Chronic kidney disease, stage 4 (severe): Secondary | ICD-10-CM

## 2020-11-07 ENCOUNTER — Ambulatory Visit: Payer: BLUE CROSS/BLUE SHIELD | Admitting: Internal Medicine

## 2020-11-07 MED FILL — SODIUM BICARBONATE 650 MG T: 650 | 30 days supply | Qty: 120 | Fill #0

## 2020-11-14 ENCOUNTER — Encounter: Payer: Self-pay | Admitting: Internal Medicine

## 2020-11-14 ENCOUNTER — Other Ambulatory Visit: Payer: Self-pay

## 2020-11-14 ENCOUNTER — Ambulatory Visit: Payer: BLUE CROSS/BLUE SHIELD | Admitting: Internal Medicine

## 2020-11-14 ENCOUNTER — Other Ambulatory Visit: Payer: Self-pay | Admitting: Internal Medicine

## 2020-11-14 VITALS — BP 130/82 | HR 100 | Ht 61.0 in | Wt 175.1 lb

## 2020-11-14 DIAGNOSIS — E1021 Type 1 diabetes mellitus with diabetic nephropathy: Secondary | ICD-10-CM

## 2020-11-14 DIAGNOSIS — E108 Type 1 diabetes mellitus with unspecified complications: Secondary | ICD-10-CM | POA: Diagnosis not present

## 2020-11-14 DIAGNOSIS — E109 Type 1 diabetes mellitus without complications: Secondary | ICD-10-CM | POA: Insufficient documentation

## 2020-11-14 DIAGNOSIS — E1065 Type 1 diabetes mellitus with hyperglycemia: Secondary | ICD-10-CM

## 2020-11-14 DIAGNOSIS — Z940 Kidney transplant status: Secondary | ICD-10-CM

## 2020-11-14 DIAGNOSIS — N1832 Chronic kidney disease, stage 3b: Secondary | ICD-10-CM

## 2020-11-14 DIAGNOSIS — E103413 Type 1 diabetes mellitus with severe nonproliferative diabetic retinopathy with macular edema, bilateral: Secondary | ICD-10-CM | POA: Diagnosis not present

## 2020-11-14 DIAGNOSIS — E1042 Type 1 diabetes mellitus with diabetic polyneuropathy: Secondary | ICD-10-CM | POA: Diagnosis not present

## 2020-11-14 DIAGNOSIS — E1022 Type 1 diabetes mellitus with diabetic chronic kidney disease: Secondary | ICD-10-CM | POA: Insufficient documentation

## 2020-11-14 DIAGNOSIS — IMO0002 Reserved for concepts with insufficient information to code with codable children: Secondary | ICD-10-CM | POA: Insufficient documentation

## 2020-11-14 LAB — POCT GLYCOSYLATED HEMOGLOBIN (HGB A1C): Hemoglobin A1C: 6.5 % — AB (ref 4.0–5.6)

## 2020-11-14 MED ORDER — INSULIN PEN NEEDLE 32G X 4 MM MISC: 1.0000 | Freq: Four times a day (QID) | 3 refills | Status: DC

## 2020-11-14 MED ORDER — DEXCOM G6 SENSOR MISC: 1.0000 | 3 refills | Status: DC

## 2020-11-14 MED ORDER — LANTUS SOLOSTAR 100 UNIT/ML ~~LOC~~ SOPN: 32.0000 [IU] | PEN_INJECTOR | Freq: Every day | SUBCUTANEOUS | 3 refills | Status: DC

## 2020-11-14 MED ORDER — INSULIN ASPART 100 UNIT/ML FLEXPEN: 10.0000 [IU] | PEN_INJECTOR | Freq: Three times a day (TID) | SUBCUTANEOUS | 3 refills | Status: DC

## 2020-11-14 MED ORDER — DEXCOM G6 TRANSMITTER MISC: 1.0000 | 3 refills | Status: DC

## 2020-11-14 NOTE — Patient Instructions (Signed)
-   Decrease Lantus to 32 units daily  - continue Novolog 10 units with each meal  - Novolog correctional insulin: ADD extra units on insulin to your meal-time Novolog dose if your blood sugars are higher than 165. Use the scale below to help guide you:   Blood sugar before meal Number of units to inject  Less than 165 0 unit  166 -  190 1 units  191 -  215 2 units  216 -  240 3 units  241 -  265 4 units  266 -  290 5 units  291 -  315 6 units  316 -  340 7 units  341 -  365 8 units  366- 390 9 units      HOW TO TREAT LOW BLOOD SUGARS (Blood sugar LESS THAN 70 MG/DL)  Please follow the RULE OF 15 for the treatment of hypoglycemia treatment (when your (blood sugars are less than 70 mg/dL)    STEP 1: Take 15 grams of carbohydrates when your blood sugar is low, which includes:   3-4 GLUCOSE TABS  OR  3-4 OZ OF JUICE OR REGULAR SODA OR  ONE TUBE OF GLUCOSE GEL     STEP 2: RECHECK blood sugar in 15 MINUTES STEP 3: If your blood sugar is still low at the 15 minute recheck --> then, go back to STEP 1 and treat AGAIN with another 15 grams of carbohydrates.

## 2020-11-14 NOTE — Progress Notes (Signed)
Name: Cathy Green  MRN/ DOB: [id number], 26-Dec-1980   Age/ Sex: 40 y.o., female    PCP: Ladell Pier, MD   Reason for Endocrinology Evaluation: Type 1 Diabetes Mellitus     Date of Initial Endocrinology Visit: 11/14/2020     PATIENT IDENTIFIER: Cathy Green is a 39 y.o. female with a past medical history of T1DM, HTN, and Dyslipidemia. The patient presented for initial endocrinology clinic visit on 11/14/2020 for consultative assistance with her diabetes management.    HPI: Cathy Green was    Diagnosed with DM  At age 75 Prior Medications tried/Intolerance:  Metformin - GI intolerance  Currently checking blood sugars 2 x / day Hypoglycemia episodes : yes            Symptoms: yes                 Frequency: 1-2/ weeks  Hemoglobin A1c has ranged from 6.2% in 01/2020, peaking at 8.1% in 08/2020. Patient required assistance for hypoglycemia: no Patient has required hospitalization within the last 1 year from hyper or hypoglycemia: no  In terms of diet, the patient eats 2 meals a meal. Snacks before dinner. Avoids sugar-sweetened beverages   S/P renal transplant 06/2020- Through [facility]  No hx of dialysis     HOME DIABETES REGIMEN: Lantus 38 units daily  Novolog 10 units with each meal  Prednisone 5 mg daily  Mycophenilate   Statin: yes ACE-I/ARB: no Prior Diabetic Education: long time ago    METER DOWNLOAD SUMMARY: Date range evaluated: 1/31-2/14/2022 Average Number Tests/Day = 2.2 Overall Mean FS Glucose = 147 Standard Deviation = 61  BG Ranges: Low = 60 High = 317   Hypoglycemic Events/30 Days: BG < 50 = 0 Episodes of symptomatic severe hypoglycemia = 0   DIABETIC COMPLICATIONS: Microvascular complications:   Neuropathy, CKD (S/P transplant), retinopathy ( S/P injections)  Last eye exam: Completed 08/2020  Macrovascular complications:    Denies: CAD, PVD, CVA   PAST HISTORY: Past Medical History:  Past Medical History:  Diagnosis  Date  . Abnormal Pap smear   . Allergy    seasonal allergies  . Anemia   . Chronic kidney disease   . Diabetes mellitus    diagnosed age 6  . Diabetes mellitus without complication ([place])   . Glaucoma   . Hyperlipidemia   . Hypertension   . Migraines   . Noncompliance   . Numbness in left leg    Past Surgical History:  Past Surgical History:  Procedure Laterality Date  . KIDNEY TRANSPLANT  06/2020  . WISDOM TOOTH EXTRACTION        Social History:  reports that she has never smoked. She has never used smokeless tobacco. She reports current alcohol use. She reports that she does not use drugs. Family History:  Family History  Problem Relation Age of Onset  . Cancer Maternal Grandfather        prostate  . Hypertension Mother   . Stroke Mother   . Cancer Other   . COPD Other   . Hyperlipidemia Other   . Hypertension Father      HOME MEDICATIONS: Allergies as of 11/14/2020      Reactions   Garlic Nausea And Vomiting      Medication List       Accurate as of November 14, 2020  4:17 PM. If you have any questions, ask your nurse or doctor.        STOP taking  these medications   calcitRIOL 0.25 MCG capsule Commonly known as: ROCALTROL Stopped by: Dorita Sciara, MD   Dexcom G6 Receiver Devi Stopped by: Dorita Sciara, MD   sulfamethoxazole-trimethoprim 400-80 MG tablet Commonly known as: BACTRIM Stopped by: Dorita Sciara, MD   Vitamin D (Ergocalciferol) 1.25 MG (50000 UNIT) Caps capsule Commonly known as: DRISDOL Stopped by: Dorita Sciara, MD     TAKE these medications   amLODipine 5 MG tablet Commonly known as: NORVASC Take 1 tablet (5 mg total) by mouth daily.   atorvastatin 10 MG tablet Commonly known as: LIPITOR Take 1 tablet (10 mg total) by mouth daily.   Contour Next Monitor w/Device Kit Use as instructed to check blood sugar TID.   Contour Next Test test strip Generic drug: glucose blood Use as instructed to  check blood sugar TID.   cyanocobalamin 1000 MCG tablet Take by mouth.   Dexcom G6 Sensor Misc 1 Device by Does not apply route as directed. What changed:   how much to take  how to take this  when to take this  additional instructions Changed by: Dorita Sciara, MD   Dexcom G6 Transmitter Misc 1 Device by Does not apply route as directed. What changed:   how much to take  how to take this  when to take this  additional instructions Changed by: Dorita Sciara, MD   fluticasone 50 MCG/ACT nasal spray Commonly known as: FLONASE Place 2 sprays into both nostrils daily.   gabapentin 300 MG capsule Commonly known as: NEURONTIN TAKE 1 CAPSULE (300 MG TOTAL) BY MOUTH 2 (TWO) TIMES DAILY.   insulin aspart 100 UNIT/ML FlexPen Commonly known as: NOVOLOG Inject 10 Units into the skin 3 (three) times daily with meals.   Lantus SoloStar 100 UNIT/ML Solostar Pen Generic drug: insulin glargine Inject 38 Units into the skin at bedtime.   Microlet Lancets Misc Use as instructed to check blood sugar TID.   mycophenolate 180 MG EC tablet Commonly known as: MYFORTIC Take by mouth.   predniSONE 5 MG tablet Commonly known as: DELTASONE Take 5 mg by mouth daily with breakfast. What changed: Another medication with the same name was removed. Continue taking this medication, and follow the directions you see here. Changed by: Dorita Sciara, MD   sevelamer carbonate 800 MG tablet Commonly known as: RENVELA Take 800 mg by mouth 2 (two) times daily.   sodium bicarbonate 650 MG tablet TAKE 2 TABLETS (1,300 MG TOTAL) BY MOUTH 2 TIMES DAILY.   tacrolimus 1 MG capsule Commonly known as: PROGRAF Take 4 capsules in the morning and 3 capsules at night   valGANciclovir 450 MG tablet Commonly known as: VALCYTE Take 450 mg by mouth daily.        ALLERGIES: Allergies  Allergen Reactions  . Garlic Nausea And Vomiting     REVIEW OF SYSTEMS: A  comprehensive ROS was conducted with the patient and is negative except as per HPI and below:  Review of Systems  Gastrointestinal: Negative for diarrhea and nausea.  Endo/Heme/Allergies: Positive for polydipsia.      OBJECTIVE:   VITAL SIGNS: BP 130/82   Pulse 100   Ht _0  (1.549 m)   Wt 175 lb 2 oz (79.4 kg)   LMP 11/08/2020   SpO2 98%   BMI 33.09 kg/m    PHYSICAL EXAM:  General: Pt appears well and is in NAD  Neck: General: Supple without adenopathy or carotid bruits. Thyroid: Thyroid size  normal.  No goiter or nodules appreciated. No thyroid bruit.  Lungs: Clear with good BS bilat with no rales, rhonchi, or wheezes  Heart: RRR  Abdomen: Normoactive bowel sounds, soft, nontender, without masses or organomegaly palpable  Extremities:  Lower extremities - No pretibial edema. No lesions.  Skin: Normal texture and temperature to palpation.   Neuro: MS is good with appropriate affect, pt is alert and Ox3    DM foot exam: 11/14/2020  The skin of the feet is intact without sores or ulcerations. The pedal pulses are 2+ on right and 2+ on left. The sensation is intact to a screening 5.07, 10 gram monofilament bilaterally   DATA REVIEWED:  Lab Results  Component Value Date   HGBA1C 6.5 (A) 11/14/2020   HGBA1C 8.1 (A) 08/02/2020   HGBA1C 6.2 02/12/2020   Lab Results  Component Value Date   MICROALBUR 79.7 12/27/2015   LDLCALC 153 (H) 11/04/2018   CREATININE 4.65 (H) 08/15/2019   Lab Results  Component Value Date   MICRALBCREAT 1,064.8 (H) 05/27/2018    Lab Results  Component Value Date   CHOL 221 (H) 11/04/2018   HDL 48 11/04/2018   LDLCALC 153 (H) 11/04/2018   TRIG 101 11/04/2018   CHOLHDL 4.6 11/04/2018       Sodium 137 135 - 146 MMOL/L [place] [facility]   Potassium 6.4 (HH) Comment:  NO VISIBLE HEMOLYSIS Called to and readback by Kathyrn Lass at 1220 11-04-2020 JP 3.5 - 5.3 MMOL/L [place] [facility]   Chloride 109  98 - 110 MMOL/L [place] [facility]   CO2 22 (L) 23 - 30 MMOL/L [place] [facility]   BUN 24 8 - 24 MG/DL [place] [facility]   Glucose 122 (H)Comment: Patients taking eltrombopag at doses >/= 100 mg daily may show falsely elevated values of 10% or greater. 70 - 99 MG/DL [place] [facility]   Creatinine 1.83 (H) 0.50 - 1.50 MG/DL [place] [facility]   Calcium 9.3 8.5 - 10.5 MG/DL [place] [facility]   Total Protein 6.7Comment: Patients taking eltrombopag at doses >/= 100 mg daily may show falsely elevated values of 10% or greater. 6.0 - 8.3 G/DL [place] [facility]   Albumin  4.3 3.5 - 5.0 G/DL [place] [facility]   Total Bilirubin 0.4Comment: Patients taking eltrombopag at doses >/= 100 mg daily may show falsely elevated values of 10% or greater. 0.1 - 1.2 MG/DL [place] [facility]   Alkaline Phosphatase 41 25 - 125 IU/L or U/L [place] [facility]   AST (SGOT) 14 5 - 40 IU/L or U/L [place] [facility]   ALT (SGPT) 13 5 - 50 IU/L or U/L [place] [facility]   Anion Gap 6 4 - 14 MMOL/L [place] [facility]   Est. GFR 36 (L) Comment:  GFR estimated by CKD-EPI equations.  "Recommend confirmation of Cr-based eGFR by using Cys-based eGFR and other filtration markers (if applicable) in complex cases and clinical decision-making, as needed."       ASSESSMENT / PLAN / RECOMMENDATIONS:   1) Type 1 Diabetes Mellitus, Optimally controlled, With neuropathic, retinopathic complications and S/P renal transplant   - Most recent A1c of 6.5 %. Goal A1c < 7.0 %.  GENERAL: I have discussed with the patient the pathophysiology of diabetes. We went over the natural progression of the disease. We talked about both insulin resistance and insulin deficiency. We stressed the importance  of lifestyle changes including diet and exercise. I explained the complications associated with diabetes including retinopathy, nephropathy, neuropathy as well as increased risk of cardiovascular disease. We went over the benefit seen with glycemic control.    I explained to the patient that diabetic patients are at higher than normal risk for amputations.   Will prescribe Dexcom, will consider a pump in the future.  Discussed pharmacokinetics of basal/bolus insulin and the importance of taking prandial insulin with meals.   We also discussed avoiding sugar-sweetened beverages and snacks, when possible.   Will reduce basal insulin due to hypoglycemia overnight   She will be provided with a correction scale as well   MEDICATIONS:  - Decrease Lantus to 32 units daily  - continue Novolog 10 units with each meal  - CF: Novolog ( BG -140/25)   EDUCATION / INSTRUCTIONS:  BG monitoring instructions: Patient is instructed to check her blood sugars 3 times a day, before meals   Call [facility] if: BG persistently < 70 . I reviewed the Rule of 15 for the treatment of hypoglycemia in detail with the patient. Literature supplied.   2) Diabetic complications:   Eye: Does  have known diabetic retinopathy.   Neuro/ Feet: Does  have known diabetic peripheral neuropathy.  Renal: Patient does  have known baseline CKD. She is not on an ACEI/ARB at present.  3) Lipids: Patient is on Atorvastatin .    I spent 45 minutes preparing to see the patient by review of recent labs, imaging and procedures, obtaining and reviewing separately obtained history, communicating with the patient/family or caregiver, ordering medications, tests or procedures, and documenting clinical information in the EHR including the differential Dx, treatment, and any further evaluation and other management    Signed electronically by: Mack Guise, MD  [facility] Endocrinology  [address] [place], [place] [address] Phone: [phone] FAX: [phone]   CC: Ladell Pier, MD [place] [address] Phone: [phone]  Fax: [phone]    Return to Endocrinology clinic as below: Future Appointments  Date Time Provider [place]  12/01/2020  3:30 PM Ladell Pier, MD [id number] None  02/15/2021  2:40 PM Davine Coba, Melanie Crazier, MD [id number] None

## 2020-11-15 MED FILL — NOVOLOG FLEXPEN SYRINGE: 100 | 30 days supply | Qty: 15 | Fill #0

## 2020-11-15 MED FILL — TRUEPLUS PEN NDL 32GX5/32: 32G X 4 MM | 25 days supply | Qty: 100 | Fill #0

## 2020-11-15 MED FILL — LANTUS SOLOSTAR 100 UNITS/M: 100 | 28 days supply | Qty: 30 | Fill #0

## 2020-11-23 MED FILL — CONTOUR NEXT STRIPS: 25 days supply | Qty: 100 | Fill #3

## 2020-12-01 ENCOUNTER — Ambulatory Visit: Payer: BLUE CROSS/BLUE SHIELD | Attending: Internal Medicine | Admitting: Internal Medicine

## 2020-12-01 ENCOUNTER — Other Ambulatory Visit: Payer: Self-pay

## 2020-12-01 ENCOUNTER — Other Ambulatory Visit: Payer: Self-pay | Admitting: Internal Medicine

## 2020-12-01 DIAGNOSIS — E103413 Type 1 diabetes mellitus with severe nonproliferative diabetic retinopathy with macular edema, bilateral: Secondary | ICD-10-CM

## 2020-12-01 DIAGNOSIS — Z940 Kidney transplant status: Secondary | ICD-10-CM

## 2020-12-01 DIAGNOSIS — I129 Hypertensive chronic kidney disease with stage 1 through stage 4 chronic kidney disease, or unspecified chronic kidney disease: Secondary | ICD-10-CM

## 2020-12-01 MED ORDER — SODIUM BICARBONATE 650 MG PO TABS
1300.0000 mg | ORAL_TABLET | Freq: Three times a day (TID) | ORAL | 5 refills | Status: DC
Start: 2020-12-01 — End: 2020-12-01

## 2020-12-01 NOTE — Progress Notes (Signed)
Pt states in the mornings her blood sugar ranges from  80-120 and at  night her blood sugar ranges 100-130

## 2020-12-01 NOTE — Progress Notes (Signed)
Virtual Visit via Telephone Note  I connected with Cathy Green on 12/01/20 at 4:25 p.m by telephone and verified that I am speaking with the correct person using two identifiers.  Location: Patient: home Provider: office  The patient, my CMA [name] and myself participated in this visit. I discussed the limitations, risks, security and privacy concerns of performing an evaluation and management service by telephone and the availability of in person appointments. I also discussed with the patient that there may be a patient responsible charge related to this service. The patient expressed understanding and agreed to proceed.   History of Present Illness: Hx Dm type 1 (dx at age 26) with nephropathy, retinopathy and neuropathy. 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 Last seen 08/2020. Purpose of today's visit is chronic disease management.  Still followed by the transplant team at [facility] status post kidney transplant in September of last year.  Dose of Prograf has been decreased to 3 mg twice a day.  She had COVID in January of this year.  She received monoclonal antibody infusion.  She is due for COVID booster vaccine.  She states they had told her to hold off on getting it until they see her again in follow-up.  She has an appointment with them tomorrow.  Request refill on sodium bicarb.  She states that the nephrologist had increased the dose to 2 tablets 3 times a day so she ran out of her last bottle which had the instructions of 2 tablets twice a day.  HTN:  BP doing good.  Readings have been near nl.  She continues to take amlodipine 5 mg once a day.  Denies any chest pains or shortness of breath.  No swelling in the legs.   DM: Since last visit with me she has establish with the endocrinologist Dr. Kelton Pillar..  Her most recent A1c was 6.5.  She is checking blood sugars regularly.  Reports morning blood sugars are  80-120 and nighttime blood sugars are 100-130.  She is doing better with her eating habits.  She walks to and from work every day. Outpatient Encounter Medications as of 12/01/2020  Medication Sig  . amLODipine (NORVASC) 5 MG tablet Take 1 tablet (5 mg total) by mouth daily.  Marland Kitchen atorvastatin (LIPITOR) 10 MG tablet Take 1 tablet (10 mg total) by mouth daily.  . Blood Glucose Monitoring Suppl (CONTOUR NEXT MONITOR) w/Device KIT Use as instructed to check blood sugar TID.  Marland Kitchen Continuous Blood Gluc Sensor (DEXCOM G6 SENSOR) MISC 1 Device by Does not apply route as directed.  . Continuous Blood Gluc Transmit (DEXCOM G6 TRANSMITTER) MISC 1 Device by Does not apply route as directed.  . cyanocobalamin 1000 MCG tablet Take by mouth.  . fluticasone (FLONASE) 50 MCG/ACT nasal spray Place 2 sprays into both nostrils daily.  Marland Kitchen gabapentin (NEURONTIN) 300 MG capsule TAKE 1 CAPSULE (300 MG TOTAL) BY MOUTH 2 (TWO) TIMES DAILY.  Marland Kitchen glucose blood (CONTOUR NEXT TEST) test strip Use as instructed to check blood sugar TID.  Marland Kitchen insulin aspart (NOVOLOG) 100 UNIT/ML FlexPen Inject 10 Units into the skin 3 (three) times daily with meals. Max daily 50 units  . insulin glargine (LANTUS SOLOSTAR) 100 UNIT/ML Solostar Pen Inject 32 Units into the skin at bedtime.  . Insulin Pen Needle 32G X 4 MM MISC 1 Device by Does not apply route in the morning, at noon, in the evening, and at bedtime.  Charlott Holler  Lancets MISC Use as instructed to check blood sugar TID.  Marland Kitchen mycophenolate (MYFORTIC) 180 MG EC tablet Take by mouth.  . predniSONE (DELTASONE) 5 MG tablet Take 5 mg by mouth daily with breakfast.  . sevelamer carbonate (RENVELA) 800 MG tablet Take 800 mg by mouth 2 (two) times daily.  . sodium bicarbonate 650 MG tablet TAKE 2 TABLETS (1,300 MG TOTAL) BY MOUTH 2 TIMES DAILY.  Marland Kitchen tacrolimus (PROGRAF) 1 MG capsule Take 4 capsules in the morning and 3 capsules at night  . valGANciclovir (VALCYTE) 450 MG tablet Take 450 mg by mouth daily.    No facility-administered encounter medications on file as of 12/01/2020.      Observations/Objective: Results for orders placed or performed in visit on 11/14/20  POCT HgB A1C  Result Value Ref Range   Hemoglobin A1C 6.5 (A) 4.0 - 5.6 %   HbA1c POC (<> result, manual entry)     HbA1c, POC (prediabetic range)     HbA1c, POC (controlled diabetic range)       Assessment and Plan: 1. Hypertensive kidney disease Blood pressure is under control.  Continue amlodipine. - sodium bicarbonate 650 MG tablet; Take 2 tablets (1,300 mg total) by mouth 3 (three) times daily.  Dispense: 180 tablet; Refill: 5  2. Kidney transplant recipient She continues to follow at [facility].  Has follow-up appointment tomorrow.  3. 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  Followed by endocrinologist.  She will continue dose of Lantus and NovoLog as recommended by the endocrinologist.  Continue to encourage healthy eating habits and regular exercise.   Follow Up Instructions: 4-6 wks for pap   I discussed the assessment and treatment plan with the patient. The patient was provided an opportunity to ask questions and all were answered. The patient agreed with the plan and demonstrated an understanding of the instructions.   The patient was advised to call back or seek an in-person evaluation if the symptoms worsen or if the condition fails to improve as anticipated.  I provided 9 minutes of non-face-to-face time during this encounter.   Karle Plumber, MD

## 2020-12-02 MED FILL — SODIUM BICARBONATE 650 MG T: 650 | 30 days supply | Qty: 180 | Fill #0

## 2020-12-23 ENCOUNTER — Other Ambulatory Visit: Payer: Self-pay | Admitting: Internal Medicine

## 2020-12-23 DIAGNOSIS — E1042 Type 1 diabetes mellitus with diabetic polyneuropathy: Secondary | ICD-10-CM

## 2020-12-23 MED FILL — LATANOPROST 0.005% EYE DRP: 0.005 | 18 days supply | Qty: 3 | Fill #2

## 2020-12-23 MED FILL — GABAPENTIN 300 MG CAPSULE: 300 | 90 days supply | Qty: 180 | Fill #0

## 2020-12-23 MED FILL — TRUEPLUS PEN NDL 32GX5/32: 32G X 4 MM | 25 days supply | Qty: 100 | Fill #1

## 2020-12-23 MED FILL — NOVOLOG FLEXPEN SYRINGE: 100 | 30 days supply | Qty: 9 | Fill #2

## 2020-12-23 NOTE — Telephone Encounter (Signed)
Requested Prescriptions  Pending Prescriptions Disp Refills  . gabapentin (NEURONTIN) 300 MG capsule [Pharmacy Med Name: GABAPENTIN 300 MG CAPSULE 300 Capsule] 180 capsule 0    Sig: TAKE 1 CAPSULE (300 MG TOTAL) BY MOUTH 2 (TWO) TIMES DAILY.     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

## 2020-12-26 MED FILL — CONTOUR NEXT STRIPS: 25 days supply | Qty: 100 | Fill #4

## 2020-12-31 ENCOUNTER — Other Ambulatory Visit: Payer: Self-pay

## 2021-01-04 MED FILL — Sodium Bicarbonate Tab 650 MG: ORAL | 30 days supply | Qty: 180 | Fill #0 | Status: AC

## 2021-01-05 ENCOUNTER — Other Ambulatory Visit: Payer: Self-pay

## 2021-01-06 ENCOUNTER — Other Ambulatory Visit: Payer: Self-pay

## 2021-01-11 ENCOUNTER — Encounter: Payer: Self-pay | Admitting: Internal Medicine

## 2021-02-05 ENCOUNTER — Encounter: Payer: Self-pay | Admitting: Internal Medicine

## 2021-02-05 ENCOUNTER — Other Ambulatory Visit: Payer: Self-pay | Admitting: Internal Medicine

## 2021-02-05 DIAGNOSIS — E785 Hyperlipidemia, unspecified: Secondary | ICD-10-CM

## 2021-02-05 DIAGNOSIS — E1069 Type 1 diabetes mellitus with other specified complication: Secondary | ICD-10-CM

## 2021-02-05 MED FILL — Insulin Pen Needle 32 G X 4 MM (1/6" or 5/32"): 25 days supply | Qty: 100 | Fill #0 | Status: AC

## 2021-02-05 MED FILL — Calcitriol Cap 0.25 MCG: ORAL | 30 days supply | Qty: 30 | Fill #0 | Status: AC

## 2021-02-06 ENCOUNTER — Other Ambulatory Visit: Payer: Self-pay

## 2021-02-06 MED ORDER — ATORVASTATIN CALCIUM 10 MG PO TABS
ORAL_TABLET | Freq: Every day | ORAL | 2 refills | Status: DC
  Filled 2021-02-06: qty 30, 30d supply, fill #0
  Filled 2021-03-12: qty 30, 30d supply, fill #1

## 2021-02-06 MED ORDER — FLUTICASONE PROPIONATE 50 MCG/ACT NA SUSP
2.0000 | Freq: Every day | NASAL | 6 refills | Status: DC
  Filled 2021-02-06 – 2021-02-13 (×2): qty 16, 30d supply, fill #0
  Filled 2021-11-27: qty 16, fill #0
  Filled 2021-11-27: qty 16, 30d supply, fill #0

## 2021-02-07 ENCOUNTER — Other Ambulatory Visit: Payer: Self-pay

## 2021-02-13 ENCOUNTER — Other Ambulatory Visit: Payer: Self-pay

## 2021-02-15 ENCOUNTER — Encounter: Payer: Self-pay | Admitting: Internal Medicine

## 2021-02-15 ENCOUNTER — Ambulatory Visit (INDEPENDENT_AMBULATORY_CARE_PROVIDER_SITE_OTHER): Payer: BLUE CROSS/BLUE SHIELD | Admitting: Internal Medicine

## 2021-02-15 ENCOUNTER — Other Ambulatory Visit: Payer: Self-pay

## 2021-02-15 VITALS — BP 124/80 | HR 93 | Ht 61.0 in | Wt 179.1 lb

## 2021-02-15 DIAGNOSIS — E103413 Type 1 diabetes mellitus with severe nonproliferative diabetic retinopathy with macular edema, bilateral: Secondary | ICD-10-CM | POA: Diagnosis not present

## 2021-02-15 DIAGNOSIS — E1042 Type 1 diabetes mellitus with diabetic polyneuropathy: Secondary | ICD-10-CM

## 2021-02-15 DIAGNOSIS — N1832 Chronic kidney disease, stage 3b: Secondary | ICD-10-CM

## 2021-02-15 DIAGNOSIS — Z940 Kidney transplant status: Secondary | ICD-10-CM | POA: Diagnosis not present

## 2021-02-15 DIAGNOSIS — E1021 Type 1 diabetes mellitus with diabetic nephropathy: Secondary | ICD-10-CM | POA: Diagnosis not present

## 2021-02-15 LAB — POCT GLYCOSYLATED HEMOGLOBIN (HGB A1C): Hemoglobin A1C: 7.4 % — AB (ref 4.0–5.6)

## 2021-02-15 LAB — POCT GLUCOSE (DEVICE FOR HOME USE): POC Glucose: 72 mg/dl (ref 70–99)

## 2021-02-15 MED ORDER — INSULIN GLARGINE 100 UNIT/ML SOLOSTAR PEN
30.0000 [IU] | PEN_INJECTOR | Freq: Every day | SUBCUTANEOUS | 3 refills | Status: DC
  Filled 2021-02-15: qty 30, 10d supply, fill #0
  Filled 2021-06-28: qty 30, 10d supply, fill #1

## 2021-02-15 NOTE — Progress Notes (Signed)
Name: Cathy Green  Age/ Sex: 40 y.o., female   MRN/ DOB: [id number], November 16, 1980     PCP: Ladell Pier, MD   Reason for Endocrinology Evaluation: Type 1 Diabetes Mellitus  Initial Endocrine Consultative Visit: 11/14/2020    PATIENT IDENTIFIER: Cathy Green is a 40 y.o. female with a past medical history of T1DM , HTN and Dyslipidemia and hx of renal transplant. The patient has followed with Endocrinology clinic since 11/14/2020 for consultative assistance with management of her diabetes.  DIABETIC HISTORY:  Cathy Green was diagnosed with DM at age 60, Metformin caused GI side effects .Marland Kitchen Her hemoglobin A1c has ranged from 6.2% in 01/2020, peaking at 8.1% in 08/2020.  S/P renal transplant 06/2020- Through [facility]  No hx of dialysis    On her initial visit to our clinic she had an A1c of 6.5 %, we adjusted MDI regimen and provided her with correction scale  SUBJECTIVE:   During the last visit (11/14/2020 ): A1c 6.5% adjusted MDi regimen      Today (02/15/2021): Cathy Green is here for a follow up on diabetes.  She checks her blood sugars multiple times a day through . The patient has had hypoglycemic episodes since the last clinic visit, which typically occur during the day at work.The patient is  symptomatic with these episodes, hand tremors.   Denies nausea or vomiting    HOME DIABETES REGIMEN:  Lantus 32 units daily  Novolog 10 units with each meal  CF: Novolog ( BG -140/25)      Statin: yes ACE-I/ARB: no Prior Diabetic Education: yes   CGM: Unable to download Dexcom  DIABETIC COMPLICATIONS: Microvascular complications:   Neuropathy, CKD (S/P transplant), retinopathy ( S/P injections)  Last eye exam: Completed 08/2020   Macrovascular complications:    Denies: CAD, CVA, PVD   HISTORY:  Past Medical History:  Past Medical History:  Diagnosis Date  . Abnormal Pap smear   . Allergy    seasonal allergies  . Anemia   . Chronic kidney disease    . Diabetes mellitus    diagnosed age 7  . Diabetes mellitus without complication ([place])   . Glaucoma   . Hyperlipidemia   . Hypertension   . Migraines   . Noncompliance   . Numbness in left leg    Past Surgical History:  Past Surgical History:  Procedure Laterality Date  . KIDNEY TRANSPLANT  06/2020  . WISDOM TOOTH EXTRACTION      Social History:  reports that she has never smoked. She has never used smokeless tobacco. She reports current alcohol use. She reports that she does not use drugs. Family History:  Family History  Problem Relation Age of Onset  . Cancer Maternal Grandfather        prostate  . Hypertension Mother   . Stroke Mother   . Cancer Other   . COPD Other   . Hyperlipidemia Other   . Hypertension Father      HOME MEDICATIONS: Allergies as of 02/15/2021      Reactions   Garlic Nausea And Vomiting      Medication List       Accurate as of Feb 15, 2021  2:56 PM. If you have any questions, ask your nurse or doctor.        STOP taking these medications   valGANciclovir 450 MG tablet Commonly known as: VALCYTE Stopped by: Dorita Sciara, MD     TAKE these medications   amLODipine 5  MG tablet Commonly known as: NORVASC Take 1 tablet (5 mg total) by mouth daily.   atorvastatin 10 MG tablet Commonly known as: LIPITOR TAKE 1 TABLET (10 MG TOTAL) BY MOUTH DAILY.   calcitRIOL 0.25 MCG capsule Commonly known as: ROCALTROL TAKE 1 CAPSULE (0.25 MCG TOTAL) BY MOUTH DAILY.   Contour Next Monitor w/Device Kit USE AS INSTRUCTED TO CHECK BLOOD SUGAR 3 TIMES DAILY   Contour Next Test test strip Generic drug: glucose blood USE AS INSTRUCTED   Contour Next Test test strip Generic drug: glucose blood Use as instructed to check blood sugar TID.   cyanocobalamin 1000 MCG tablet Take by mouth.   Dexcom G6 Sensor Misc 1 Device by Does not apply route as directed.   Dexcom G6 Transmitter Misc 1 Device by Does not apply route as directed.    fluticasone 50 MCG/ACT nasal spray Commonly known as: FLONASE Place 2 sprays into both nostrils daily.   gabapentin 300 MG capsule Commonly known as: NEURONTIN TAKE 1 CAPSULE (300 MG TOTAL) BY MOUTH 2 (TWO) TIMES DAILY.   Lantus SoloStar 100 UNIT/ML Solostar Pen Generic drug: insulin glargine INJECT 36 UNITS INTO THE SKIN NIGHTLY.   Lantus SoloStar 100 UNIT/ML Solostar Pen Generic drug: insulin glargine INJECT 32 UNITS INTO THE SKIN AT BEDTIME.   latanoprost 0.005 % ophthalmic solution Commonly known as: XALATAN INSTILL 1 DROP INTO BOTH EYES AT BEDTIME   Microlet Lancets Misc USE AS INSTRUCTED TO CHECK BLOOD SUGAR 3 TIMES DAILY   mycophenolate 180 MG EC tablet Commonly known as: MYFORTIC Take by mouth.   mycophenolate 180 MG EC tablet Commonly known as: MYFORTIC TAKE 2 TABLETS (360 MG TOTAL) BY MOUTH 2 TIMES DAILY.   NovoLOG FlexPen 100 UNIT/ML FlexPen Generic drug: insulin aspart INJECT 5 UNITS INTO THE SKIN 3 TIMES DAILY BEFORE MEALS.   NovoLOG FlexPen 100 UNIT/ML FlexPen Generic drug: insulin aspart INJECT 10 UNITS INTO THE SKIN 3 (THREE) TIMES DAILY WITH MEALS. MAX DAILY 50 UNITS   predniSONE 5 MG tablet Commonly known as: DELTASONE Take 5 mg by mouth daily with breakfast.   sevelamer carbonate 800 MG tablet Commonly known as: RENVELA Take 800 mg by mouth 2 (two) times daily.   sodium bicarbonate 650 MG tablet TAKE 2 TABLETS (1,300 MG TOTAL) BY MOUTH 3 (THREE) TIMES DAILY.   tacrolimus 1 MG capsule Commonly known as: PROGRAF Take 4 capsules in the morning and 3 capsules at night   tacrolimus 1 MG capsule Commonly known as: PROGRAF TAKE 3 CAPSULES IN THE MORNING AND 3 CAPSULES AT NIGHT   TRUEplus Pen Needles 31G X 6 MM Misc Generic drug: Insulin Pen Needle USE 4 TIMES DAILY BEFORE MEALS AND NIGHTLY   TRUEplus Pen Needles 32G X 4 MM Misc Generic drug: Insulin Pen Needle 1 DEVICE BY DOES NOT APPLY ROUTE IN THE MORNING, AT NOON, IN THE EVENING, AND AT  BEDTIME.        OBJECTIVE:   Vital Signs: BP 124/80   Pulse 93   Ht '5\' 1"'  (1.549 m)   Wt 179 lb 2 oz (81.3 kg)   SpO2 99%   BMI 33.85 kg/m   Wt Readings from Last 3 Encounters:  02/15/21 179 lb 2 oz (81.3 kg)  11/14/20 175 lb 2 oz (79.4 kg)  08/02/20 166 lb 12.8 oz (75.7 kg)     Exam: General: Pt appears well and is in NAD  Neck: General: Supple without adenopathy. Thyroid: Thyroid size normal.  No goiter or nodules  appreciated.  Lungs: Clear with good BS bilat with no rales, rhonchi, or wheezes  Heart: RRR with normal S1 and S2 and no gallops; no murmurs; no rub  Abdomen: Normoactive bowel sounds, soft, nontender, without masses or organomegaly palpable  Extremities: No pretibial edema.   Neuro: MS is good with appropriate affect, pt is alert and Ox3   DM foot exam: 11/14/2020  The skin of the feet is intact without sores or ulcerations. The pedal pulses are 2+ on right and 2+ on left. The sensation is intact to a screening 5.07, 10 gram monofilament bilaterally        DATA REVIEWED:  Lab Results  Component Value Date   HGBA1C 7.4 (A) 02/15/2021   HGBA1C 6.5 (A) 11/14/2020   HGBA1C 8.1 (A) 08/02/2020   Lab Results  Component Value Date   MICROALBUR 79.7 12/27/2015   LDLCALC 153 (H) 11/04/2018   CREATININE 4.65 (H) 08/15/2019   Lab Results  Component Value Date   MICRALBCREAT 1,064.8 (H) 05/27/2018     Lab Results  Component Value Date   CHOL 221 (H) 11/04/2018   HDL 48 11/04/2018   LDLCALC 153 (H) 11/04/2018   TRIG 101 11/04/2018   CHOLHDL 4.6 11/04/2018         ASSESSMENT / PLAN / RECOMMENDATIONS:   1) Type 1 Diabetes Mellitus, Sub-Optimally controlled, With neuropathic, retinopathic complications and S/P renal transplant    complications - Most recent A1c of 7.4  %. Goal A1c < 7.0 %.    -A1c increased from 6.5% -We have not been able to download Dexcom today as she has not been sharing data, she was provided with an email to start  sharing data with our office -I have manually reviewed the last 24-hour of the Dexcom data, patient tends to have hypoglycemia during the day and hyperglycemia at the end of the day.  I am going to reduce her basal insulin as well as adjust prandial insulin as below  -She has been snacking in between those meals and not bolusing, patient advised that with the current changes she may end up with hypoglycemia if she snacks and not take bolus.  MEDICATIONS: - Decrease Lantus to 30 units daily  - Decrease Novolog 8 units with Breakfast , 10 units with Lunch and 12 units with Supper -CF: NovoLog (BG -130/35)  EDUCATION / INSTRUCTIONS:  BG monitoring instructions: Patient is instructed to check her blood sugars 3 times a day, before meals   Call [facility] if: BG persistently < 70  . I reviewed the Rule of 15 for the treatment of hypoglycemia in detail with the patient. Literature supplied.    2) Diabetic complications:   Eye: Does  have known diabetic retinopathy.   Neuro/ Feet: Does have known diabetic peripheral neuropathy .   Renal: Patient does  have known baseline CKD, she is status post renal transplant. She   is not on an ACEI/ARB at present.    3) Dyslipidemia: Patient is  on Atorvastatin  F/U in 4 months   Signed electronically by: Mack Guise, MD  [facility]  [facility] [address] [place], [place] [address] Phone: [phone] FAX: [phone]   CC: Ladell Pier, MD [place] [address] Phone: [phone]  Fax: [phone]  Return to Endocrinology clinic as below: No future appointments.

## 2021-02-15 NOTE — Patient Instructions (Addendum)
-   Decrease Lantus to 30 units daily  - Decrease Novolog 8 units with Breakfast , 10 units with Lunch and 12 units with Supper   - Novolog correctional insulin: ADD extra units on insulin to your meal-time Novolog dose if your blood sugars are higher than 165. Use the scale below to help guide you:   Blood sugar before meal Number of units to inject  Less than 165 0 unit  166 -  190 1 units  191 -  215 2 units  216 -  240 3 units  241 -  265 4 units  266 -  290 5 units  291 -  315 6 units  316 -  340 7 units  341 -  365 8 units  366- 390 9 units      HOW TO TREAT LOW BLOOD SUGARS (Blood sugar LESS THAN 70 MG/DL)  Please follow the RULE OF 15 for the treatment of hypoglycemia treatment (when your (blood sugars are less than 70 mg/dL)    STEP 1: Take 15 grams of carbohydrates when your blood sugar is low, which includes:   3-4 GLUCOSE TABS  OR  3-4 OZ OF JUICE OR REGULAR SODA OR  ONE TUBE OF GLUCOSE GEL     STEP 2: RECHECK blood sugar in 15 MINUTES STEP 3: If your blood sugar is still low at the 15 minute recheck --> then, go back to STEP 1 and treat AGAIN with another 15 grams of carbohydrates.

## 2021-02-16 ENCOUNTER — Other Ambulatory Visit: Payer: Self-pay

## 2021-02-19 MED FILL — Sodium Bicarbonate Tab 650 MG: ORAL | 30 days supply | Qty: 180 | Fill #1 | Status: AC

## 2021-02-20 ENCOUNTER — Other Ambulatory Visit: Payer: Self-pay

## 2021-03-12 ENCOUNTER — Other Ambulatory Visit: Payer: Self-pay

## 2021-03-12 MED FILL — Insulin Pen Needle 32 G X 4 MM (1/6" or 5/32"): 25 days supply | Qty: 100 | Fill #1 | Status: AC

## 2021-03-13 ENCOUNTER — Other Ambulatory Visit: Payer: Self-pay

## 2021-03-15 ENCOUNTER — Other Ambulatory Visit: Payer: Self-pay

## 2021-03-20 ENCOUNTER — Other Ambulatory Visit: Payer: Self-pay | Admitting: Internal Medicine

## 2021-03-20 DIAGNOSIS — D849 Immunodeficiency, unspecified: Secondary | ICD-10-CM

## 2021-03-20 DIAGNOSIS — Z940 Kidney transplant status: Secondary | ICD-10-CM

## 2021-03-20 NOTE — Telephone Encounter (Signed)
Notes to clinic: Medications filled by historical provider  Review for continued use and refill    Requested Prescriptions  Pending Prescriptions Disp Refills   predniSONE (DELTASONE) 5 MG tablet [Pharmacy Med Name: predniSONE 5 mg tablet (DELTASONE)] 30 tablet 1    Sig: Take 1 tablet (5 mg) by mouth once daily or as directed by transplant clinic.      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, Annie Main L, RPH-CPP                  mycophenolate (MYFORTIC) 180 MG EC tablet [Pharmacy Med Name: mycophenolate sodium 180 mg tablet,delayed release (MYFORTIC)] 240 tablet 1    Sig: Take 4 tablets (720 mg total) by mouth 2 times daily.      Not Delegated - Immunology:  Immunosuppressive Agents Failed - 03/20/2021  3:16 PM      Failed - This refill cannot be delegated      Failed - WBC in normal range and within 30 days    WBC  Date Value Ref Range Status  11/12/2018 5.0 3.4 - 10.8 x10E3/uL Final  11/05/2018  4.4 4.0 - 10.5 K/uL Final          Failed - HGB in normal range and within 30 days    Hemoglobin  Date Value Ref Range Status  11/12/2018 8.9 (L) 11.1 - 15.9 g/dL Final   HGB  Date Value Ref Range Status  09/07/2011 11.6 11.6 - 15.9 g/dL Final   Hemoglobin Other  Date Value Ref Range Status  09/07/2011 0.0 0.0 % Final    Comment:     Interpretation--------------Normal study. Reviewed by Odis Hollingshead, MD, PhD, FCAP (Electronic Signature onFile)          Failed - HCT in normal range and within 30 days    HCT  Date Value Ref Range Status  09/07/2011 34.7 (L) 34.8 - 46.6 % Final   Hematocrit  Date Value Ref Range Status  11/12/2018 28.3 (L) 34.0 - 46.6 % Final          Failed - PLT in normal range and within 30 days    Platelets  Date Value Ref Range Status  11/12/2018 217 150 - 450 x10E3/uL Final          Failed - ALT in normal range  and within 30 days    ALT  Date Value Ref Range Status  11/12/2018 13 0 - 32 IU/L Final          Failed - AST in normal range and within 30 days    AST  Date Value Ref Range Status  11/12/2018 11 0 - 40 IU/L Final          Passed - Valid encounter within last 6 months    Recent Outpatient Visits           3 months ago Hypertensive kidney disease   [place], Neoma Laming B, MD   7 months ago 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

## 2021-03-21 ENCOUNTER — Other Ambulatory Visit: Payer: Self-pay

## 2021-03-28 ENCOUNTER — Other Ambulatory Visit: Payer: Self-pay

## 2021-03-28 ENCOUNTER — Other Ambulatory Visit: Payer: Self-pay | Admitting: Internal Medicine

## 2021-03-29 ENCOUNTER — Other Ambulatory Visit: Payer: Self-pay

## 2021-03-30 ENCOUNTER — Other Ambulatory Visit: Payer: Self-pay

## 2021-04-04 ENCOUNTER — Other Ambulatory Visit: Payer: Self-pay

## 2021-04-04 ENCOUNTER — Other Ambulatory Visit: Payer: Self-pay | Admitting: Internal Medicine

## 2021-04-04 DIAGNOSIS — E1042 Type 1 diabetes mellitus with diabetic polyneuropathy: Secondary | ICD-10-CM

## 2021-04-04 MED ORDER — GABAPENTIN 300 MG PO CAPS
ORAL_CAPSULE | Freq: Two times a day (BID) | ORAL | 0 refills | Status: DC
  Filled 2021-04-04: qty 180, 90d supply, fill #0

## 2021-04-04 MED ORDER — CIPROFLOXACIN HCL 500 MG PO TABS
ORAL_TABLET | ORAL | 0 refills | Status: DC
  Filled 2021-04-04: qty 14, 7d supply, fill #0

## 2021-04-04 MED FILL — Sodium Bicarbonate Tab 650 MG: ORAL | 30 days supply | Qty: 180 | Fill #2 | Status: AC

## 2021-04-04 MED FILL — Insulin Aspart Soln Pen-injector 100 Unit/ML: SUBCUTANEOUS | 60 days supply | Qty: 30 | Fill #0 | Status: AC

## 2021-04-05 ENCOUNTER — Other Ambulatory Visit: Payer: Self-pay

## 2021-05-21 MED FILL — Latanoprost Ophth Soln 0.005%: OPHTHALMIC | 50 days supply | Qty: 2.5 | Fill #0 | Status: AC

## 2021-05-21 MED FILL — Sodium Bicarbonate Tab 650 MG: ORAL | 30 days supply | Qty: 180 | Fill #3 | Status: AC

## 2021-05-22 ENCOUNTER — Other Ambulatory Visit: Payer: Self-pay

## 2021-05-25 ENCOUNTER — Other Ambulatory Visit: Payer: Self-pay

## 2021-06-14 DIAGNOSIS — B259 Cytomegaloviral disease, unspecified: Secondary | ICD-10-CM | POA: Insufficient documentation

## 2021-06-21 ENCOUNTER — Ambulatory Visit (INDEPENDENT_AMBULATORY_CARE_PROVIDER_SITE_OTHER): Payer: BLUE CROSS/BLUE SHIELD | Admitting: Internal Medicine

## 2021-06-21 ENCOUNTER — Other Ambulatory Visit: Payer: Self-pay

## 2021-06-21 ENCOUNTER — Encounter: Payer: Self-pay | Admitting: Internal Medicine

## 2021-06-21 VITALS — BP 126/80 | HR 90 | Ht 61.0 in | Wt 173.2 lb

## 2021-06-21 DIAGNOSIS — E785 Hyperlipidemia, unspecified: Secondary | ICD-10-CM | POA: Diagnosis not present

## 2021-06-21 DIAGNOSIS — Z940 Kidney transplant status: Secondary | ICD-10-CM | POA: Diagnosis not present

## 2021-06-21 DIAGNOSIS — E1042 Type 1 diabetes mellitus with diabetic polyneuropathy: Secondary | ICD-10-CM

## 2021-06-21 DIAGNOSIS — E103413 Type 1 diabetes mellitus with severe nonproliferative diabetic retinopathy with macular edema, bilateral: Secondary | ICD-10-CM | POA: Diagnosis not present

## 2021-06-21 DIAGNOSIS — E1069 Type 1 diabetes mellitus with other specified complication: Secondary | ICD-10-CM | POA: Diagnosis not present

## 2021-06-21 LAB — LIPID PANEL
Cholesterol: 200 mg/dL (ref 0–200)
HDL: 41.7 mg/dL (ref >39.00)
NonHDL: 158.59
Total CHOL/HDL Ratio: 5
Triglycerides: 279 mg/dL — ABNORMAL HIGH (ref 0.0–149.0)
VLDL: 55.8 mg/dL — ABNORMAL HIGH (ref 0.0–40.0)

## 2021-06-21 LAB — POCT GLYCOSYLATED HEMOGLOBIN (HGB A1C): Hemoglobin A1C: 6.4 % — AB (ref 4.0–5.6)

## 2021-06-21 LAB — LDL CHOLESTEROL, DIRECT: Direct LDL: 121 mg/dL

## 2021-06-21 LAB — TSH: TSH: 1.03 u[IU]/mL (ref 0.35–5.50)

## 2021-06-21 LAB — MICROALBUMIN / CREATININE URINE RATIO
Creatinine,U: 226.6 mg/dL
Microalb Creat Ratio: 6.4 mg/g (ref 0.0–30.0)
Microalb, Ur: 14.6 mg/dL — ABNORMAL HIGH (ref 0.0–1.9)

## 2021-06-21 NOTE — Progress Notes (Signed)
Name: Cathy Green  Age/ Sex: 40 y.o., female   MRN/ DOB: [id number], 07-23-1981     PCP: Ladell Pier, MD   Reason for Endocrinology Evaluation: Type 1 Diabetes Mellitus  Initial Endocrine Consultative Visit: 11/14/2020    PATIENT IDENTIFIER: Cathy Green is a 40 y.o. female with a past medical history of T1DM , HTN and Dyslipidemia and hx of renal transplant. The patient has followed with Endocrinology clinic since 11/14/2020 for consultative assistance with management of her diabetes.  DIABETIC HISTORY:  Cathy Green was diagnosed with DM at age 57, Metformin caused GI side effects .Marland Kitchen Her hemoglobin A1c has ranged from 6.2% in 01/2020, peaking at 8.1% in 08/2020.  S/P renal transplant 06/2020- Through [facility]  No hx of dialysis    On her initial visit to our clinic she had an A1c of 6.5 %, we adjusted MDI regimen and provided her with correction scale   SUBJECTIVE:   During the last visit (11/14/2020 ): A1c 6.5% adjusted MDi regimen      Today (06/21/2021): Cathy Green is here for a follow up on diabetes.  She checks her blood sugars multiple times a day through CGM. The patient has  had hypoglycemic episodes since the last clinic visit, which typically occur during the night.The patient is  symptomatic with these episodes, hand tremors.   Denies nausea or vomiting    HOME DIABETES REGIMEN:  Lantus 30 units daily - has been taking 35 units  Novolog 8 units with Breakfast, 10 units with lunch and 12 units with Supper  CF: Novolog ( BG -130/35)      Statin: yes ACE-I/ARB: no Prior Diabetic Education: yes    CONTINUOUS GLUCOSE MONITORING RECORD INTERPRETATION    Dates of Recording: 9/8-9/21/2022  Sensor description:dexcom  Results statistics:   CGM use % of time 100  Average and SD 146/52  Time in range    77    %  % Time Above 180 17  % Time above 250 4  % Time Below target 1      Glycemic patterns summary: Bg's optimal overnight and high  during the day   Hyperglycemic episodes   during the day , post prandial  Hypoglycemic episodes occurred at night  Overnight periods: trends down      DIABETIC COMPLICATIONS: Microvascular complications:  Neuropathy, CKD (S/P transplant), retinopathy ( S/P injections) Last eye exam: Completed 08/2020   Macrovascular complications:   Denies: CAD, CVA, PVD   HISTORY:  Past Medical History:  Past Medical History:  Diagnosis Date   Abnormal Pap smear    Allergy    seasonal allergies   Anemia    Chronic kidney disease    Diabetes mellitus    diagnosed age 9   Diabetes mellitus without complication ([place])    Glaucoma    Hyperlipidemia    Hypertension    Migraines    Noncompliance    Numbness in left leg    Past Surgical History:  Past Surgical History:  Procedure Laterality Date   KIDNEY TRANSPLANT  06/2020   WISDOM TOOTH EXTRACTION     Social History:  reports that she has never smoked. She has never used smokeless tobacco. She reports current alcohol use. She reports that she does not use drugs. Family History:  Family History  Problem Relation Age of Onset   Cancer Maternal Grandfather        prostate   Hypertension Mother    Stroke Mother  Cancer Other    COPD Other    Hyperlipidemia Other    Hypertension Father      HOME MEDICATIONS: Allergies as of 06/21/2021       Reactions   Garlic Nausea And Vomiting        Medication List        Accurate as of June 21, 2021  2:45 PM. If you have any questions, ask your nurse or doctor.          amLODipine 5 MG tablet Commonly known as: NORVASC Take 1 tablet (5 mg total) by mouth daily.   atorvastatin 10 MG tablet Commonly known as: LIPITOR TAKE 1 TABLET (10 MG TOTAL) BY MOUTH DAILY.   TRUEplus Pen Needles 31G X 6 MM Misc Generic drug: Insulin Pen Needle USE 4 TIMES DAILY BEFORE MEALS AND NIGHTLY   BD Pen Needle Nano 2nd Gen 32G X 4 MM Misc Generic drug: Insulin Pen Needle 1  DEVICE BY DOES NOT APPLY ROUTE IN THE MORNING, AT NOON, IN THE EVENING, AND AT BEDTIME.   ciprofloxacin 500 MG tablet Commonly known as: CIPRO Take 1 tablet (500 mg total) by mouth 2 times daily for 7 days.   Contour Next Monitor w/Device Kit USE AS INSTRUCTED TO CHECK BLOOD SUGAR 3 TIMES DAILY   Contour Next Test test strip Generic drug: glucose blood USE AS INSTRUCTED   Contour Next Test test strip Generic drug: glucose blood Use as instructed to check blood sugar TID.   cyanocobalamin 1000 MCG tablet Take by mouth.   Dexcom G6 Sensor Misc 1 Device by Does not apply route as directed.   Dexcom G6 Transmitter Misc 1 Device by Does not apply route as directed.   fluticasone 50 MCG/ACT nasal spray Commonly known as: FLONASE Place 2 sprays into both nostrils daily.   gabapentin 300 MG capsule Commonly known as: NEURONTIN Take 1 capsule by mouth 2 (two) times daily.   Lantus SoloStar 100 UNIT/ML Solostar Pen Generic drug: insulin glargine INJECT 36 UNITS INTO THE SKIN NIGHTLY.   Lantus SoloStar 100 UNIT/ML Solostar Pen Generic drug: insulin glargine Inject 30 Units into the skin daily.   latanoprost 0.005 % ophthalmic solution Commonly known as: XALATAN INSTILL 1 DROP INTO BOTH EYES AT BEDTIME   Microlet Lancets Misc USE AS INSTRUCTED TO CHECK BLOOD SUGAR 3 TIMES DAILY   mycophenolate 180 MG EC tablet Commonly known as: MYFORTIC Take by mouth.   mycophenolate 180 MG EC tablet Commonly known as: MYFORTIC TAKE 2 TABLETS (360 MG TOTAL) BY MOUTH 2 TIMES DAILY.   NovoLOG FlexPen 100 UNIT/ML FlexPen Generic drug: insulin aspart INJECT 5 UNITS INTO THE SKIN 3 TIMES DAILY BEFORE MEALS.   NovoLOG FlexPen 100 UNIT/ML FlexPen Generic drug: insulin aspart INJECT 10 UNITS INTO THE SKIN 3 (THREE) TIMES DAILY WITH MEALS. MAX DAILY 50 UNITS   predniSONE 5 MG tablet Commonly known as: DELTASONE Take 5 mg by mouth daily with breakfast.   sevelamer carbonate 800 MG  tablet Commonly known as: RENVELA Take 800 mg by mouth 2 (two) times daily.   sodium bicarbonate 650 MG tablet TAKE 2 TABLETS (1,300 MG TOTAL) BY MOUTH 3 (THREE) TIMES DAILY.   tacrolimus 1 MG capsule Commonly known as: PROGRAF Take 4 capsules in the morning and 3 capsules at night   tacrolimus 1 MG capsule Commonly known as: PROGRAF TAKE 3 CAPSULES IN THE MORNING AND 3 CAPSULES AT NIGHT         OBJECTIVE:   Vital  Signs: BP 126/80 (BP Location: Left Arm, Patient Position: Sitting, Cuff Size: Small)   Pulse 90   Ht '5\' 1"'  (1.549 m)   Wt 173 lb 3.2 oz (78.6 kg)   SpO2 98%   BMI 32.73 kg/m   Wt Readings from Last 3 Encounters:  06/21/21 173 lb 3.2 oz (78.6 kg)  02/15/21 179 lb 2 oz (81.3 kg)  11/14/20 175 lb 2 oz (79.4 kg)     Exam: General: Pt appears well and is in NAD  Neck: General: Supple without adenopathy. Thyroid: Thyroid size normal.  No goiter or nodules appreciated.  Lungs: Clear with good BS bilat with no rales, rhonchi, or wheezes  Heart: RRR with normal S1 and S2 and no gallops; no murmurs; no rub  Abdomen: Normoactive bowel sounds, soft, nontender, without masses or organomegaly palpable  Extremities: No pretibial edema.   Neuro: MS is good with appropriate affect, pt is alert and Ox3   DM foot exam: 11/14/2020   The skin of the feet is intact without sores or ulcerations. The pedal pulses are 2+ on right and 2+ on left. The sensation is intact to a screening 5.07, 10 gram monofilament bilaterally         DATA REVIEWED:  Lab Results  Component Value Date   HGBA1C 6.4 (A) 06/21/2021   HGBA1C 7.4 (A) 02/15/2021   HGBA1C 6.5 (A) 11/14/2020   Results for Cathy Green, Cathy Green (MRN [id number]) as of 06/23/2021 12:07  Ref. Range 06/21/2021 14:59  Total CHOL/HDL Ratio Unknown 5  Cholesterol Latest Ref Range: 0 - 200 mg/dL 200  HDL Cholesterol Latest Ref Range: >39.00 mg/dL 41.70  Direct LDL Latest Units: mg/dL 121.0  MICROALB/CREAT RATIO Latest Ref  Range: 0.0 - 30.0 mg/g 6.4  NonHDL Unknown 158.59  Triglycerides Latest Ref Range: 0.0 - 149.0 mg/dL 279.0 (H)  VLDL Latest Ref Range: 0.0 - 40.0 mg/dL 55.8 (H)  TSH Latest Ref Range: 0.35 - 5.50 uIU/mL 1.03  Creatinine,U Latest Units: mg/dL 226.6  Microalb, Ur Latest Ref Range: 0.0 - 1.9 mg/dL 14.6 (H)    06/16/2021  Na 138 K 5.2 BUN/Cr 25/1.90   ASSESSMENT / PLAN / RECOMMENDATIONS:   1) Type 1 Diabetes Mellitus, Optimally controlled, With neuropathic, retinopathic complications and S/P renal transplant   - Most recent A1c of 6.4  %. Goal A1c < 7.0 %.    - She has been taking higher basal insulin dose then previously prescribed, hence overnight hypoglycemia, will reduce lantus  - She has also been noted with hyperglycemia during the day, will increase prandial dose with lunch as below  - She was encouraged to use the correction scale when needed  - She was advised to check out T-slim and Omnipod    MEDICATIONS: - Decrease Lantus to 30 units daily  - Change  Novolog 8 units with Breakfast , 12 units with Lunch and 12 units with Supper -CF: NovoLog (BG -130/35)  EDUCATION / INSTRUCTIONS: BG monitoring instructions: Patient is instructed to check her blood sugars 3 times a day, before meals  Call [facility] if: BG persistently < 70  I reviewed the Rule of 15 for the treatment of hypoglycemia in detail with the patient. Literature supplied.    2) Diabetic complications:  Eye: Does  have known diabetic retinopathy.  Neuro/ Feet: Does have known diabetic peripheral neuropathy .  Renal: Patient does  have known baseline CKD, she is status post renal transplant. She   is not on an ACEI/ARB at  present.    3) Dyslipidemia: Patient is  on Atorvastatin  - Lipid panel today shows elevated LDL and TG, will counsel about low-fat diet, will increase atorvastatin   Medication Stop atorvastatin 10 mg daily Start atorvastatin 20 mg daily    F/U in 4  months   Signed electronically by: Mack Guise, MD  [facility]  [facility] [address] [place], [place] [address] Phone: [phone] FAX: [phone]   CC: Ladell Pier, [place] [address] Phone: [phone]  Fax: [phone]  Return to Endocrinology clinic as below: No future appointments.

## 2021-06-21 NOTE — Patient Instructions (Addendum)
-   Decrease Lantus to 30 units daily  - Change Novolog 8 units with Breakfast , 12 units with Lunch and 12 units with Supper   - Novolog correctional insulin: ADD extra units on insulin to your meal-time Novolog dose if your blood sugars are higher than 165. Use the scale below to help guide you:   Blood sugar before meal Number of units to inject  Less than 165 0 unit  166 -  190 1 units  191 -  215 2 units  216 -  240 3 units  241 -  265 4 units  266 -  290 5 units  291 -  315 6 units  316 -  340 7 units  341 -  365 8 units  366- 390 9 units      Check out T-slim and Omnipod insulin pumps   HOW TO TREAT LOW BLOOD SUGARS (Blood sugar LESS THAN 70 MG/DL) Please follow the RULE OF 15 for the treatment of hypoglycemia treatment (when your (blood sugars are less than 70 mg/dL)   STEP 1: Take 15 grams of carbohydrates when your blood sugar is low, which includes:  3-4 GLUCOSE TABS  OR 3-4 OZ OF JUICE OR REGULAR SODA OR ONE TUBE OF GLUCOSE GEL    STEP 2: RECHECK blood sugar in 15 MINUTES STEP 3: If your blood sugar is still low at the 15 minute recheck --> then, go back to STEP 1 and treat AGAIN with another 15 grams of carbohydrates.

## 2021-06-23 ENCOUNTER — Other Ambulatory Visit: Payer: Self-pay

## 2021-06-23 DIAGNOSIS — Z23 Encounter for immunization: Secondary | ICD-10-CM | POA: Insufficient documentation

## 2021-06-23 MED ORDER — ATORVASTATIN CALCIUM 20 MG PO TABS
20.0000 mg | ORAL_TABLET | Freq: Every day | ORAL | 3 refills | Status: DC
Start: 2021-06-23 — End: 2022-07-19
  Filled 2021-06-23: qty 90, 90d supply, fill #0
  Filled 2021-10-01: qty 90, 90d supply, fill #1
  Filled 2022-01-12: qty 90, 90d supply, fill #0
  Filled 2022-04-14: qty 90, 90d supply, fill #1

## 2021-06-28 ENCOUNTER — Other Ambulatory Visit: Payer: Self-pay

## 2021-06-28 ENCOUNTER — Other Ambulatory Visit: Payer: Self-pay | Admitting: Internal Medicine

## 2021-06-29 ENCOUNTER — Other Ambulatory Visit: Payer: Self-pay

## 2021-06-29 MED ORDER — INSULIN GLARGINE-YFGN 100 UNIT/ML ~~LOC~~ SOLN
30.0000 [IU] | Freq: Every day | SUBCUTANEOUS | 3 refills | Status: DC
  Filled 2021-06-29: qty 20, 66d supply, fill #0
  Filled 2021-09-05 – 2021-09-06 (×2): qty 10, 28d supply, fill #1
  Filled 2021-10-18: qty 10, 28d supply, fill #2
  Filled 2021-10-19: qty 10, 28d supply, fill #0

## 2021-06-30 ENCOUNTER — Other Ambulatory Visit: Payer: Self-pay

## 2021-06-30 ENCOUNTER — Encounter: Payer: Self-pay | Admitting: Internal Medicine

## 2021-08-13 ENCOUNTER — Other Ambulatory Visit: Payer: Self-pay | Admitting: Internal Medicine

## 2021-08-13 ENCOUNTER — Other Ambulatory Visit: Payer: Self-pay | Admitting: Ophthalmology

## 2021-08-13 DIAGNOSIS — E1042 Type 1 diabetes mellitus with diabetic polyneuropathy: Secondary | ICD-10-CM

## 2021-08-13 MED FILL — Insulin Aspart Soln Pen-injector 100 Unit/ML: SUBCUTANEOUS | 60 days supply | Qty: 30 | Fill #1 | Status: AC

## 2021-08-13 MED FILL — Sodium Bicarbonate Tab 650 MG: ORAL | 30 days supply | Qty: 180 | Fill #4 | Status: AC

## 2021-08-14 ENCOUNTER — Other Ambulatory Visit: Payer: Self-pay

## 2021-08-14 MED ORDER — GABAPENTIN 300 MG PO CAPS
ORAL_CAPSULE | Freq: Two times a day (BID) | ORAL | 0 refills | Status: DC
Start: 2021-08-14 — End: 2021-10-18
  Filled 2021-08-14: qty 180, 90d supply, fill #0

## 2021-08-14 NOTE — Telephone Encounter (Signed)
Requested Prescriptions  Pending Prescriptions Disp Refills  . gabapentin (NEURONTIN) 300 MG capsule 180 capsule 0    Sig: Take 1 capsule by mouth 2 (two) times daily.     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

## 2021-08-15 ENCOUNTER — Other Ambulatory Visit: Payer: Self-pay

## 2021-08-17 ENCOUNTER — Other Ambulatory Visit: Payer: Self-pay

## 2021-08-18 ENCOUNTER — Other Ambulatory Visit: Payer: Self-pay

## 2021-08-21 ENCOUNTER — Other Ambulatory Visit: Payer: Self-pay

## 2021-08-21 MED ORDER — SIMBRINZA 1-0.2 % OP SUSP
1.0000 [drp] | Freq: Two times a day (BID) | OPHTHALMIC | 3 refills | Status: DC
  Filled 2021-08-21: qty 8, 32d supply, fill #0
  Filled 2021-08-23 – 2021-11-27 (×3): qty 8, 80d supply, fill #0
  Filled 2022-04-14: qty 8, 64d supply, fill #0

## 2021-08-23 ENCOUNTER — Other Ambulatory Visit: Payer: Self-pay

## 2021-09-06 ENCOUNTER — Other Ambulatory Visit: Payer: Self-pay

## 2021-09-08 ENCOUNTER — Other Ambulatory Visit: Payer: Self-pay

## 2021-10-02 ENCOUNTER — Other Ambulatory Visit: Payer: Self-pay

## 2021-10-02 MED ORDER — SIMBRINZA 1-0.2 % OP SUSP
1.0000 [drp] | Freq: Two times a day (BID) | OPHTHALMIC | 3 refills | Status: DC
  Filled 2021-10-02: qty 8, 32d supply, fill #0

## 2021-10-18 ENCOUNTER — Other Ambulatory Visit: Payer: Self-pay | Admitting: Internal Medicine

## 2021-10-18 DIAGNOSIS — E1042 Type 1 diabetes mellitus with diabetic polyneuropathy: Secondary | ICD-10-CM

## 2021-10-18 MED ORDER — GABAPENTIN 300 MG PO CAPS
ORAL_CAPSULE | Freq: Two times a day (BID) | ORAL | 0 refills | Status: DC
  Filled 2021-10-18 – 2021-11-27 (×3): qty 180, fill #0
  Filled 2021-12-08: qty 180, 90d supply, fill #0

## 2021-10-18 NOTE — Telephone Encounter (Signed)
Requested Prescriptions  Pending Prescriptions Disp Refills   gabapentin (NEURONTIN) 300 MG capsule 180 capsule 0    Sig: Take 1 capsule by mouth 2 (two) times daily.     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

## 2021-10-19 ENCOUNTER — Other Ambulatory Visit: Payer: Self-pay | Admitting: Internal Medicine

## 2021-10-19 ENCOUNTER — Other Ambulatory Visit: Payer: Self-pay

## 2021-10-19 MED ORDER — BD PEN NEEDLE NANO U/F 32G X 4 MM MISC
3 refills | Status: AC
  Filled 2021-10-19: qty 200, 66d supply, fill #0
  Filled 2022-07-19: qty 300, 75d supply, fill #0

## 2021-10-19 MED ORDER — INSULIN GLARGINE-YFGN 100 UNIT/ML ~~LOC~~ SOPN
30.0000 [IU] | PEN_INJECTOR | Freq: Every day | SUBCUTANEOUS | 0 refills | Status: DC
  Filled 2021-10-19: qty 9, 30d supply, fill #0

## 2021-10-20 ENCOUNTER — Other Ambulatory Visit: Payer: Self-pay

## 2021-11-10 ENCOUNTER — Other Ambulatory Visit: Payer: Self-pay

## 2021-11-10 MED ORDER — SEMGLEE (YFGN) 100 UNIT/ML ~~LOC~~ SOLN
SUBCUTANEOUS | 5 refills | Status: DC
  Filled 2021-11-10: qty 10, 28d supply, fill #0
  Filled 2021-12-13: qty 10, 28d supply, fill #1
  Filled 2022-01-11: qty 10, 28d supply, fill #2
  Filled 2022-02-13: qty 10, 28d supply, fill #3
  Filled 2022-03-20: qty 10, 28d supply, fill #4

## 2021-11-14 ENCOUNTER — Other Ambulatory Visit: Payer: Self-pay

## 2021-11-27 ENCOUNTER — Other Ambulatory Visit: Payer: Self-pay | Admitting: Internal Medicine

## 2021-11-28 MED ORDER — NOVOLOG FLEXPEN 100 UNIT/ML ~~LOC~~ SOPN
PEN_INJECTOR | SUBCUTANEOUS | 1 refills | Status: DC
  Filled 2021-11-28: qty 30, fill #0
  Filled 2021-11-28: qty 15, 30d supply, fill #0
  Filled 2022-01-11: qty 15, 30d supply, fill #1
  Filled 2022-03-20: qty 15, 30d supply, fill #2
  Filled 2022-04-14: qty 15, 30d supply, fill #3

## 2021-11-29 ENCOUNTER — Other Ambulatory Visit: Payer: Self-pay

## 2021-11-30 ENCOUNTER — Other Ambulatory Visit: Payer: Self-pay

## 2021-12-01 ENCOUNTER — Other Ambulatory Visit: Payer: Self-pay

## 2021-12-08 ENCOUNTER — Other Ambulatory Visit: Payer: Self-pay

## 2021-12-08 MED ORDER — ONDANSETRON HCL 4 MG PO TABS
ORAL_TABLET | ORAL | 0 refills | Status: DC
  Filled 2021-12-08: qty 20, 6d supply, fill #0

## 2021-12-11 ENCOUNTER — Other Ambulatory Visit: Payer: Self-pay

## 2021-12-14 ENCOUNTER — Other Ambulatory Visit: Payer: Self-pay

## 2021-12-15 ENCOUNTER — Other Ambulatory Visit: Payer: Self-pay

## 2021-12-20 ENCOUNTER — Ambulatory Visit: Payer: BLUE CROSS/BLUE SHIELD | Admitting: Internal Medicine

## 2022-01-03 ENCOUNTER — Other Ambulatory Visit: Payer: Self-pay

## 2022-01-03 ENCOUNTER — Ambulatory Visit (INDEPENDENT_AMBULATORY_CARE_PROVIDER_SITE_OTHER): Payer: BLUE CROSS/BLUE SHIELD | Admitting: Internal Medicine

## 2022-01-03 ENCOUNTER — Encounter: Payer: Self-pay | Admitting: Internal Medicine

## 2022-01-03 VITALS — BP 120/82 | HR 86 | Ht 61.0 in | Wt 187.0 lb

## 2022-01-03 DIAGNOSIS — Z940 Kidney transplant status: Secondary | ICD-10-CM

## 2022-01-03 DIAGNOSIS — E1042 Type 1 diabetes mellitus with diabetic polyneuropathy: Secondary | ICD-10-CM

## 2022-01-03 DIAGNOSIS — N1832 Chronic kidney disease, stage 3b: Secondary | ICD-10-CM

## 2022-01-03 DIAGNOSIS — E103413 Type 1 diabetes mellitus with severe nonproliferative diabetic retinopathy with macular edema, bilateral: Secondary | ICD-10-CM

## 2022-01-03 DIAGNOSIS — R739 Hyperglycemia, unspecified: Secondary | ICD-10-CM | POA: Diagnosis not present

## 2022-01-03 DIAGNOSIS — E1022 Type 1 diabetes mellitus with diabetic chronic kidney disease: Secondary | ICD-10-CM

## 2022-01-03 LAB — POCT GLYCOSYLATED HEMOGLOBIN (HGB A1C): Hemoglobin A1C: 6.8 % — AB (ref 4.0–5.6)

## 2022-01-03 MED ORDER — OMNIPOD 5 DEXG7G6 INTRO GEN 5 KIT
1.0000 | PACK | 0 refills | Status: DC
  Filled 2022-01-03 – 2022-02-01 (×2): qty 1, 30d supply, fill #0

## 2022-01-03 MED ORDER — OMNIPOD 5 DEXG7G6 PODS GEN 5 MISC
1.0000 | 3 refills | Status: DC
  Filled 2022-01-03: qty 6, 45d supply, fill #0
  Filled 2022-02-01: qty 5, 15d supply, fill #0
  Filled 2022-04-14: qty 5, 15d supply, fill #1
  Filled 2022-05-20: qty 5, 15d supply, fill #2
  Filled 2022-06-03: qty 5, 15d supply, fill #3
  Filled 2022-06-18: qty 5, 15d supply, fill #4

## 2022-01-03 NOTE — Progress Notes (Signed)
?Name: Cathy Green  ?Age/ Sex: 41 y.o., female   ?MRN/ DOB: [id number], [birth year]    ? ?PCP: Ladell Pier, MD   ?Reason for Endocrinology Evaluation: Type 1 Diabetes Mellitus  ?Initial Endocrine Consultative Visit: 11/14/2020  ? ? ?PATIENT IDENTIFIER: Cathy Green is a 41 y.o. female with a past medical history of T1DM , HTN and Dyslipidemia and hx of renal transplant. The patient has followed with Endocrinology clinic since 11/14/2020 for consultative assistance with management of her diabetes. ? ?DIABETIC HISTORY:  ?[name] was diagnosed with DM at age 27, Metformin caused GI side effects .Marland Kitchen Her hemoglobin A1c has ranged from 6.2% in 01/2020, peaking at 8.1% in 08/2020. ? ?S/P renal transplant 06/2020- Through [facility]  ?No hx of dialysis  ? ? ?On her initial visit to our clinic she had an A1c of 6.5 %, we adjusted MDI regimen and provided her with correction scale ?  ?SUBJECTIVE:  ? ?During the last visit (06/21/2021 ): A1c 6.4% adjusted MDi regimen  ? ? ? ? ?Today (01/03/2022): Ms. [name] is here for a follow up on diabetes.  She checks her blood sugars multiple times a day through CGM. The patient has  had hypoglycemic episodes since the last clinic visit, which typically occur during the night.The patient is  symptomatic with these episodes, hand tremors.  ? ?Denies nausea or vomiting  ? ? ?HOME DIABETES REGIMEN:  ?Semglee 30 units daily - take 35 units daily  ?Novolog 8 units with Breakfast, 12 units with lunch and 12 units with Supper  ?CF: Novolog ( BG -130/35)  ? ? ? ? ?Statin: yes ?ACE-I/ARB: no ?Prior Diabetic Education: yes ? ? ? ?CONTINUOUS GLUCOSE MONITORING RECORD INTERPRETATION   ? ?Dates of Recording:3/23-01/03/2022 ? ?Sensor description:dexcom ? ?Results statistics: ?  ?CGM use % of time 100  ?Average and SD 181/59  ?Time in range  55    %  ?% Time Above 180 31  ?% Time above 250 13  ?% Time Below target <1  ? ? ? ? ?Glycemic patterns summary: Bg's optimal overnight and high during  the day  ? ?Hyperglycemic episodes   postprandial  ? ?Hypoglycemic episodes occurred after a bolus  ? ?Overnight periods: trends down ? ? ? ? ? ?DIABETIC COMPLICATIONS: ?Microvascular complications:  ?Neuropathy, CKD (S/P transplant), retinopathy ( S/P injections) ?Last eye exam: Completed 2022 ? ? ?Macrovascular complications:  ? ?Denies: CAD, CVA, PVD ? ? ?HISTORY:  ?Past Medical History:  ?Past Medical History:  ?Diagnosis Date  ? Abnormal Pap smear   ? Allergy   ? seasonal allergies  ? Anemia   ? Chronic kidney disease   ? Diabetes mellitus   ? diagnosed age 62  ? Diabetes mellitus without complication ([place])   ? Glaucoma   ? Hyperlipidemia   ? Hypertension   ? Migraines   ? Noncompliance   ? Numbness in left leg   ? ?Past Surgical History:  ?Past Surgical History:  ?Procedure Laterality Date  ? KIDNEY TRANSPLANT  06/2020  ? WISDOM TOOTH EXTRACTION    ? ?Social History:  reports that she has never smoked. She has never used smokeless tobacco. She reports current alcohol use. She reports that she does not use drugs. ?Family History:  ?Family History  ?Problem Relation Age of Onset  ? Cancer Maternal Grandfather   ?     prostate  ? Hypertension Mother   ? Stroke Mother   ? Cancer Other   ? COPD  Other   ? Hyperlipidemia Other   ? Hypertension Father   ? ? ? ?HOME MEDICATIONS: ?Allergies as of 01/03/2022   ? ?   Reactions  ? Garlic Nausea And Vomiting  ? ?  ? ?  ?Medication List  ?  ? ?  ? Accurate as of January 03, 2022  1:42 PM. If you have any questions, ask your nurse or doctor.  ?  ?  ? ?  ? ?amLODipine 5 MG tablet ?Commonly known as: NORVASC ?Take 1 tablet (5 mg total) by mouth daily. ?  ?atorvastatin 20 MG tablet ?Commonly known as: LIPITOR ?Take 1 tablet (20 mg total) by mouth daily. ?  ?BD Pen Needle Nano U/F 32G X 4 MM Misc ?Generic drug: Insulin Pen Needle ?1 DEVICE BY DOES NOT APPLY ROUTE IN THE MORNING, AT NOON, IN THE EVENING, AND AT BEDTIME. ?  ?Contour Next Test test strip ?Generic drug: glucose blood ?Use  as instructed to check blood sugar TID. ?  ?cyanocobalamin 1000 MCG tablet ?Take by mouth. ?  ?Dexcom G6 Sensor Misc ?1 Device by Does not apply route as directed. ?  ?Dexcom G6 Transmitter Misc ?1 Device by Does not apply route as directed. ?  ?fluticasone 50 MCG/ACT nasal spray ?Commonly known as: FLONASE ?Place 2 sprays into both nostrils daily. ?  ?gabapentin 300 MG capsule ?Commonly known as: NEURONTIN ?Take 1 capsule by mouth 2 (two) times daily. ?  ?mycophenolate 180 MG EC tablet ?Commonly known as: MYFORTIC ?Take by mouth. ?  ?NovoLOG FlexPen 100 UNIT/ML FlexPen ?Generic drug: insulin aspart ?INJECT 5 UNITS INTO THE SKIN 3 TIMES DAILY BEFORE MEALS. ?  ?NovoLOG FlexPen 100 UNIT/ML FlexPen ?Generic drug: insulin aspart ?INJECT 10 UNITS INTO THE SKIN 3 (THREE) TIMES DAILY WITH MEALS. MAX DAILY 50 UNITS ?  ?ondansetron 4 MG tablet ?Commonly known as: ZOFRAN ?Take 1 tablet (4 mg total) by mouth every 8 (eight) hours as needed for Nausea / Vomiting. ?  ?predniSONE 5 MG tablet ?Commonly known as: DELTASONE ?Take 5 mg by mouth daily with breakfast. ?  ?Semglee (yfgn) 100 UNIT/ML Pen ?Generic drug: insulin glargine-yfgn ?Inject 30 Units into the skin daily. ?What changed: how much to take ?  ?Semglee (yfgn) 100 UNIT/ML injection ?Generic drug: insulin glargine-yfgn ?Inject 35 Units into the skin nightly. ?What changed: Another medication with the same name was changed. Make sure you understand how and when to take each. ?  ?sevelamer carbonate 800 MG tablet ?Commonly known as: RENVELA ?Take 800 mg by mouth 2 (two) times daily. ?  ?Simbrinza 1-0.2 % Susp ?Generic drug: Brinzolamide-Brimonidine ?Instill 1 drop into both eyes twice a day ?  ?Simbrinza 1-0.2 % Susp ?Generic drug: Brinzolamide-Brimonidine ?Instill 1 drop into both eyes twice a day ?  ?sulfamethoxazole-trimethoprim 400-80 MG tablet ?Commonly known as: BACTRIM ?Take by mouth. ?  ?tacrolimus 1 MG capsule ?Commonly known as: PROGRAF ?Take 4 capsules in the  morning and 3 capsules at night ?  ?valGANciclovir 450 MG tablet ?Commonly known as: VALCYTE ?Take 900 mg by mouth 2 (two) times daily. ?  ? ?  ? ? ? ?OBJECTIVE:  ? ?Vital Signs: BP 120/82 (BP Location: Left Arm, Patient Position: Sitting, Cuff Size: Large)   Pulse 86   Ht 5\' 1"  (1.549 m)   Wt 187 lb (84.8 kg)   SpO2 99%   BMI 35.33 kg/m?   ?Wt Readings from Last 3 Encounters:  ?01/03/22 187 lb (84.8 kg)  ?06/21/21 173 lb 3.2 oz (78.6  kg)  ?02/15/21 179 lb 2 oz (81.3 kg)  ? ? ? ?Exam: ?General: Pt appears well and is in NAD  ?Neck: General: Supple without adenopathy. ?Thyroid: Thyroid size normal.  No goiter or nodules appreciated.  ?Lungs: Clear with good BS bilat with no rales, rhonchi, or wheezes  ?Heart: RRR with normal S1 and S2 and no gallops; no murmurs; no rub  ?Abdomen: Normoactive bowel sounds, soft, nontender, without masses or organomegaly palpable  ?Extremities: No pretibial edema.   ?Neuro: MS is good with appropriate affect, pt is alert and Ox3  ? ?DM foot exam: 01/03/2022 ?  ?The skin of the feet is intact without sores or ulcerations. ?The pedal pulses are 2+ on right and 2+ on left. ?The sensation is intact to a screening 5.07, 10 gram monofilament bilaterally ?  ?   ? ? ? ?DATA REVIEWED: ? ?Lab Results  ?Component Value Date  ? HGBA1C 6.4 (A) 06/21/2021  ? HGBA1C 7.4 (A) 02/15/2021  ? HGBA1C 6.5 (A) 11/14/2020  ? ?Results for HAYDEE, JABBOUR (MRN [id number]) as of 06/23/2021 12:07 ? Ref. Range 06/21/2021 14:59  ?Total CHOL/HDL Ratio Unknown 5  ?Cholesterol Latest Ref Range: 0 - 200 mg/dL 200  ?HDL Cholesterol Latest Ref Range: >39.00 mg/dL 41.70  ?Direct LDL Latest Units: mg/dL 121.0  ?MICROALB/CREAT RATIO Latest Ref Range: 0.0 - 30.0 mg/g 6.4  ?NonHDL Unknown 158.59  ?Triglycerides Latest Ref Range: 0.0 - 149.0 mg/dL 279.0 (H)  ?VLDL Latest Ref Range: 0.0 - 40.0 mg/dL 55.8 (H)  ?TSH Latest Ref Range: 0.35 - 5.50 uIU/mL 1.03  ?Creatinine,U Latest Units: mg/dL 226.6  ?Microalb, Ur Latest Ref Range:  0.0 - 1.9 mg/dL 14.6 (H)  ? ? ?06/16/2021 ? ?Na 138 ?K 5.2 ?BUN/Cr 25/1.90 ? ? ?ASSESSMENT / PLAN / RECOMMENDATIONS:  ? ?1) Type 1 Diabetes Mellitus, Optimally controlled, With neuropathic, retinopathic compli

## 2022-01-03 NOTE — Patient Instructions (Addendum)
-   Continue  Semglee  35 units daily  ?- Change Novolog 8 units with Breakfast , 10 units with Lunch and 14 units with Supper   ?- Novolog correctional insulin: ADD extra units on insulin to your meal-time Novolog dose if your blood sugars are higher than 160. Use the scale below to help guide you:  ? ?Blood sugar before meal Number of units to inject  ?Less than 170 0 unit  ?171 - 210 1 units  ?211 - 250 2 units  ?251 - 290 3 units  ?291 - 330 4 units  ?331 - 370 5 units  ?371 - 410 6 units  ? ? ? ? ?HOW TO TREAT LOW BLOOD SUGARS (Blood sugar LESS THAN 70 MG/DL) ?Please follow the RULE OF 15 for the treatment of hypoglycemia treatment (when your (blood sugars are less than 70 mg/dL)  ? ?STEP 1: Take 15 grams of carbohydrates when your blood sugar is low, which includes:  ?3-4 GLUCOSE TABS  OR ?3-4 OZ OF JUICE OR REGULAR SODA OR ?ONE TUBE OF GLUCOSE GEL   ? ?STEP 2: RECHECK blood sugar in 15 MINUTES ?STEP 3: If your blood sugar is still low at the 15 minute recheck --> then, go back to STEP 1 and treat AGAIN with another 15 grams of carbohydrates. ? ?

## 2022-01-04 ENCOUNTER — Encounter: Payer: Self-pay | Admitting: Internal Medicine

## 2022-01-12 ENCOUNTER — Other Ambulatory Visit: Payer: Self-pay

## 2022-02-01 ENCOUNTER — Other Ambulatory Visit: Payer: Self-pay

## 2022-02-02 ENCOUNTER — Other Ambulatory Visit: Payer: Self-pay

## 2022-02-06 ENCOUNTER — Other Ambulatory Visit: Payer: Self-pay

## 2022-02-07 ENCOUNTER — Other Ambulatory Visit: Payer: Self-pay

## 2022-02-08 ENCOUNTER — Other Ambulatory Visit: Payer: Self-pay

## 2022-02-14 ENCOUNTER — Other Ambulatory Visit: Payer: Self-pay

## 2022-03-20 ENCOUNTER — Other Ambulatory Visit: Payer: Self-pay | Admitting: Internal Medicine

## 2022-03-20 DIAGNOSIS — E1042 Type 1 diabetes mellitus with diabetic polyneuropathy: Secondary | ICD-10-CM

## 2022-03-21 ENCOUNTER — Other Ambulatory Visit: Payer: Self-pay

## 2022-03-21 MED ORDER — GABAPENTIN 300 MG PO CAPS
ORAL_CAPSULE | Freq: Two times a day (BID) | ORAL | 0 refills | Status: DC
  Filled 2022-03-21: qty 180, 90d supply, fill #0

## 2022-03-22 ENCOUNTER — Other Ambulatory Visit: Payer: Self-pay

## 2022-04-02 ENCOUNTER — Encounter: Payer: MEDICAID | Attending: Internal Medicine | Admitting: Nutrition

## 2022-04-02 ENCOUNTER — Telehealth: Payer: Self-pay | Admitting: Nutrition

## 2022-04-02 ENCOUNTER — Other Ambulatory Visit: Payer: Self-pay

## 2022-04-02 DIAGNOSIS — E1042 Type 1 diabetes mellitus with diabetic polyneuropathy: Secondary | ICD-10-CM | POA: Insufficient documentation

## 2022-04-02 MED ORDER — INSULIN ASPART 100 UNIT/ML IJ SOLN
INTRAMUSCULAR | 11 refills | Status: DC
  Filled 2022-04-02: qty 10, 10d supply, fill #0

## 2022-04-02 NOTE — Telephone Encounter (Signed)
I have started her on her insulin pump.  She will need a prescription sent to [facility] for Novolog in a vial. 50u/day.  Thank you

## 2022-04-02 NOTE — Progress Notes (Signed)
We discussed how this pump delivers insulin and settings were put into the PDM by patient, per DR. Shamleffer's orders:   Basal rate: MN: 1.10, ISF: 40,  I/C: 12, duration: 4 hours, target: 130, with correction over 130 She filled a pod with Novolog insulin via her pens.  She was also shown how to draw up insulin in a syringe and fill her pod with the syringe.  She will use up her pens first, before going to the pharmacy and picking up her insulin.  Note sent to DR. Shamleffer's CMA to order Novolog insulin in vials. She is currently wearing a dexcom, and this was linked to her PDM.   She took her Semglee insulin last night, so the pump was put into the manuel mode and at a 100 % basal reduction mode until 7:30 tonight.  She was shown and redemonstrated how to put the pump into the automated mode when she gets the alarm tonight that her temp basal rate has ended.  She did this X2 and had no questions. She was shown how to bolus.  She is currently using units of insulin, but says she knows how to count carbs.  We reviewed this quickly, and she was told to download the calorie [facility] app, and was given a fast food guide for carbs.   She redemonstrated correctly how to bolus and how to enter CGM, and discussed how this pump calculates the bolus amount.  She had no questions and how to do this. We reviewed high blood sugar protocol, sick day guidelines, alerts and alarms on the pump.  She reported good understanding on all topics and signed the pump training checklist.  She was told to call the office if blood sugars drop low, or remain over 200.  She agreed to do this.  She was told to make a return appointment in 30 days to see Dr. Kelton Pillar.

## 2022-04-02 NOTE — Patient Instructions (Signed)
Read over starter booklet and pump manuel Call office if blood sugars remain over 200, or drop below 70 Call OmniPod help line, if questions on how to use the pump

## 2022-04-03 ENCOUNTER — Telehealth: Payer: Self-pay | Admitting: Nutrition

## 2022-04-03 NOTE — Telephone Encounter (Signed)
Patient reported no difficulty in putting the pump in the automated mode after the 100% basal reduction mode ended at 7:30 PM last night.  She also reported no difficulty giving a bolus last night before supper, or this morning before breakfast.  Reports FBS today was 130.  She had no questions for me at this time, and reported that she is liking this so far.   She was reminded that she needs an appointment with Dr. Kelton Pillar in 4 weeks.

## 2022-04-04 ENCOUNTER — Other Ambulatory Visit: Payer: Self-pay

## 2022-04-04 MED ORDER — INSULIN ASPART 100 UNIT/ML IJ SOLN
INTRAMUSCULAR | 11 refills | Status: DC
  Filled 2022-04-04: qty 10, 20d supply, fill #0
  Filled 2022-04-30: qty 10, 20d supply, fill #1
  Filled 2022-05-20: qty 10, 20d supply, fill #2
  Filled 2022-06-03: qty 10, 20d supply, fill #3
  Filled 2022-06-18: qty 20, 40d supply, fill #4
  Filled 2022-06-21 – 2022-06-25 (×3): qty 10, 20d supply, fill #4
  Filled 2022-07-19: qty 10, 20d supply, fill #5
  Filled 2022-08-19: qty 10, 20d supply, fill #6
  Filled 2022-09-06: qty 10, 20d supply, fill #7
  Filled 2022-09-25: qty 10, 20d supply, fill #8
  Filled 2022-10-15: qty 10, 20d supply, fill #9
  Filled 2022-11-04: qty 10, 20d supply, fill #10
  Filled 2022-12-02: qty 10, 20d supply, fill #11

## 2022-04-04 NOTE — Telephone Encounter (Signed)
Script was sent

## 2022-04-05 ENCOUNTER — Other Ambulatory Visit: Payer: Self-pay

## 2022-04-14 ENCOUNTER — Other Ambulatory Visit: Payer: Self-pay | Admitting: Family Medicine

## 2022-04-14 DIAGNOSIS — E1042 Type 1 diabetes mellitus with diabetic polyneuropathy: Secondary | ICD-10-CM

## 2022-04-16 ENCOUNTER — Other Ambulatory Visit: Payer: Self-pay

## 2022-04-17 ENCOUNTER — Other Ambulatory Visit: Payer: Self-pay

## 2022-04-17 NOTE — Telephone Encounter (Signed)
Refilled 03/21/2022 #[id number] 0 refills. Requested Prescriptions  Pending Prescriptions Disp Refills  . gabapentin (NEURONTIN) 300 MG capsule 180 capsule 0    Sig: Take 1 capsule by mouth 2 (two) times daily.     Neurology: Anticonvulsants - gabapentin Failed - 04/14/2022 12:30 PM      Failed - Cr in normal range and within 360 days    Creat  Date Value Ref Range Status  04/17/2016 2.57 (H) 0.50 - 1.10 mg/dL Final   Creatinine, Ser  Date Value Ref Range Status  08/15/2019 4.65 (H) 0.44 - 1.00 mg/dL Final   Creatinine,U  Date Value Ref Range Status  06/21/2021 226.6 mg/dL Final   Creatinine, Urine  Date Value Ref Range Status  11/04/2018 84.10 mg/dL Final    Comment:    Performed at [facility], [place] [address] [place], [place] [address]         Failed - Completed PHQ-2 or PHQ-9 in the last 360 days      Failed - Valid encounter within last 12 months    Recent Outpatient Visits          1 year ago Hypertensive kidney disease   [place] Karle Plumber B, MD   1 year ago 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

## 2022-04-18 ENCOUNTER — Other Ambulatory Visit: Payer: Self-pay

## 2022-04-19 ENCOUNTER — Other Ambulatory Visit: Payer: Self-pay

## 2022-05-01 ENCOUNTER — Other Ambulatory Visit: Payer: Self-pay

## 2022-05-02 ENCOUNTER — Other Ambulatory Visit: Payer: Self-pay

## 2022-05-20 ENCOUNTER — Other Ambulatory Visit: Payer: Self-pay | Admitting: Family Medicine

## 2022-05-20 DIAGNOSIS — E1042 Type 1 diabetes mellitus with diabetic polyneuropathy: Secondary | ICD-10-CM

## 2022-05-21 ENCOUNTER — Other Ambulatory Visit: Payer: Self-pay

## 2022-05-22 ENCOUNTER — Other Ambulatory Visit: Payer: Self-pay

## 2022-05-22 MED ORDER — GABAPENTIN 300 MG PO CAPS
ORAL_CAPSULE | Freq: Two times a day (BID) | ORAL | 0 refills | Status: DC
  Filled 2022-05-22: qty 180, fill #0
  Filled 2022-06-03 – 2022-06-18 (×2): qty 180, 90d supply, fill #0

## 2022-05-22 NOTE — Telephone Encounter (Signed)
Courtesy refill.  appt scheduled for 08/02/22.  Requested Prescriptions  Pending Prescriptions Disp Refills  . gabapentin (NEURONTIN) 300 MG capsule 180 capsule 0    Sig: Take 1 capsule by mouth 2 (two) times daily.     Neurology: Anticonvulsants - gabapentin Failed - 05/20/2022  9:02 PM      Failed - Cr in normal range and within 360 days    Creat  Date Value Ref Range Status  04/17/2016 2.57 (H) 0.50 - 1.10 mg/dL Final   Creatinine, Ser  Date Value Ref Range Status  08/15/2019 4.65 (H) 0.44 - 1.00 mg/dL Final   Creatinine,U  Date Value Ref Range Status  06/21/2021 226.6 mg/dL Final   Creatinine, Urine  Date Value Ref Range Status  11/04/2018 84.10 mg/dL Final    Comment:    Performed at [facility], [place] [address], [place], [place] [address]         Failed - Completed PHQ-2 or PHQ-9 in the last 360 days      Failed - Valid encounter within last 12 months    Recent Outpatient Visits          1 year ago Hypertensive kidney disease   [place] Karle Plumber B, MD   1 year ago 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

## 2022-06-05 ENCOUNTER — Other Ambulatory Visit: Payer: Self-pay

## 2022-06-19 ENCOUNTER — Other Ambulatory Visit: Payer: Self-pay | Admitting: Internal Medicine

## 2022-06-19 ENCOUNTER — Other Ambulatory Visit: Payer: Self-pay

## 2022-06-19 MED ORDER — OMNIPOD 5 DEXG7G6 PODS GEN 5 MISC
1.0000 | 3 refills | Status: DC
  Filled 2022-06-19: qty 10, 30d supply, fill #0
  Filled 2022-07-19: qty 10, 30d supply, fill #1
  Filled 2022-08-19: qty 10, 30d supply, fill #2
  Filled 2022-09-25: qty 10, 30d supply, fill #3
  Filled 2022-11-04: qty 10, 30d supply, fill #4
  Filled 2022-12-02: qty 10, 30d supply, fill #5

## 2022-06-21 ENCOUNTER — Other Ambulatory Visit: Payer: Self-pay

## 2022-06-25 ENCOUNTER — Other Ambulatory Visit: Payer: Self-pay

## 2022-07-09 ENCOUNTER — Ambulatory Visit (INDEPENDENT_AMBULATORY_CARE_PROVIDER_SITE_OTHER): Payer: Medicaid Other | Admitting: Internal Medicine

## 2022-07-09 ENCOUNTER — Encounter: Payer: Self-pay | Admitting: Internal Medicine

## 2022-07-09 VITALS — BP 144/90 | HR 85 | Ht 61.0 in | Wt 193.4 lb

## 2022-07-09 DIAGNOSIS — E1042 Type 1 diabetes mellitus with diabetic polyneuropathy: Secondary | ICD-10-CM | POA: Diagnosis not present

## 2022-07-09 DIAGNOSIS — E1022 Type 1 diabetes mellitus with diabetic chronic kidney disease: Secondary | ICD-10-CM | POA: Diagnosis not present

## 2022-07-09 DIAGNOSIS — E1065 Type 1 diabetes mellitus with hyperglycemia: Secondary | ICD-10-CM

## 2022-07-09 DIAGNOSIS — Z940 Kidney transplant status: Secondary | ICD-10-CM | POA: Diagnosis not present

## 2022-07-09 DIAGNOSIS — E103413 Type 1 diabetes mellitus with severe nonproliferative diabetic retinopathy with macular edema, bilateral: Secondary | ICD-10-CM

## 2022-07-09 DIAGNOSIS — N1832 Chronic kidney disease, stage 3b: Secondary | ICD-10-CM

## 2022-07-09 LAB — POCT GLYCOSYLATED HEMOGLOBIN (HGB A1C): Hemoglobin A1C: 8.1 % — AB (ref 4.0–5.6)

## 2022-07-09 NOTE — Progress Notes (Signed)
Name: Cathy Green  Age/ Sex: 41 y.o., female   MRN/ DOB: [id number], [birth year]     PCP: Ladell Pier, MD   Reason for Endocrinology Evaluation: Type 1 Diabetes Mellitus  Initial Endocrine Consultative Visit: 11/14/2020    PATIENT IDENTIFIER: [name] is a 41 y.o. female with a past medical history of T1DM , HTN and Dyslipidemia and hx of renal transplant. The patient has followed with Endocrinology clinic since 11/14/2020 for consultative assistance with management of her diabetes.  DIABETIC HISTORY:  [name] was diagnosed with DM at age 46, Metformin caused GI side effects .Marland Kitchen Her hemoglobin A1c has ranged from 6.2% in 01/2020, peaking at 8.1% in 08/2020.  S/P renal transplant 06/2020- Through [facility]  No hx of dialysis    On her initial visit to our clinic she had an A1c of 6.5 %, we adjusted MDI regimen and provided her with correction scale  She was started on OmniPod in July 2023   SUBJECTIVE:   During the last visit (06/21/2021 ): A1c 6.8% adjusted MDi regimen      Today (07/09/2022): Cathy Green is here for a follow up on diabetes.  She checks her blood sugars multiple times a day through CGM. The patient has  had hypoglycemic episodes since the last clinic visit, which typically occur during the night.The patient is  symptomatic with these episodes, hand tremors.    She continues to follow-up with kidney transplant team, last visit 06/22/2022 Denies nausea or vomiting or diarrhea    This patient with type 1 diabetes is treated with Omnipod (insulin pump). During the visit the pump basal and bolus doses were reviewed including carb/insulin rations and supplemental doses. The clinical list was updated. The glucose meter download was reviewed in detail to determine if the current pump settings are providing the best glycemic control without excessive hypoglycemia.  Pump and meter download:    Pump   Omnipod Settings   Insulin type   Novolog   Basal  rate       0000 1.10 u/h               I:C ratio       0000 1:12                   Sensitivity       0000  40      Goal       0000  130             Type & Model of Pump: Omnipod Insulin Type: Currently using Novolog .  PUMP STATISTICS: Average BG: 208 Average Daily Carbs (g): 214.6  Average Total Daily Insulin: 54.6  Average Daily Basal: 34.5 (63 %) Average Daily Bolus: 20 (37 %)      HOME DIABETES REGIMEN:  Novolog      Statin: yes ACE-I/ARB: no Prior Diabetic Education: yes    CONTINUOUS GLUCOSE MONITORING RECORD INTERPRETATION    Dates of Recording:9/26-10/06/2022  Sensor description:dexcom  Results statistics:   CGM use % of time 94  Average and SD 208/80  Time in range 47  %  % Time Above 180 24  % Time above 250 >250  % Time Below target 0      Glycemic patterns summary: Bg's optimal overnight and high during the day   Hyperglycemic episodes   postprandial   Hypoglycemic episodes occurred n/a   Overnight periods: trends down      DIABETIC COMPLICATIONS: Microvascular  complications:  Neuropathy, CKD (S/P transplant), retinopathy ( S/P injections) Last eye exam: Completed 05/22/2022   Macrovascular complications:   Denies: CAD, CVA, PVD   HISTORY:  Past Medical History:  Past Medical History:  Diagnosis Date   Abnormal Pap smear    Allergy    seasonal allergies   Anemia    Chronic kidney disease    Diabetes mellitus    diagnosed age 62   Diabetes mellitus without complication ([place])    Glaucoma    Hyperlipidemia    Hypertension    Migraines    Noncompliance    Numbness in left leg    Past Surgical History:  Past Surgical History:  Procedure Laterality Date   KIDNEY TRANSPLANT  06/2020   WISDOM TOOTH EXTRACTION     Social History:  reports that she has never smoked. She has never used smokeless tobacco. She reports current alcohol use. She reports that she does not use drugs. Family History:  Family  History  Problem Relation Age of Onset   Cancer Maternal Grandfather        prostate   Hypertension Mother    Stroke Mother    Cancer Other    COPD Other    Hyperlipidemia Other    Hypertension Father      HOME MEDICATIONS: Allergies as of 07/09/2022       Reactions   Garlic Nausea And Vomiting        Medication List        Accurate as of July 09, 2022  1:54 PM. If you have any questions, ask your nurse or doctor.          amLODipine 5 MG tablet Commonly known as: NORVASC Take 1 tablet (5 mg total) by mouth daily.   atorvastatin 20 MG tablet Commonly known as: LIPITOR Take 1 tablet (20 mg total) by mouth daily.   BD Pen Needle Nano U/F 32G X 4 MM Misc Generic drug: Insulin Pen Needle 1 DEVICE BY DOES NOT APPLY ROUTE IN THE MORNING, AT NOON, IN THE EVENING, AND AT BEDTIME.   Contour Next Test test strip Generic drug: glucose blood Use as instructed to check blood sugar TID.   cyanocobalamin 1000 MCG tablet Take by mouth.   Dexcom G6 Sensor Misc 1 Device by Does not apply route as directed.   Dexcom G6 Transmitter Misc 1 Device by Does not apply route as directed.   fluticasone 50 MCG/ACT nasal spray Commonly known as: FLONASE Place 2 sprays into both nostrils daily.   gabapentin 300 MG capsule Commonly known as: NEURONTIN Take 1 capsule by mouth 2 (two) times daily.   mycophenolate 180 MG EC tablet Commonly known as: MYFORTIC Take by mouth.   NovoLOG FlexPen 100 UNIT/ML FlexPen Generic drug: insulin aspart INJECT 5 UNITS INTO THE SKIN 3 TIMES DAILY BEFORE MEALS. What changed: Another medication with the same name was changed. Make sure you understand how and when to take each.   NovoLOG FlexPen 100 UNIT/ML FlexPen Generic drug: insulin aspart INJECT 10 UNITS INTO THE SKIN 3 (THREE) TIMES DAILY WITH MEALS. MAX DAILY 50 UNITS What changed: additional instructions   NovoLOG 100 UNIT/ML injection Generic drug: insulin aspart 50 units max  daily dose. Use with pump What changed: Another medication with the same name was changed. Make sure you understand how and when to take each.   Omnipod 5 G6 Intro (Gen 5) Kit use as directed : change pod every 3 days.   Omnipod 5  G6 Pod (Gen 5) Misc change pod every 3 days   ondansetron 4 MG tablet Commonly known as: ZOFRAN Take 1 tablet (4 mg total) by mouth every 8 (eight) hours as needed for Nausea / Vomiting.   predniSONE 5 MG tablet Commonly known as: DELTASONE Take 5 mg by mouth daily with breakfast.   Semglee (yfgn) 100 UNIT/ML injection Generic drug: insulin glargine-yfgn Inject 35 Units into the skin nightly. What changed: Another medication with the same name was removed. Continue taking this medication, and follow the directions you see here. Changed by: Dorita Sciara, MD   sevelamer carbonate 800 MG tablet Commonly known as: RENVELA Take 800 mg by mouth 2 (two) times daily.   Simbrinza 1-0.2 % Susp Generic drug: Brinzolamide-Brimonidine Instill 1 drop into both eyes twice a day   Simbrinza 1-0.2 % Susp Generic drug: Brinzolamide-Brimonidine Instill 1 drop into both eyes twice a day   sulfamethoxazole-trimethoprim 400-80 MG tablet Commonly known as: BACTRIM Take by mouth.   tacrolimus 1 MG capsule Commonly known as: PROGRAF Take 4 capsules in the morning and 3 capsules at night   valGANciclovir 450 MG tablet Commonly known as: VALCYTE Take 900 mg by mouth 2 (two) times daily.         OBJECTIVE:   Vital Signs: BP (!) 144/90 (BP Location: Left Arm, Patient Position: Sitting, Cuff Size: Large)   Pulse 85   Ht $R'5\' 1"'AB$  (1.549 m)   Wt 193 lb 6.4 oz (87.7 kg)   SpO2 97%   BMI 36.54 kg/m   Wt Readings from Last 3 Encounters:  07/09/22 193 lb 6.4 oz (87.7 kg)  01/03/22 187 lb (84.8 kg)  06/21/21 173 lb 3.2 oz (78.6 kg)     Exam: General: Pt appears well and is in NAD  Neck: General: Supple without adenopathy. Thyroid: Thyroid size normal.   No goiter or nodules appreciated.  Lungs: Clear with good BS bilat with no rales, rhonchi, or wheezes  Heart: RRR with normal S1 and S2 and no gallops; no murmurs; no rub  Abdomen: Normoactive bowel sounds, soft, nontender, without masses or organomegaly palpable  Extremities: No pretibial edema.   Neuro: MS is good with appropriate affect, pt is alert and Ox3   DM foot exam: 01/03/2022   The skin of the feet is intact without sores or ulcerations. The pedal pulses are 2+ on right and 2+ on left. The sensation is intact to a screening 5.07, 10 gram monofilament bilaterally         DATA REVIEWED:  Lab Results  Component Value Date   HGBA1C 8.1 (A) 07/09/2022   HGBA1C 6.8 (A) 01/03/2022   HGBA1C 6.4 (A) 06/21/2021   06/22/2022 BUN 31 Cr. 1.56  GFR 43  ASSESSMENT / PLAN / RECOMMENDATIONS:   1) Type 1 Diabetes Mellitus, Poorly controlled, With neuropathic, retinopathic , CKD III complications and S/P renal transplant   - Most recent A1c of 8.1  %. Goal A1c < 7.0 %.    - A1c has increased since being on the pump  - She attributes this to not feeling well  - In downloading her CGM and pump she has been noted with hyperglycemia postprandial especially after supper. I will change I:C ratio for supper and change glucose target and sensitivity factor as below    MEDICATIONS:    Pump   Omnipod Settings   Insulin type   Novolog   Basal rate       0000 1.10 u/h  I:C ratio       0000 1:12    1700 1:10              Sensitivity       0000  35      Goal       0000  120           EDUCATION / INSTRUCTIONS: BG monitoring instructions: Patient is instructed to check her blood sugars 3 times a day, before meals  Call [facility] if: BG persistently < 70  I reviewed the Rule of 15 for the treatment of hypoglycemia in detail with the patient. Literature supplied.    2) Diabetic complications:  Eye: Does  have known diabetic retinopathy.   Neuro/ Feet: Does have known diabetic peripheral neuropathy .  Renal: Patient does  have known baseline CKD, she is status post renal transplant. She   is not on an ACEI/ARB at present.    3) Dyslipidemia:   -LDL remains above goal but it is trending down from prior.  TG levels have been fluctuating -No changes at this time , will repeat lipid panel on next visit   Medication Continue atorvastatin 20 mg daily   F/U in 6 months   Signed electronically by: Mack Guise, MD  [facility]  [facility] [address] [place], [place] [address] Phone: [phone] FAX: [phone]   CC: Ladell Pier, MD [place] [address] Phone: [phone]  Fax: [phone]  Return to Endocrinology clinic as below: Future Appointments  Date Time Provider [place]  08/02/2022  3:10 PM Ladell Pier, MD [facility] None

## 2022-07-09 NOTE — Patient Instructions (Signed)
   Pump   Omnipod Settings   Insulin type   Novolog   Basal rate       0000 1.10 u/h               I:C ratio       0000 1:12    1700 1:10              Sensitivity       0000  35      Goal       0000  120

## 2022-07-19 ENCOUNTER — Other Ambulatory Visit: Payer: Self-pay | Admitting: Internal Medicine

## 2022-07-19 ENCOUNTER — Other Ambulatory Visit: Payer: Self-pay

## 2022-07-19 DIAGNOSIS — E1069 Type 1 diabetes mellitus with other specified complication: Secondary | ICD-10-CM

## 2022-07-19 MED ORDER — ATORVASTATIN CALCIUM 20 MG PO TABS
20.0000 mg | ORAL_TABLET | Freq: Every day | ORAL | 3 refills | Status: DC
  Filled 2022-07-19: qty 90, 90d supply, fill #0
  Filled 2022-10-15: qty 30, 30d supply, fill #1
  Filled 2022-12-02: qty 30, 30d supply, fill #2
  Filled 2023-01-10 – 2023-01-17 (×2): qty 30, 30d supply, fill #3
  Filled 2023-02-06 – 2023-02-20 (×2): qty 30, 30d supply, fill #4
  Filled 2023-03-12: qty 30, 30d supply, fill #5
  Filled ????-??-??: fill #5

## 2022-07-19 MED ORDER — NOVOLOG FLEXPEN 100 UNIT/ML ~~LOC~~ SOPN
PEN_INJECTOR | SUBCUTANEOUS | 1 refills | Status: DC
  Filled 2022-07-19: qty 15, 30d supply, fill #0
  Filled 2022-10-15: qty 15, 30d supply, fill #1

## 2022-07-19 NOTE — Telephone Encounter (Signed)
Requested medication (s) are due for refill today: yes  Requested medication (s) are on the active medication list:yes  Last refill:  07/18/20 Future visit scheduled: yes  Notes to clinic:  Unable to refill per protocol, appointment needed.  Patient has future OV scheduled in November. Last OV over a year. Routing for approval.     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

## 2022-07-20 ENCOUNTER — Other Ambulatory Visit: Payer: Self-pay

## 2022-07-23 ENCOUNTER — Other Ambulatory Visit: Payer: Self-pay

## 2022-07-24 ENCOUNTER — Other Ambulatory Visit: Payer: Self-pay

## 2022-07-27 ENCOUNTER — Other Ambulatory Visit: Payer: Self-pay

## 2022-08-02 ENCOUNTER — Other Ambulatory Visit (HOSPITAL_COMMUNITY): Admit: 2022-08-02 | Payer: BLUE CROSS/BLUE SHIELD

## 2022-08-02 ENCOUNTER — Other Ambulatory Visit (HOSPITAL_COMMUNITY)
Admission: RE | Admit: 2022-08-02 | Discharge: 2022-08-02 | Disposition: A | Discharge status: Home / Self Care | Payer: BLUE CROSS/BLUE SHIELD | Source: Ambulatory Visit | Attending: Internal Medicine | Admitting: Internal Medicine

## 2022-08-02 ENCOUNTER — Other Ambulatory Visit (HOSPITAL_COMMUNITY): Admission: RE | Admit: 2022-08-02 | Payer: BLUE CROSS/BLUE SHIELD | Source: Ambulatory Visit

## 2022-08-02 ENCOUNTER — Ambulatory Visit: Payer: BLUE CROSS/BLUE SHIELD | Attending: Internal Medicine | Admitting: Internal Medicine

## 2022-08-02 ENCOUNTER — Encounter: Payer: Self-pay | Admitting: Internal Medicine

## 2022-08-02 VITALS — BP 156/95 | HR 96 | Temp 98.0°F | Ht 61.0 in | Wt 195.2 lb

## 2022-08-02 DIAGNOSIS — G4489 Other headache syndrome: Secondary | ICD-10-CM | POA: Diagnosis not present

## 2022-08-02 DIAGNOSIS — E669 Obesity, unspecified: Secondary | ICD-10-CM | POA: Diagnosis not present

## 2022-08-02 DIAGNOSIS — I129 Hypertensive chronic kidney disease with stage 1 through stage 4 chronic kidney disease, or unspecified chronic kidney disease: Secondary | ICD-10-CM

## 2022-08-02 DIAGNOSIS — Z1159 Encounter for screening for other viral diseases: Secondary | ICD-10-CM

## 2022-08-02 DIAGNOSIS — Z124 Encounter for screening for malignant neoplasm of cervix: Secondary | ICD-10-CM | POA: Insufficient documentation

## 2022-08-02 DIAGNOSIS — E1069 Type 1 diabetes mellitus with other specified complication: Secondary | ICD-10-CM | POA: Diagnosis not present

## 2022-08-02 DIAGNOSIS — Z0001 Encounter for general adult medical examination with abnormal findings: Secondary | ICD-10-CM

## 2022-08-02 DIAGNOSIS — Z23 Encounter for immunization: Secondary | ICD-10-CM

## 2022-08-02 DIAGNOSIS — Z6836 Body mass index (BMI) 36.0-36.9, adult: Secondary | ICD-10-CM

## 2022-08-02 DIAGNOSIS — Z1231 Encounter for screening mammogram for malignant neoplasm of breast: Secondary | ICD-10-CM

## 2022-08-02 DIAGNOSIS — Z0000 Encounter for general adult medical examination without abnormal findings: Secondary | ICD-10-CM

## 2022-08-02 NOTE — Progress Notes (Signed)
Patient ID: Cathy Green, female    DOB: 03/04/1981  MRN: [id number]  CC: Annual Exam and Gynecologic Exam   Subjective: Cathy Green is a 41 y.o. female who presents for annual exam Her concerns today include:  Hx Dm type 1 (dx at age 68) with nephropathy, retinopathy and neuropathy. 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   GYN History:  Pt is G0P0 Any hx of abn paps?: 2019 by her gyn Menses regular or irregular?:  regular How long does menses last?  5 days Menstrual flow light or heavy?: heavy to moderate Method of birth control?:  no Any vaginal dischg at this time?: no Dysuria?: no Any hx of STI?: no Sexually active with how many partners:  one female partner Desires STI screen:  yes Last MMG: none.  Would like to start screening. Family hx of uterine, cervical or breast cancer?:  cousin with breast CA  HTN: checking BID This a.m was 117/79.  Ususally runs in the 120s/70s in a.m and higher at nights in 120s/85-90 No dizziness HA a few times a wk for yrs.  Dx with migraines as teenager.  Current HA  different in that not as intense and not as long lasting as migraines she use to have.  No N/V/photophobia, no initiating factors. Takes Tylenol which does not help  DM: Followed by endocrinology home she saw her last month.  She is due for urine microalbumin.  Reports that she does not do well with her eating habits and when she eats she tends to overeat.  HM:  due for flu and COVID booster and hep c screen. 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 ([place]) 11/14/2020   Type 1 diabetes mellitus with stage 3b  chronic kidney disease ([place]) 11/14/2020   Hyperlipidemia due to type 1 diabetes mellitus ([place]) 08/02/2020   Hypertensive kidney disease 08/02/2020   Kidney transplant recipient 08/02/2020   Hypoglycemia due to insulin 02/12/2020   CKD stage 5 due to type 1 diabetes mellitus ([place]) 09/13/2019   Migraine without aura and without status migrainosus, not intractable 06/22/2019   Chest pain 11/04/2018   Hyperkalemia 11/04/2018   Diabetic retinopathy ([place]) 04/26/2017   Proteinuria 11/01/2015   Normocytic anemia 09/07/2011   Hyperlipidemia 09/07/2011   Hypertension 09/07/2011     Current Outpatient Medications on File Prior to Visit  Medication Sig Dispense Refill   amLODipine (NORVASC) 5 MG tablet Take 1 tablet (5 mg total) by mouth daily. 30 tablet 6   atorvastatin (LIPITOR) 20 MG tablet Take 1 tablet (20 mg total) by mouth daily. 90 tablet 3   Continuous Blood Gluc Sensor (DEXCOM G6 SENSOR) MISC 1 Device by Does not apply route as directed. 9 each 3   Continuous Blood Gluc Transmit (DEXCOM G6 TRANSMITTER) MISC 1 Device by Does not apply route as directed. 1 each 3   cyanocobalamin 1000 MCG tablet Take by mouth.     fluticasone (FLONASE) 50 MCG/ACT nasal spray Place 2 sprays into both nostrils daily. 16 g 6   gabapentin (NEURONTIN) 300 MG capsule Take 1 capsule by mouth 2 (two) times daily. 180 capsule 0   glucose  blood (CONTOUR NEXT TEST) test strip Use as instructed to check blood sugar TID. 100 each 6   insulin aspart (NOVOLOG FLEXPEN) 100 UNIT/ML FlexPen INJECT 10 UNITS INTO THE SKIN 3 (THREE) TIMES DAILY WITH MEALS. MAX DAILY 50 UNITS 30 mL 1   insulin aspart (NOVOLOG) 100 UNIT/ML injection 50 units max daily dose. Use with pump 10 mL 11   Insulin Disposable Pump (OMNIPOD 5 G6 INTRO, GEN 5,) KIT use as directed : change pod every 3 days. 1 kit 0   Insulin Disposable Pump (OMNIPOD 5 G6 POD, GEN 5,) MISC change pod every 3 days 15 each 3   Insulin Pen Needle (BD PEN NEEDLE NANO U/F) 32G X  4 MM MISC 1 DEVICE BY DOES NOT APPLY ROUTE IN THE MORNING, AT NOON, IN THE EVENING, AND AT BEDTIME. 400 each 3   mycophenolate (MYFORTIC) 180 MG EC tablet Take by mouth.     ondansetron (ZOFRAN) 4 MG tablet Take 1 tablet (4 mg total) by mouth every 8 (eight) hours as needed for Nausea / Vomiting. 20 tablet 0   predniSONE (DELTASONE) 5 MG tablet Take 5 mg by mouth daily with breakfast.     sulfamethoxazole-trimethoprim (BACTRIM) 400-80 MG tablet Take by mouth.     tacrolimus (PROGRAF) 1 MG capsule Take 4 capsules in the morning and 3 capsules at night     valGANciclovir (VALCYTE) 450 MG tablet Take 900 mg by mouth 2 (two) times daily.     insulin aspart (NOVOLOG) 100 UNIT/ML FlexPen INJECT 5 UNITS INTO THE SKIN 3 TIMES DAILY BEFORE MEALS. 15 mL 2   No current facility-administered medications on file prior to visit.    Allergies  Allergen Reactions   Garlic Nausea And Vomiting    Social History   Socioeconomic History   Marital status: Significant Other    Spouse name: Not on file   Number of children: Not on file   Years of education: Not on file   Highest education level: Not on file  Occupational History   Not on file  Tobacco Use   Smoking status: Never   Smokeless tobacco: Never  Vaping Use   Vaping Use: Never used  Substance and Sexual Activity   Alcohol use: Yes    Comment: occasional    Drug use: No   Sexual activity: Never    Birth control/protection: Pill  Other Topics Concern   Not on file  Social History Narrative   ** Merged History Encounter **       Social Determinants of Health   Financial Resource Strain: Not on file  Food Insecurity: Not on file  Transportation Needs: Not on file  Physical Activity: Not on file  Stress: Not on file  Social Connections: Not on file  Intimate Partner Violence: Not on file    Family History  Problem Relation Age of Onset   Cancer Maternal Grandfather        prostate   Hypertension Mother    Stroke Mother     Cancer Other    COPD Other    Hyperlipidemia Other    Hypertension Father     Past Surgical History:  Procedure Laterality Date   KIDNEY TRANSPLANT  06/2020   WISDOM TOOTH EXTRACTION      ROS: Review of Systems Negative except as stated above  PHYSICAL EXAM: BP (!) 156/95   Pulse 96   Temp 98 F (36.7 C) (Temporal)   Ht _0  (1.549 m)   Wt  195 lb 3.2 oz (88.5 kg)   LMP 07/16/2022 (Approximate)   SpO2 97%   BMI 36.88 kg/m   Wt Readings from Last 3 Encounters:  08/02/22 195 lb 3.2 oz (88.5 kg)  07/09/22 193 lb 6.4 oz (87.7 kg)  01/03/22 187 lb (84.8 kg)    Physical Exam Repeat blood pressure 160/90. General appearance - alert, well appearing, obese middle-age African-American female and in no distress Mental status - normal mood, behavior, speech, dress, motor activity, and thought processes Eyes - pupils equal and reactive, extraocular eye movements intact Nose - normal and patent, no erythema, discharge or polyps Mouth - mucous membranes moist, pharynx normal without lesions Neck - supple, no significant adenopathy Lymphatics - no palpable lymphadenopathy, no hepatosplenomegaly Chest - clear to auscultation, no wheezes, rales or rhonchi, symmetric air entry Heart - normal rate, regular rhythm, normal S1, S2, no murmurs, rubs, clicks or gallops Abdomen - soft, nontender, nondistended, no masses or organomegaly Breasts - CMA Dorena Dew present for breast and pelvic exam:  breasts appear normal, no suspicious masses, no skin or nipple changes or axillary nodes Pelvic - normal external genitalia, vulva, vagina, cervix, uterus and adnexa Extremities - peripheral pulses normal, no pedal edema, no clubbing or cyanosis Neuro: Cranial nerves grossly intact.  Actively move all 4 extremities.  Gross sensation intact. Diabetic Foot Exam - Simple   Simple Foot Form Diabetic Foot exam was performed with the following findings: Yes 08/02/2022  6:16 PM  Visual Inspection No  deformities, no ulcerations, no other skin breakdown bilaterally: Yes Sensation Testing Intact to touch and monofilament testing bilaterally: Yes Pulse Check Posterior Tibialis and Dorsalis pulse intact bilaterally: Yes Comments         Latest Ref Rng & Units 08/15/2019    9:35 AM 01/26/2019    9:16 AM 01/06/2019    3:52 PM  CMP  Glucose 70 - 99 mg/dL 300  126  120   BUN 6 - 20 mg/dL 55  33  40   Creatinine 0.44 - 1.00 mg/dL 4.65  3.80  3.75   Sodium 135 - 145 mmol/L 136  140  139   Potassium 3.5 - 5.1 mmol/L 5.4  5.0  4.6   Chloride 98 - 111 mmol/L 107  110  105   CO2 22 - 32 mmol/L _0 Calcium 8.9 - 10.3 mg/dL 9.2  9.5  9.4    Lipid Panel     Component Value Date/Time   CHOL 200 06/21/2021 1459   CHOL 227 (H) 05/27/2018 1602   TRIG 279.0 (H) 06/21/2021 1459   HDL 41.70 06/21/2021 1459   HDL 59 05/27/2018 1602   CHOLHDL 5 06/21/2021 1459   VLDL 55.8 (H) 06/21/2021 1459   LDLCALC 153 (H) 11/04/2018 0600   LDLCALC 140 (H) 05/27/2018 1602   LDLDIRECT 121.0 06/21/2021 1459    CBC    Component Value Date/Time   WBC 5.0 11/12/2018 1030   WBC 4.4 11/05/2018 0510   RBC 3.37 (L) 11/12/2018 1030   RBC 3.01 (L) 11/05/2018 0510   RBC 3.01 (L) 11/05/2018 0510   HGB 8.9 (L) 11/12/2018 1030   HGB 11.6 09/07/2011 1500   HCT 28.3 (L) 11/12/2018 1030   HCT 34.7 (L) 09/07/2011 1500   PLT 217 11/12/2018 1030   MCV 84 11/12/2018 1030   MCV 79.4 (L) 09/07/2011 1500   MCH 26.4 (L) 11/12/2018 1030   MCH 27.6 11/05/2018 0510   MCHC 31.4 (L)  11/12/2018 1030   MCHC 31.6 11/05/2018 0510   RDW 12.9 11/12/2018 1030   RDW 13.8 09/07/2011 1500   LYMPHSABS 1.6 11/12/2018 1030   LYMPHSABS 1.7 09/07/2011 1500   MONOABS 0.3 05/31/2018 1005   MONOABS 0.2 09/07/2011 1500   EOSABS 0.1 11/12/2018 1030   BASOSABS 0.0 11/12/2018 1030   BASOSABS 0.0 09/07/2011 1500    ASSESSMENT AND PLAN: 1. Annual physical exam   2. Pap smear for cervical cancer screening - Cytology - PAP -  Cervicovaginal ancillary only  3. Encounter for screening mammogram for malignant neoplasm of breast Discussed recommendations for breast cancer screening.  Some organizations recommend starting in the 69s while others including the [place] preventative task force recommends starting at age 34.  Advised of slight increased risk of false positive due to dense breast of patients in their 40s.  Using shared decision making, patient decided to start now - MM Digital Screening; Future  4. Hypertensive kidney disease Not at goal but reports home blood pressure readings have been good.  No changes made.  She will continue current dose of Norvasc  5. Headache syndrome - Ambulatory referral to Neurology  6. Type 1 diabetes mellitus with obesity (HCC) - Microalbumin / creatinine urine ratio  7. Need for influenza vaccination -Patient had agreed to receive this today but we forgot to give it.  I will have the [facility] call her to come as a nurse only visit to get it.  8. Need for third booster dose of COVID-19 vaccine Advised to get COVID booster vaccine from any outside pharmacy.  9. Need for hepatitis C screening test - Hepatitis C Antibody    Patient was given the opportunity to ask questions.  Patient verbalized understanding of the plan and was able to repeat key elements of the plan.   This documentation was completed using Radio producer.  Any transcriptional errors are unintentional.  No orders of the defined types were placed in this encounter.    Requested Prescriptions    No prescriptions requested or ordered in this encounter    No follow-ups on file.  Karle Plumber, MD, FACP

## 2022-08-03 ENCOUNTER — Telehealth: Payer: Self-pay

## 2022-08-03 ENCOUNTER — Ambulatory Visit: Payer: BLUE CROSS/BLUE SHIELD | Attending: Internal Medicine

## 2022-08-03 DIAGNOSIS — Z23 Encounter for immunization: Secondary | ICD-10-CM

## 2022-08-03 LAB — HEPATITIS C ANTIBODY: Hep C Virus Ab: NONREACTIVE

## 2022-08-03 LAB — MICROALBUMIN / CREATININE URINE RATIO
Creatinine, Urine: 118.3 mg/dL
Microalb/Creat Ratio: 102 mg/g creat — ABNORMAL HIGH (ref 0–29)
Microalbumin, Urine: 120.7 ug/mL

## 2022-08-03 NOTE — Progress Notes (Signed)
Patient identified by name and date of birth. Flu was given in left deltoid. Patient tolerated well.

## 2022-08-03 NOTE — Telephone Encounter (Signed)
-----   Message from Ladell Pier, MD sent at 08/02/2022  6:20 PM EDT ----- Regarding: flu shot We forgot to give it today.  Patient would like to come as a nurse only visit tomorrow or Monday to get it.  Can you please schedule and give her a call?  thanks

## 2022-08-03 NOTE — Telephone Encounter (Signed)
Vaccine given

## 2022-08-06 ENCOUNTER — Other Ambulatory Visit: Payer: Self-pay | Admitting: Internal Medicine

## 2022-08-06 ENCOUNTER — Encounter: Payer: Self-pay | Admitting: Neurology

## 2022-08-06 LAB — CERVICOVAGINAL ANCILLARY ONLY
Bacterial Vaginitis (gardnerella): POSITIVE — AB
Candida Glabrata: NEGATIVE
Candida Vaginitis: POSITIVE — AB
Chlamydia: NEGATIVE
Comment: NEGATIVE
Comment: NEGATIVE
Comment: NEGATIVE
Comment: NEGATIVE
Comment: NEGATIVE
Comment: NORMAL
Neisseria Gonorrhea: NEGATIVE
Trichomonas: NEGATIVE

## 2022-08-06 MED ORDER — FLUCONAZOLE 150 MG PO TABS
150.0000 mg | ORAL_TABLET | Freq: Every day | ORAL | 0 refills | Status: DC
  Filled 2022-08-06: qty 1, 1d supply, fill #0

## 2022-08-06 MED ORDER — METRONIDAZOLE 500 MG PO TABS
500.0000 mg | ORAL_TABLET | Freq: Two times a day (BID) | ORAL | 0 refills | Status: DC
  Filled 2022-08-06: qty 10, 5d supply, fill #0

## 2022-08-07 ENCOUNTER — Ambulatory Visit
Admission: RE | Admit: 2022-08-07 | Discharge: 2022-08-07 | Disposition: A | Discharge status: Home / Self Care | Payer: BLUE CROSS/BLUE SHIELD | Source: Ambulatory Visit | Attending: Internal Medicine | Admitting: Internal Medicine

## 2022-08-07 ENCOUNTER — Other Ambulatory Visit: Payer: Self-pay

## 2022-08-07 DIAGNOSIS — Z1231 Encounter for screening mammogram for malignant neoplasm of breast: Secondary | ICD-10-CM

## 2022-08-07 LAB — CYTOLOGY - PAP
Comment: NEGATIVE
Comment: NEGATIVE
Comment: NEGATIVE
Diagnosis: NEGATIVE
HPV 16: NEGATIVE
HPV 18 / 45: NEGATIVE
High risk HPV: POSITIVE — AB

## 2022-08-16 ENCOUNTER — Other Ambulatory Visit (HOSPITAL_COMMUNITY): Payer: Self-pay

## 2022-08-20 ENCOUNTER — Other Ambulatory Visit: Payer: Self-pay

## 2022-08-21 ENCOUNTER — Other Ambulatory Visit: Payer: Self-pay

## 2022-09-06 ENCOUNTER — Other Ambulatory Visit: Payer: Self-pay

## 2022-09-25 ENCOUNTER — Other Ambulatory Visit: Payer: Self-pay | Admitting: Family Medicine

## 2022-09-25 DIAGNOSIS — E1042 Type 1 diabetes mellitus with diabetic polyneuropathy: Secondary | ICD-10-CM

## 2022-09-26 ENCOUNTER — Other Ambulatory Visit: Payer: Self-pay

## 2022-09-26 MED ORDER — GABAPENTIN 300 MG PO CAPS
300.0000 mg | ORAL_CAPSULE | Freq: Two times a day (BID) | ORAL | 0 refills | Status: DC
  Filled 2022-09-26: qty 180, 90d supply, fill #0

## 2022-09-26 NOTE — Telephone Encounter (Signed)
Requested Prescriptions  Pending Prescriptions Disp Refills   gabapentin (NEURONTIN) 300 MG capsule 180 capsule 0    Sig: Take 1 capsule by mouth 2 (two) times daily.     Neurology: Anticonvulsants - gabapentin Failed - 09/25/2022  6:24 PM      Failed - Cr in normal range and within 360 days    Creat  Date Value Ref Range Status  04/17/2016 2.57 (H) 0.50 - 1.10 mg/dL Final   Creatinine, Ser  Date Value Ref Range Status  08/15/2019 4.65 (H) 0.44 - 1.00 mg/dL Final   Creatinine,U  Date Value Ref Range Status  06/21/2021 226.6 mg/dL Final   Creatinine, Urine  Date Value Ref Range Status  11/04/2018 84.10 mg/dL Final    Comment:    Performed at [facility], [place] [address], [place], 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

## 2022-09-27 ENCOUNTER — Other Ambulatory Visit: Payer: Self-pay

## 2022-10-04 DIAGNOSIS — H40013 Open angle with borderline findings, low risk, bilateral: Secondary | ICD-10-CM | POA: Diagnosis not present

## 2022-10-10 ENCOUNTER — Other Ambulatory Visit: Payer: Self-pay

## 2022-10-10 DIAGNOSIS — M25532 Pain in left wrist: Secondary | ICD-10-CM | POA: Diagnosis not present

## 2022-10-10 MED ORDER — DORZOLAMIDE HCL-TIMOLOL MAL 2-0.5 % OP SOLN
1.0000 [drp] | Freq: Two times a day (BID) | OPHTHALMIC | 3 refills | Status: DC
  Filled 2022-10-10: qty 10, 30d supply, fill #0
  Filled 2022-11-10: qty 10, 30d supply, fill #1

## 2022-10-10 MED ORDER — MELOXICAM 15 MG PO TABS
15.0000 mg | ORAL_TABLET | Freq: Every day | ORAL | 0 refills | Status: DC
  Filled 2022-10-10: qty 30, 30d supply, fill #0

## 2022-10-11 ENCOUNTER — Other Ambulatory Visit: Payer: Self-pay

## 2022-10-12 ENCOUNTER — Other Ambulatory Visit (HOSPITAL_COMMUNITY): Payer: Self-pay

## 2022-10-12 DIAGNOSIS — Z7952 Long term (current) use of systemic steroids: Secondary | ICD-10-CM | POA: Diagnosis not present

## 2022-10-12 DIAGNOSIS — Z8616 Personal history of COVID-19: Secondary | ICD-10-CM | POA: Diagnosis not present

## 2022-10-12 DIAGNOSIS — D649 Anemia, unspecified: Secondary | ICD-10-CM | POA: Diagnosis not present

## 2022-10-12 DIAGNOSIS — D8989 Other specified disorders involving the immune mechanism, not elsewhere classified: Secondary | ICD-10-CM | POA: Diagnosis not present

## 2022-10-12 DIAGNOSIS — B258 Other cytomegaloviral diseases: Secondary | ICD-10-CM | POA: Diagnosis not present

## 2022-10-12 DIAGNOSIS — E119 Type 2 diabetes mellitus without complications: Secondary | ICD-10-CM | POA: Diagnosis not present

## 2022-10-12 DIAGNOSIS — E785 Hyperlipidemia, unspecified: Secondary | ICD-10-CM | POA: Diagnosis not present

## 2022-10-12 DIAGNOSIS — E872 Acidosis, unspecified: Secondary | ICD-10-CM | POA: Diagnosis not present

## 2022-10-12 DIAGNOSIS — B259 Cytomegaloviral disease, unspecified: Secondary | ICD-10-CM | POA: Diagnosis not present

## 2022-10-12 DIAGNOSIS — I120 Hypertensive chronic kidney disease with stage 5 chronic kidney disease or end stage renal disease: Secondary | ICD-10-CM | POA: Diagnosis not present

## 2022-10-12 DIAGNOSIS — K219 Gastro-esophageal reflux disease without esophagitis: Secondary | ICD-10-CM | POA: Diagnosis not present

## 2022-10-12 DIAGNOSIS — I10 Essential (primary) hypertension: Secondary | ICD-10-CM | POA: Diagnosis not present

## 2022-10-12 DIAGNOSIS — Z79621 Long term (current) use of calcineurin inhibitor: Secondary | ICD-10-CM | POA: Diagnosis not present

## 2022-10-12 DIAGNOSIS — Z940 Kidney transplant status: Secondary | ICD-10-CM | POA: Diagnosis not present

## 2022-10-12 DIAGNOSIS — Z792 Long term (current) use of antibiotics: Secondary | ICD-10-CM | POA: Diagnosis not present

## 2022-10-12 DIAGNOSIS — Z794 Long term (current) use of insulin: Secondary | ICD-10-CM | POA: Diagnosis not present

## 2022-10-16 ENCOUNTER — Other Ambulatory Visit: Payer: Self-pay

## 2022-10-26 DIAGNOSIS — E103413 Type 1 diabetes mellitus with severe nonproliferative diabetic retinopathy with macular edema, bilateral: Secondary | ICD-10-CM | POA: Diagnosis not present

## 2022-11-05 ENCOUNTER — Other Ambulatory Visit: Payer: Self-pay

## 2022-11-06 DIAGNOSIS — E1036 Type 1 diabetes mellitus with diabetic cataract: Secondary | ICD-10-CM | POA: Diagnosis not present

## 2022-11-06 DIAGNOSIS — E103413 Type 1 diabetes mellitus with severe nonproliferative diabetic retinopathy with macular edema, bilateral: Secondary | ICD-10-CM | POA: Diagnosis not present

## 2022-11-06 DIAGNOSIS — H2513 Age-related nuclear cataract, bilateral: Secondary | ICD-10-CM | POA: Diagnosis not present

## 2022-11-15 ENCOUNTER — Other Ambulatory Visit: Payer: Self-pay

## 2022-11-15 DIAGNOSIS — H40013 Open angle with borderline findings, low risk, bilateral: Secondary | ICD-10-CM | POA: Diagnosis not present

## 2022-11-16 DIAGNOSIS — Z4822 Encounter for aftercare following kidney transplant: Secondary | ICD-10-CM | POA: Diagnosis not present

## 2022-12-04 ENCOUNTER — Encounter (HOSPITAL_COMMUNITY): Payer: Self-pay

## 2022-12-04 ENCOUNTER — Ambulatory Visit (INDEPENDENT_AMBULATORY_CARE_PROVIDER_SITE_OTHER): Payer: 59

## 2022-12-04 ENCOUNTER — Ambulatory Visit (HOSPITAL_COMMUNITY)
Admission: EM | Admit: 2022-12-04 | Discharge: 2022-12-04 | Disposition: A | Discharge status: Home / Self Care | Payer: 59 | Attending: Family Medicine | Admitting: Family Medicine

## 2022-12-04 ENCOUNTER — Other Ambulatory Visit: Payer: Self-pay

## 2022-12-04 DIAGNOSIS — M79672 Pain in left foot: Secondary | ICD-10-CM

## 2022-12-04 NOTE — ED Triage Notes (Signed)
Pt ius here for left foot pain x 9 days causing pain and discomfort .

## 2022-12-04 NOTE — Discharge Instructions (Addendum)
You were seen today for foot pain.  Your xray does not show anything new.  Possible old fracture.  We have given you a shoe to wear for comfort.  You may take tylenol/motrin for pain and keep foot elevated if possible.  I am not sure what is causing your pain. You may call [facility] to make an appointment if not improving.  Please call [phone].  I have also placed a referral.

## 2022-12-04 NOTE — ED Provider Notes (Signed)
[place]    CSN: [id number] Arrival date & time: 12/04/22  1401      History   Chief Complaint Chief Complaint  Patient presents with   Foot Pain    HPI Cathy Green is a 42 y.o. female.   Patient is here for left foot pain x 9 days.  No known injury.  Is at the whole left side of the left foot.   More painful the more she walks on it.  She went to bed Saturday night fine and woke up Sunday am with the pain.  She was at home Saturday, did not go out.  She has been soaking it, taking tylenol.  It was swollen for about a day, but that resolved.   She may have broken a bone in her foot as a child but never went to the doctor.       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 ([place]) 11/14/2020   Type 1 diabetes mellitus with stage 3b chronic kidney disease ([place]) 11/14/2020   Hyperlipidemia due to type 1 diabetes mellitus ([place]) 08/02/2020   Hypertensive kidney disease 08/02/2020   Kidney transplant recipient 08/02/2020   Hypoglycemia due to insulin 02/12/2020   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      Home Medications    Prior to Admission medications   Medication Sig Start Date End Date Taking? Authorizing Provider  amLODipine (NORVASC) 5 MG tablet Take 1 tablet (5 mg total) by mouth daily. 05/27/18  Yes Ladell Pier, MD  atorvastatin (LIPITOR) 20 MG tablet Take 1 tablet (20 mg total) by mouth daily. 07/19/22  Yes Shamleffer, Melanie Crazier, MD  Continuous Blood Gluc Sensor (DEXCOM G6 SENSOR) MISC 1 Device by Does not apply route as directed. 11/14/20  Yes Shamleffer, Melanie Crazier, MD  Continuous Blood Gluc Transmit (DEXCOM G6 TRANSMITTER) MISC 1 Device by Does not apply route as directed. 11/14/20  Yes Shamleffer, Melanie Crazier, MD  dorzolamide-timolol (COSOPT) 2-0.5 % ophthalmic solution Place 1 drop into both eyes 2 (two) times daily. 10/10/22  Yes   fluticasone (FLONASE) 50 MCG/ACT nasal spray Place 2 sprays into both nostrils daily. 02/06/21  Yes Ladell Pier, MD  gabapentin (NEURONTIN) 300 MG capsule Take 1 capsule (300 mg total)  by mouth 2 (two) times daily. 09/26/22  Yes Charlott Rakes, MD  glucose blood (CONTOUR NEXT TEST) test strip Use as instructed to check blood sugar TID. 07/19/20  Yes Ladell Pier, MD  insulin aspart (NOVOLOG FLEXPEN) 100 UNIT/ML FlexPen INJECT 10 UNITS INTO THE SKIN 3 (THREE) TIMES DAILY WITH MEALS. MAX DAILY 50 UNITS 07/19/22 07/19/23 Yes Shamleffer, Melanie Crazier, MD  insulin aspart (NOVOLOG) 100 UNIT/ML injection 50 units max daily dose. Use with pump 04/04/22  Yes Shamleffer, Melanie Crazier, MD  Insulin Disposable Pump (OMNIPOD 5 G6 POD, GEN 5,) MISC change pod every 3 days 06/19/22  Yes Shamleffer, Melanie Crazier, MD  meloxicam (MOBIC) 15 MG tablet Take 1 tablet (15 mg total) by mouth  daily. 10/10/22  Yes Orene Desanctis, MD  predniSONE (DELTASONE) 5 MG tablet Take 5 mg by mouth daily with breakfast.   Yes [provider]  sulfamethoxazole-trimethoprim (BACTRIM) 400-80 MG tablet Take by mouth. 12/29/21  Yes [provider]  tacrolimus (PROGRAF) 1 MG capsule Take 4 capsules in the morning and 3 capsules at night 07/26/20  Yes [provider]  valGANciclovir (VALCYTE) 450 MG tablet Take 900 mg by mouth 2 (two) times daily. 12/11/21  Yes [provider]  cyanocobalamin 1000 MCG tablet Take by mouth.    [provider]  fluconazole (DIFLUCAN) 150 MG tablet Take 1 tablet (150 mg total) by mouth daily. 08/06/22   Ladell Pier, MD  insulin aspart (NOVOLOG) 100 UNIT/ML FlexPen INJECT 5 UNITS INTO THE SKIN 3 TIMES DAILY BEFORE MEALS. 07/04/20 07/04/21  Reeves-Daniel, Amber, DO  Insulin Disposable Pump (OMNIPOD 5 G6 INTRO, GEN 5,) KIT use as directed : change pod every 3 days. 01/03/22   Shamleffer, Melanie Crazier, MD  metroNIDAZOLE (FLAGYL) 500 MG tablet Take 1 tablet (500 mg total) by mouth 2 (two) times daily. 08/06/22   Ladell Pier, MD  mycophenolate (MYFORTIC) 180 MG EC tablet Take by mouth. 06/24/20   [provider]  ondansetron (ZOFRAN) 4 MG tablet Take 1 tablet (4 mg total) by mouth every 8 (eight) hours as needed for Nausea / Vomiting. 12/08/21       Family History Family History  Problem Relation Age of Onset   Hypertension Mother    Stroke Mother    Hypertension Father    Cancer Maternal Grandfather        prostate   Breast cancer Cousin        maternal 1st cousin   Cancer Other    COPD Other    Hyperlipidemia Other     Social History Social History   Tobacco Use   Smoking status: Never   Smokeless tobacco: Never  Vaping Use   Vaping Use: Never used  Substance Use Topics   Alcohol use: Yes    Comment: occasional    Drug use: No     Allergies   Garlic   Review of Systems Review of Systems   Constitutional: Negative.   HENT: Negative.    Respiratory: Negative.    Cardiovascular: Negative.   Gastrointestinal: Negative.   Genitourinary: Negative.   Musculoskeletal:  Positive for gait problem.     Physical Exam Triage Vital Signs ED Triage Vitals  Enc Vitals Group     BP 12/04/22 1530 (!) 145/85     Pulse Rate 12/04/22 1530 91     Resp 12/04/22 1530 12     Temp 12/04/22 1530 98 F (36.7 C)     Temp Source 12/04/22 1530  Oral     SpO2 12/04/22 1530 98 %     Weight --      Height --      Head Circumference --      Peak Flow --      Pain Score 12/04/22 1525 8     Pain Loc --      Pain Edu? --      Excl. in [place]? --    No data found.  Updated Vital Signs BP (!) 145/85 (BP Location: Left Arm)   Pulse 91   Temp 98 F (36.7 C) (Oral)   Resp 12   LMP 12/04/2022   SpO2 98%   Visual Acuity Right Eye Distance:   Left Eye Distance:   Bilateral Distance:    Right Eye Near:   Left Eye Near:    Bilateral Near:     Physical Exam Constitutional:      Appearance: Normal appearance.  Musculoskeletal:     Comments: No obvious deformity noted to the left foot;   No ttp to the tib/fib.  No ttp to the ankle.  She has TTP to the  proximal foot, from the 3rd to the 5th metatarsal;  worsening pain at the 5th compared to the 3rd   Skin:    General: Skin is warm and dry.  Neurological:     General: No focal deficit present.     Mental Status: She is alert.  Psychiatric:        Mood and Affect: Mood normal.      UC Treatments / Results  Labs (all labs ordered are listed, but only abnormal results are displayed) Labs Reviewed - No data to display  EKG   Radiology DG Foot Complete Left  Result Date: 12/04/2022 CLINICAL DATA:  Acute left foot pain. EXAM: LEFT FOOT - COMPLETE 3+ VIEW COMPARISON:  None Available. FINDINGS: Focal sclerosis is seen involving the proximal portion of the fourth metatarsal most consistent with subacute to old stress fracture. No  definite acute fracture or dislocation is noted. Joint spaces are intact. IMPRESSION: Probable subacute to old stress fracture involving proximal portion of fourth metatarsal. Electronically Signed   By: Marijo Conception M.D.   On: 12/04/2022 16:11    Procedures Procedures (including critical care time)  Medications Ordered in UC Medications - No data to display  Initial Impression / Assessment and Plan / UC Course  I have reviewed the triage vital signs and the nursing notes.  Pertinent labs & imaging results that were available during my care of the patient were reviewed by me and considered in my medical decision making (see chart for details).   Final Clinical Impressions(s) / UC Diagnoses   Final diagnoses:  Left foot pain     Discharge Instructions      You were seen today for foot pain.  Your xray does not show anything new.  Possible old fracture.  We have given you a shoe to wear for comfort.  You may take tylenol/motrin for pain and keep foot elevated if possible.  I am not sure what is causing your pain. You may call [facility] to make an appointment if not improving.  Please call [phone].  I have also placed a referral.     ED Prescriptions   None    PDMP not reviewed this encounter.   Rondel Oh, MD 12/04/22 [phone]

## 2022-12-07 DIAGNOSIS — Z940 Kidney transplant status: Secondary | ICD-10-CM | POA: Diagnosis not present

## 2022-12-10 DIAGNOSIS — Z940 Kidney transplant status: Secondary | ICD-10-CM | POA: Diagnosis not present

## 2022-12-11 ENCOUNTER — Encounter: Payer: Self-pay | Admitting: Podiatry

## 2022-12-11 ENCOUNTER — Ambulatory Visit: Payer: 59 | Admitting: Podiatry

## 2022-12-11 DIAGNOSIS — M84375A Stress fracture, left foot, initial encounter for fracture: Secondary | ICD-10-CM

## 2022-12-11 NOTE — Progress Notes (Signed)
Subjective:  Patient ID: Cathy Green, female    DOB: [birth year],  MRN: [id number]  Chief Complaint  Patient presents with   Fracture    Left foot stress fracture     42 y.o. female presents with the above complaint.  Patient presents with complaint of left fourth metatarsal pain.  Patient states that she was diagnosed with stress fracture when she went to urgent care few weeks ago.  Is been causing her some pain she stands a lot on her foot she works in [employer] she has not seen MRIs prior to seeing me pain scale 7 out of 10 dull achy in nature.  She would like to discuss treatment options for it.  She has been in a surgical shoe since the injury/pain.   Review of Systems: Negative except as noted in the HPI. Denies N/V/F/Ch.  Past Medical History:  Diagnosis Date   Abnormal Pap smear    Allergy    seasonal allergies   Anemia    Chronic kidney disease    Diabetes mellitus    diagnosed age 75   Diabetes mellitus without complication ([place])    Glaucoma    Hyperlipidemia    Hypertension    Migraines    Noncompliance    Numbness in left leg     Current Outpatient Medications:    amLODipine (NORVASC) 5 MG tablet, Take 1 tablet (5 mg total) by mouth daily., Disp: 30 tablet, Rfl: 6   atorvastatin (LIPITOR) 20 MG tablet, Take 1 tablet (20 mg total) by mouth daily., Disp: 90 tablet, Rfl: 3   Continuous Blood Gluc Sensor (DEXCOM G6 SENSOR) MISC, 1 Device by Does not apply route as directed., Disp: 9 each, Rfl: 3   Continuous Blood Gluc Transmit (DEXCOM G6 TRANSMITTER) MISC, 1 Device by Does not apply route as directed., Disp: 1 each, Rfl: 3   cyanocobalamin 1000 MCG tablet, Take by mouth., Disp: , Rfl:    dorzolamide-timolol (COSOPT) 2-0.5 % ophthalmic solution, Place 1 drop into both eyes 2 (two) times daily., Disp: 30 mL, Rfl: 3   fluticasone (FLONASE) 50 MCG/ACT nasal spray, Place 2 sprays into both nostrils daily., Disp: 16 g, Rfl: 6   gabapentin (NEURONTIN) 300 MG capsule,  Take 1 capsule (300 mg total) by mouth 2 (two) times daily., Disp: 180 capsule, Rfl: 0   glucose blood (CONTOUR NEXT TEST) test strip, Use as instructed to check blood sugar TID., Disp: 100 each, Rfl: 6   insulin aspart (NOVOLOG) 100 UNIT/ML FlexPen, INJECT 5 UNITS INTO THE SKIN 3 TIMES DAILY BEFORE MEALS., Disp: 15 mL, Rfl: 2   insulin aspart (NOVOLOG) 100 UNIT/ML injection, 50 units max daily dose. Use with pump, Disp: 10 mL, Rfl: 11   Insulin Disposable Pump (OMNIPOD 5 G6 POD, GEN 5,) MISC, change pod every 3 days, Disp: 15 each, Rfl: 3   meloxicam (MOBIC) 15 MG tablet, Take 1 tablet (15 mg total) by mouth daily., Disp: 30 tablet, Rfl: 0   ondansetron (ZOFRAN) 4 MG tablet, Take 1 tablet (4 mg total) by mouth every 8 (eight) hours as needed for Nausea / Vomiting., Disp: 20 tablet, Rfl: 0   predniSONE (DELTASONE) 5 MG tablet, Take 5 mg by mouth daily with breakfast., Disp: , Rfl:    sulfamethoxazole-trimethoprim (BACTRIM) 400-80 MG tablet, Take by mouth., Disp: , Rfl:    tacrolimus (PROGRAF) 1 MG capsule, Take 4 capsules in the morning and 3 capsules at night, Disp: , Rfl:    valGANciclovir (VALCYTE) 450  MG tablet, Take 900 mg by mouth 2 (two) times daily., Disp: , Rfl:   Social History   Tobacco Use  Smoking Status Never  Smokeless Tobacco Never    Allergies  Allergen Reactions   Garlic Nausea And Vomiting   Objective:  There were no vitals filed for this visit. There is no height or weight on file to calculate BMI. Constitutional Well developed. Well nourished.  Vascular Dorsalis pedis pulses palpable bilaterally. Posterior tibial pulses palpable bilaterally. Capillary refill normal to all digits.  No cyanosis or clubbing noted. Pedal hair growth normal.  Neurologic Normal speech. Oriented to person, place, and time. Epicritic sensation to light touch grossly present bilaterally.  Dermatologic Nails well groomed and normal in appearance. No open wounds. No skin lesions.   Orthopedic: Pain on palpation along the course of the fourth metatarsal no pain with range of motion of the fourth digit Lisfranc interval is intact.  No midfoot arthritis pain.  No pain at the extensor tendon.   Radiographs: 3 views of skeletally mature left foot: Stress fracture noted of the fourth metatarsal base at the diaphysis metaphysis junction.  No acute fractures noted no other abnormalities noted Assessment:   1. Stress fracture of metatarsal bone of left foot, initial encounter    Plan:  Patient was evaluated and treated and all questions answered.  Left fourth metatarsal stress fracture -All questions and concerns were discussed with the patient in extensive detail -Given the amount of pain that is present she will benefit from cam boot immobilization Cam boot was dispensed.  She will need to wear the boot for next 6 weeks.  I will see her back again in 6 weeks to reassess. -If there is no improvement we will extend the boot versus surgical options. -She will also benefit from bone stimulator to increase osseous healing and prevent surgery.  She agrees with the plan  No follow-ups on file.    Left fourth metatarsal stress fracture cam boot immobilization Cam boot dispensed  Bone stimulator

## 2022-12-13 DIAGNOSIS — E103413 Type 1 diabetes mellitus with severe nonproliferative diabetic retinopathy with macular edema, bilateral: Secondary | ICD-10-CM | POA: Diagnosis not present

## 2022-12-17 ENCOUNTER — Other Ambulatory Visit: Payer: Self-pay | Admitting: Internal Medicine

## 2022-12-17 ENCOUNTER — Other Ambulatory Visit: Payer: Self-pay | Admitting: Family Medicine

## 2022-12-17 ENCOUNTER — Other Ambulatory Visit: Payer: Self-pay

## 2022-12-17 DIAGNOSIS — N179 Acute kidney failure, unspecified: Secondary | ICD-10-CM | POA: Diagnosis not present

## 2022-12-17 DIAGNOSIS — Z4822 Encounter for aftercare following kidney transplant: Secondary | ICD-10-CM | POA: Diagnosis not present

## 2022-12-17 DIAGNOSIS — Z940 Kidney transplant status: Secondary | ICD-10-CM | POA: Diagnosis not present

## 2022-12-17 DIAGNOSIS — R739 Hyperglycemia, unspecified: Secondary | ICD-10-CM | POA: Diagnosis not present

## 2022-12-17 DIAGNOSIS — E1042 Type 1 diabetes mellitus with diabetic polyneuropathy: Secondary | ICD-10-CM

## 2022-12-17 MED ORDER — INSULIN ASPART 100 UNIT/ML IJ SOLN
INTRAMUSCULAR | 11 refills | Status: DC
  Filled 2022-12-17 – 2022-12-18 (×2): qty 10, 20d supply, fill #0
  Filled 2023-01-10 – 2023-01-17 (×2): qty 10, 20d supply, fill #1
  Filled 2023-02-06: qty 10, 20d supply, fill #2
  Filled 2023-02-20 – 2023-03-12 (×2): qty 10, 20d supply, fill #3

## 2022-12-17 MED ORDER — GABAPENTIN 300 MG PO CAPS
300.0000 mg | ORAL_CAPSULE | Freq: Two times a day (BID) | ORAL | 1 refills | Status: DC
  Filled 2022-12-17: qty 60, 30d supply, fill #0
  Filled 2023-01-10 – 2023-01-16 (×2): qty 60, 30d supply, fill #1
  Filled 2023-02-06 – 2023-02-20 (×2): qty 60, 30d supply, fill #2
  Filled 2023-03-12 – 2023-03-18 (×2): qty 60, 30d supply, fill #3
  Filled 2023-04-14: qty 60, 30d supply, fill #4
  Filled 2023-05-17: qty 60, 30d supply, fill #5

## 2022-12-18 ENCOUNTER — Other Ambulatory Visit: Payer: Self-pay

## 2022-12-18 NOTE — Progress Notes (Unsigned)
NEUROLOGY CONSULTATION NOTE  Cathy Green MRN: [id number] DOB: 1981-04-23  Referring provider: Karle Plumber, MD Primary care provider: Karle Plumber, MD  Reason for consult:  migraine  Assessment/Plan:   Migraine without aura, without status migrainosus, intractable  Migraine prevention:  start nortriptyline 10mg  at bedtime.  We can increase to 25mg  at bedtime in 4 weeks if needed. Migraine rescue:  rizatriptan 10mg ; Zofran for nausea Limit use of pain relievers to no more than 2 days out of week to prevent risk of rebound or medication-overuse headache. Keep headache diary Follow up 4 to 5 months.    Subjective:  Cathy Green is a 42 year old female with HTN, HLD, DM I, CKD, and glaucoma who presents for headache.  History supplemented by referring provider's note.  Onset:  42 years old.  Worse over past 2 years. Location:  may start over right eye but progresses to holocephalic Quality:  pressure, throbbing, stabbing (right eye) Intensity:  10/10.   Aura:  absent Prodrome:  absent Associated symptoms:  Blurred vision, nausea, photophobia, phonophobia.  She denies associated   vomiting, osmophobia, unilateral numbness or weakness. Duration:  2 days on average Frequency:  1 to 2 times a week (total 5-6 days a week) Frequency of abortive medication: 0 Triggers:  menstrual cycle, weather Relieving factors:  cold mask, dark room, rest Activity:  aggravate  Past NSAIDS/analgesics:  acetaminophen, ASA, ibuprofen - CANNOT TAKE NSAIDS DUE TO KIDNEY DISEASE Past abortive triptans:  unsure Past abortive ergotamine:  none Past muscle relaxants:  methocarbamol Past anti-emetic:  promethazine Past antihypertensive medications:  carvedilol, lisinopril Past antidepressant medications:  none Past anticonvulsant medications:  none Past anti-CGRP:  none Past vitamins/Herbal/Supplements:  none Past antihistamines/decongestants:  none Other past therapies:  none  Current  NSAIDS/analgesics:  none Current triptans:  none Current ergotamine:  none Current anti-emetic:  ondansetron 4mg  Current muscle relaxants:  none Current Antihypertensive medications:  amlodipine 5mg  daily Current Antidepressant medications:  gabapentin 300mg  BID (neuralgia) Current Anticonvulsant medications:  none Current anti-CGRP:  none Current Vitamins/Herbal/Supplements:  none Current Antihistamines/Decongestants:  Flonase Other therapy:  none Birth control:  no Other medication:  prednisone 5mg  daily  Eye exam okay  BUN 35, Cr 1.96, GFR 32  Caffeine:  1 cup coffee 3 times a week.   Diet:  at least 64 oz water.  Decaff soda.  Skips meals Exercise:  no Depression:  no; Anxiety:  no Other pain:  no Sleep hygiene:  good.  At least 6 hours sleep a night. Family history of headache:  no      PAST MEDICAL HISTORY: Past Medical History:  Diagnosis Date   Abnormal Pap smear    Allergy    seasonal allergies   Anemia    Chronic kidney disease    Diabetes mellitus    diagnosed age 51   Diabetes mellitus without complication ([place])    Glaucoma    Hyperlipidemia    Hypertension    Migraines    Noncompliance    Numbness in left leg     PAST SURGICAL HISTORY: Past Surgical History:  Procedure Laterality Date   KIDNEY TRANSPLANT  06/2020   WISDOM TOOTH EXTRACTION      MEDICATIONS: Current Outpatient Medications on File Prior to Visit  Medication Sig Dispense Refill   amLODipine (NORVASC) 5 MG tablet Take 1 tablet (5 mg total) by mouth daily. 30 tablet 6   atorvastatin (LIPITOR) 20 MG tablet Take 1 tablet (20 mg total) by mouth daily. [place]  tablet 3   Continuous Blood Gluc Sensor (DEXCOM G6 SENSOR) MISC 1 Device by Does not apply route as directed. 9 each 3   Continuous Blood Gluc Transmit (DEXCOM G6 TRANSMITTER) MISC 1 Device by Does not apply route as directed. 1 each 3   cyanocobalamin 1000 MCG tablet Take by mouth.     dorzolamide-timolol (COSOPT) 2-0.5 %  ophthalmic solution Place 1 drop into both eyes 2 (two) times daily. 30 mL 3   fluticasone (FLONASE) 50 MCG/ACT nasal spray Place 2 sprays into both nostrils daily. 16 g 6   gabapentin (NEURONTIN) 300 MG capsule Take 1 capsule (300 mg total) by mouth 2 (two) times daily. 180 capsule 1   glucose blood (CONTOUR NEXT TEST) test strip Use as instructed to check blood sugar TID. 100 each 6   insulin aspart (NOVOLOG) 100 UNIT/ML FlexPen INJECT 5 UNITS INTO THE SKIN 3 TIMES DAILY BEFORE MEALS. 15 mL 2   insulin aspart (NOVOLOG) 100 UNIT/ML injection 50 units max daily dose. Use with pump 10 mL 11   Insulin Disposable Pump (OMNIPOD 5 G6 POD, GEN 5,) MISC change pod every 3 days 15 each 3   meloxicam (MOBIC) 15 MG tablet Take 1 tablet (15 mg total) by mouth daily. 30 tablet 0   ondansetron (ZOFRAN) 4 MG tablet Take 1 tablet (4 mg total) by mouth every 8 (eight) hours as needed for Nausea / Vomiting. 20 tablet 0   predniSONE (DELTASONE) 5 MG tablet Take 5 mg by mouth daily with breakfast.     sulfamethoxazole-trimethoprim (BACTRIM) 400-80 MG tablet Take by mouth.     tacrolimus (PROGRAF) 1 MG capsule Take 4 capsules in the morning and 3 capsules at night     valGANciclovir (VALCYTE) 450 MG tablet Take 900 mg by mouth 2 (two) times daily.     No current facility-administered medications on file prior to visit.    ALLERGIES: Allergies  Allergen Reactions   Garlic Nausea And Vomiting    FAMILY HISTORY: Family History  Problem Relation Age of Onset   Hypertension Mother    Stroke Mother    Hypertension Father    Cancer Maternal Grandfather        prostate   Breast cancer Cousin        maternal 1st cousin   Cancer Other    COPD Other    Hyperlipidemia Other     Objective:  Blood pressure 139/83, pulse 93, height 5\' 1"  (1.549 m), weight 190 lb 9.6 oz (86.5 kg), last menstrual period 12/04/2022, SpO2 98 %. General: No acute distress.  Patient appears well-groomed.   Head:   Normocephalic/atraumatic Eyes:  fundi examined but not visualized Neck: supple, no paraspinal tenderness, full range of motion Back: No paraspinal tenderness Heart: regular rate and rhythm Lungs: Clear to auscultation bilaterally. Vascular: No carotid bruits. Neurological Exam: Mental status: alert and oriented to person, place, and time, speech fluent and not dysarthric, language intact. Cranial nerves: CN I: not tested CN II: pupils equal, round and reactive to light, visual fields intact CN III, IV, VI:  full range of motion, no nystagmus, no ptosis CN V: facial sensation intact. CN VII: upper and lower face symmetric CN VIII: hearing intact CN IX, X: gag intact, uvula midline CN XI: sternocleidomastoid and trapezius muscles intact CN XII: tongue midline Bulk & Tone: normal, no fasciculations. Motor:  muscle strength 5/5 throughout Sensation:  Pinprick, temperature and vibratory sensation intact. Deep Tendon Reflexes:  2+ throughout,  toes downgoing.  Finger to nose testing:  Without dysmetria.   Heel to shin:  Without dysmetria.   Gait:  Normal station and stride.  Romberg negative.    Thank you for allowing me to take part in the care of this patient.  Metta Clines, DO  CC: Karle Plumber, MD

## 2022-12-19 DIAGNOSIS — M79676 Pain in unspecified toe(s): Secondary | ICD-10-CM

## 2022-12-20 ENCOUNTER — Ambulatory Visit (INDEPENDENT_AMBULATORY_CARE_PROVIDER_SITE_OTHER): Payer: 59 | Admitting: Neurology

## 2022-12-20 ENCOUNTER — Other Ambulatory Visit: Payer: Self-pay

## 2022-12-20 ENCOUNTER — Encounter: Payer: Self-pay | Admitting: Neurology

## 2022-12-20 VITALS — BP 139/83 | HR 93 | Ht 61.0 in | Wt 190.6 lb

## 2022-12-20 DIAGNOSIS — G43019 Migraine without aura, intractable, without status migrainosus: Secondary | ICD-10-CM

## 2022-12-20 MED ORDER — RIZATRIPTAN BENZOATE 10 MG PO TBDP
10.0000 mg | ORAL_TABLET | ORAL | 5 refills | Status: DC | PRN
  Filled 2022-12-20: qty 9, 30d supply, fill #0
  Filled 2023-02-06: qty 9, 30d supply, fill #1

## 2022-12-20 MED ORDER — NORTRIPTYLINE HCL 10 MG PO CAPS
10.0000 mg | ORAL_CAPSULE | Freq: Every day | ORAL | 5 refills | Status: DC
  Filled 2022-12-20: qty 30, 30d supply, fill #0
  Filled 2023-01-16: qty 30, 30d supply, fill #1
  Filled 2023-02-06 – 2023-02-20 (×2): qty 30, 30d supply, fill #2
  Filled 2023-03-12 – 2023-03-18 (×2): qty 30, 30d supply, fill #3
  Filled 2023-04-14: qty 30, 30d supply, fill #4

## 2022-12-20 NOTE — Patient Instructions (Signed)
  Start nortriptyline 10mg  at bedtime.  Contact us in 4 weeks with update and we can increase dose if needed. Take rizatriptan 10mg  at earliest onset of headache.  May repeat dose once in 2 hours if needed.  Maximum 2 tablets in 24 hours. Use ondansetron for nausea as needed. Limit use of pain relievers to no more than 2 days out of the week.  These medications include acetaminophen, NSAIDs (ibuprofen/Advil/Motrin, naproxen/Aleve, triptans (Imitrex/sumatriptan), Excedrin, and narcotics.  This will help reduce risk of rebound headaches. Routine exercise Stay adequately hydrated (aim for 64 oz water daily) Keep headache diary Maintain proper stress management Maintain proper sleep hygiene Do not skip meals Follow up in 4 to 5 months.

## 2022-12-26 ENCOUNTER — Other Ambulatory Visit: Payer: Self-pay

## 2022-12-26 DIAGNOSIS — H40013 Open angle with borderline findings, low risk, bilateral: Secondary | ICD-10-CM | POA: Diagnosis not present

## 2023-01-08 ENCOUNTER — Ambulatory Visit: Payer: Medicaid Other | Admitting: Internal Medicine

## 2023-01-10 ENCOUNTER — Other Ambulatory Visit: Payer: Self-pay | Admitting: Internal Medicine

## 2023-01-11 ENCOUNTER — Other Ambulatory Visit: Payer: Self-pay

## 2023-01-11 ENCOUNTER — Other Ambulatory Visit (HOSPITAL_BASED_OUTPATIENT_CLINIC_OR_DEPARTMENT_OTHER): Payer: Self-pay

## 2023-01-11 MED ORDER — OMNIPOD 5 DEXG7G6 PODS GEN 5 MISC
1.0000 | 3 refills | Status: DC
  Filled 2023-01-11 – 2023-01-18 (×2): qty 10, 30d supply, fill #0
  Filled 2023-02-06 – 2023-02-20 (×2): qty 10, 30d supply, fill #1

## 2023-01-17 ENCOUNTER — Other Ambulatory Visit: Payer: Self-pay

## 2023-01-18 ENCOUNTER — Other Ambulatory Visit: Payer: Self-pay

## 2023-01-18 DIAGNOSIS — E103413 Type 1 diabetes mellitus with severe nonproliferative diabetic retinopathy with macular edema, bilateral: Secondary | ICD-10-CM | POA: Diagnosis not present

## 2023-01-23 ENCOUNTER — Encounter: Payer: Self-pay | Admitting: Podiatry

## 2023-01-23 ENCOUNTER — Ambulatory Visit (INDEPENDENT_AMBULATORY_CARE_PROVIDER_SITE_OTHER): Payer: 59

## 2023-01-23 ENCOUNTER — Ambulatory Visit: Payer: 59 | Admitting: Podiatry

## 2023-01-23 ENCOUNTER — Ambulatory Visit (INDEPENDENT_AMBULATORY_CARE_PROVIDER_SITE_OTHER): Payer: 59 | Admitting: Podiatry

## 2023-01-23 DIAGNOSIS — Z940 Kidney transplant status: Secondary | ICD-10-CM | POA: Diagnosis not present

## 2023-01-23 DIAGNOSIS — M21961 Unspecified acquired deformity of right lower leg: Secondary | ICD-10-CM | POA: Diagnosis not present

## 2023-01-23 DIAGNOSIS — E1136 Type 2 diabetes mellitus with diabetic cataract: Secondary | ICD-10-CM | POA: Diagnosis not present

## 2023-01-23 DIAGNOSIS — G43909 Migraine, unspecified, not intractable, without status migrainosus: Secondary | ICD-10-CM | POA: Diagnosis not present

## 2023-01-23 DIAGNOSIS — Z794 Long term (current) use of insulin: Secondary | ICD-10-CM | POA: Diagnosis not present

## 2023-01-23 DIAGNOSIS — M21962 Unspecified acquired deformity of left lower leg: Secondary | ICD-10-CM

## 2023-01-23 DIAGNOSIS — K219 Gastro-esophageal reflux disease without esophagitis: Secondary | ICD-10-CM | POA: Diagnosis not present

## 2023-01-23 DIAGNOSIS — Q666 Other congenital valgus deformities of feet: Secondary | ICD-10-CM | POA: Diagnosis not present

## 2023-01-23 DIAGNOSIS — M84375A Stress fracture, left foot, initial encounter for fracture: Secondary | ICD-10-CM

## 2023-01-23 DIAGNOSIS — Z7952 Long term (current) use of systemic steroids: Secondary | ICD-10-CM | POA: Diagnosis not present

## 2023-01-23 DIAGNOSIS — B259 Cytomegaloviral disease, unspecified: Secondary | ICD-10-CM | POA: Diagnosis not present

## 2023-01-23 DIAGNOSIS — Z6834 Body mass index (BMI) 34.0-34.9, adult: Secondary | ICD-10-CM | POA: Diagnosis not present

## 2023-01-23 DIAGNOSIS — E1165 Type 2 diabetes mellitus with hyperglycemia: Secondary | ICD-10-CM | POA: Diagnosis not present

## 2023-01-23 DIAGNOSIS — E1122 Type 2 diabetes mellitus with diabetic chronic kidney disease: Secondary | ICD-10-CM | POA: Diagnosis not present

## 2023-01-23 DIAGNOSIS — E669 Obesity, unspecified: Secondary | ICD-10-CM | POA: Diagnosis not present

## 2023-01-23 DIAGNOSIS — E785 Hyperlipidemia, unspecified: Secondary | ICD-10-CM | POA: Diagnosis not present

## 2023-01-23 NOTE — Progress Notes (Unsigned)
Subjective:  Patient ID: Cathy Green, female    DOB: 08-Mar-1981,  MRN: [id number]  Chief Complaint  Patient presents with   Fracture    Left foot stress fracture follow up  Pt stated that the pain comes and goes when she does have pain she stated that it is a 2 or 3 out of 10.     42 y.o. female presents with the above complaint.  Patient presents with follow-up of left fourth metatarsal stress fracture.  She states she is doing better pain has improved considerably.  She has been ambulating with cam boot.  She would also like to discuss shoe gear changes as well as orthotics option.  Denies any other acute complaints   Review of Systems: Negative except as noted in the HPI. Denies N/V/F/Ch.  Past Medical History:  Diagnosis Date   Abnormal Pap smear    Allergy    seasonal allergies   Anemia    Chronic kidney disease    Diabetes mellitus    diagnosed age 65   Diabetes mellitus without complication (HCC)    Glaucoma    Hyperlipidemia    Hypertension    Migraines    Noncompliance    Numbness in left leg     Current Outpatient Medications:    amLODipine (NORVASC) 5 MG tablet, Take 1 tablet (5 mg total) by mouth daily., Disp: 30 tablet, Rfl: 6   atorvastatin (LIPITOR) 20 MG tablet, Take 1 tablet (20 mg total) by mouth daily., Disp: 90 tablet, Rfl: 3   Continuous Blood Gluc Sensor (DEXCOM G6 SENSOR) MISC, 1 Device by Does not apply route as directed., Disp: 9 each, Rfl: 3   Continuous Blood Gluc Transmit (DEXCOM G6 TRANSMITTER) MISC, 1 Device by Does not apply route as directed., Disp: 1 each, Rfl: 3   cyanocobalamin 1000 MCG tablet, Take by mouth., Disp: , Rfl:    fluticasone (FLONASE) 50 MCG/ACT nasal spray, Place 2 sprays into both nostrils daily., Disp: 16 g, Rfl: 6   gabapentin (NEURONTIN) 300 MG capsule, Take 1 capsule (300 mg total) by mouth 2 (two) times daily., Disp: 180 capsule, Rfl: 1   glucose blood (CONTOUR NEXT TEST) test strip, Use as instructed to check blood  sugar TID., Disp: 100 each, Rfl: 6   insulin aspart (NOVOLOG) 100 UNIT/ML injection, Inject 50 units max daily dose. Use with pump, Disp: 10 mL, Rfl: 11   Insulin Disposable Pump (OMNIPOD 5 G6 PODS, GEN 5,) MISC, Use as directed and change pod every 3 days, Disp: 15 each, Rfl: 3   nortriptyline (PAMELOR) 10 MG capsule, Take 1 capsule (10 mg total) by mouth at bedtime., Disp: 30 capsule, Rfl: 5   ondansetron (ZOFRAN) 4 MG tablet, Take 1 tablet (4 mg total) by mouth every 8 (eight) hours as needed for Nausea / Vomiting., Disp: 20 tablet, Rfl: 0   predniSONE (DELTASONE) 5 MG tablet, Take 5 mg by mouth daily with breakfast., Disp: , Rfl:    rizatriptan (MAXALT-MLT) 10 MG disintegrating tablet, Take 1 tablet (10 mg total) by mouth as needed for migraine. May repeat in 2 hours if needed.  Maximum 2 tablets in 24 hours., Disp: 9 tablet, Rfl: 5   sulfamethoxazole-trimethoprim (BACTRIM) 400-80 MG tablet, Take by mouth., Disp: , Rfl:    tacrolimus (PROGRAF) 1 MG capsule, Take 4 capsules in the morning and 3 capsules at night, Disp: , Rfl:   Social History   Tobacco Use  Smoking Status Never  Smokeless Tobacco Never  Allergies  Allergen Reactions   Garlic Nausea And Vomiting   Objective:  There were no vitals filed for this visit. There is no height or weight on file to calculate BMI. Constitutional Well developed. Well nourished.  Vascular Dorsalis pedis pulses palpable bilaterally. Posterior tibial pulses palpable bilaterally. Capillary refill normal to all digits.  No cyanosis or clubbing noted. Pedal hair growth normal.  Neurologic Normal speech. Oriented to person, place, and time. Epicritic sensation to light touch grossly present bilaterally.  Dermatologic Nails well groomed and normal in appearance. No open wounds. No skin lesions.  Orthopedic: No further pain on palpation along the course of the fourth metatarsal no pain with range of motion of the fourth digit Lisfranc interval  is intact.  No midfoot arthritis pain.  No pain at the extensor tendon.   Radiographs: 3 views of skeletally mature left foot: Stress fracture noted of the fourth metatarsal base at the diaphysis metaphysis junction.  No acute fractures noted no other abnormalities noted Assessment:   1. Stress fracture of metatarsal bone of left foot, initial encounter   2. Pes planovalgus    Plan:  Patient was evaluated and treated and all questions answered.  Left fourth metatarsal stress fracture -All questions and concerns were discussed with the patient in extensive detail -Clinically her pain has completely resolved.  At this time patient can return to regular shoes.  If any foot and ankle issues arise in the future she will come back and see me -  Pes planovalgus -I explained to patient the etiology of pes planovalgus and relationship with arch pain and various treatment options were discussed.  Given patient foot structure in the setting of arch pain I believe patient will benefit from custom-made orthotics to help control the hindfoot motion support the arch of the foot and take the stress away from plantar fascial.  Patient agrees with the plan like to proceed with orthotics -Patient was casted for orthotics  No follow-ups on file.

## 2023-02-01 DIAGNOSIS — Z794 Long term (current) use of insulin: Secondary | ICD-10-CM | POA: Diagnosis not present

## 2023-02-01 DIAGNOSIS — E119 Type 2 diabetes mellitus without complications: Secondary | ICD-10-CM | POA: Diagnosis not present

## 2023-02-01 DIAGNOSIS — Z79899 Other long term (current) drug therapy: Secondary | ICD-10-CM | POA: Diagnosis not present

## 2023-02-01 DIAGNOSIS — I10 Essential (primary) hypertension: Secondary | ICD-10-CM | POA: Diagnosis not present

## 2023-02-01 DIAGNOSIS — Z940 Kidney transplant status: Secondary | ICD-10-CM | POA: Diagnosis not present

## 2023-02-01 DIAGNOSIS — D849 Immunodeficiency, unspecified: Secondary | ICD-10-CM | POA: Diagnosis not present

## 2023-02-01 DIAGNOSIS — B259 Cytomegaloviral disease, unspecified: Secondary | ICD-10-CM | POA: Diagnosis not present

## 2023-02-07 ENCOUNTER — Other Ambulatory Visit: Payer: Self-pay

## 2023-02-08 ENCOUNTER — Other Ambulatory Visit: Payer: Self-pay

## 2023-02-21 ENCOUNTER — Other Ambulatory Visit: Payer: Self-pay

## 2023-02-27 DIAGNOSIS — E103413 Type 1 diabetes mellitus with severe nonproliferative diabetic retinopathy with macular edema, bilateral: Secondary | ICD-10-CM | POA: Diagnosis not present

## 2023-03-12 ENCOUNTER — Other Ambulatory Visit: Payer: Self-pay

## 2023-03-13 ENCOUNTER — Ambulatory Visit (INDEPENDENT_AMBULATORY_CARE_PROVIDER_SITE_OTHER): Payer: 59 | Admitting: Internal Medicine

## 2023-03-13 ENCOUNTER — Encounter: Payer: Self-pay | Admitting: Internal Medicine

## 2023-03-13 VITALS — BP 138/84 | HR 80 | Ht 61.0 in | Wt 191.0 lb

## 2023-03-13 DIAGNOSIS — N1832 Chronic kidney disease, stage 3b: Secondary | ICD-10-CM | POA: Diagnosis not present

## 2023-03-13 DIAGNOSIS — E1022 Type 1 diabetes mellitus with diabetic chronic kidney disease: Secondary | ICD-10-CM | POA: Diagnosis not present

## 2023-03-13 DIAGNOSIS — E103413 Type 1 diabetes mellitus with severe nonproliferative diabetic retinopathy with macular edema, bilateral: Secondary | ICD-10-CM | POA: Diagnosis not present

## 2023-03-13 DIAGNOSIS — E1065 Type 1 diabetes mellitus with hyperglycemia: Secondary | ICD-10-CM

## 2023-03-13 DIAGNOSIS — Z940 Kidney transplant status: Secondary | ICD-10-CM | POA: Diagnosis not present

## 2023-03-13 DIAGNOSIS — E1042 Type 1 diabetes mellitus with diabetic polyneuropathy: Secondary | ICD-10-CM

## 2023-03-13 DIAGNOSIS — E1069 Type 1 diabetes mellitus with other specified complication: Secondary | ICD-10-CM

## 2023-03-13 DIAGNOSIS — E785 Hyperlipidemia, unspecified: Secondary | ICD-10-CM | POA: Diagnosis not present

## 2023-03-13 LAB — POCT GLUCOSE (DEVICE FOR HOME USE): POC Glucose: 198 mg/dl — AB (ref 70–99)

## 2023-03-13 LAB — POCT GLYCOSYLATED HEMOGLOBIN (HGB A1C): Hemoglobin A1C: 11.2 % — AB (ref 4.0–5.6)

## 2023-03-13 NOTE — Patient Instructions (Signed)

## 2023-03-13 NOTE — Progress Notes (Signed)
Name: Cathy Green  Age/ Sex: 42 y.o., female   MRN/ DOB: [id number], January 23, 1981     PCP: Marcine Matar, MD   Reason for Endocrinology Evaluation: Type 1 Diabetes Mellitus  Initial Endocrine Consultative Visit: 11/14/2020    PATIENT IDENTIFIER: Cathy Green is a 42 y.o. female with a past medical history of T1DM , HTN and Dyslipidemia and hx of renal transplant. The patient has followed with Endocrinology clinic since 11/14/2020 for consultative assistance with management of her diabetes.  DIABETIC HISTORY:  Cathy Green was diagnosed with DM at age 89, Metformin caused GI side effects .Marland Kitchen Her hemoglobin A1c has ranged from 6.2% in 01/2020, peaking at 8.1% in 08/2020.  S/P renal transplant 06/2020- Through [facility]  No hx of dialysis    On her initial visit to our clinic she had an A1c of 6.5 %, we adjusted MDI regimen and provided her with correction scale  She was started on OmniPod in July 2023   SUBJECTIVE:   During the last visit (07/09/2022): A1c 8.1%     Today (03/13/2023): Cathy Green is here for a follow up on diabetes.  She has not been using the dexcom because she thre    She continues to follow-up with kidney transplant team She follows with podiatry for left foot stress fracture 11/2022 Denies nausea or vomiting  Denies constipation or diarrhea    This patient with type 1 diabetes is treated with Omnipod (insulin pump). During the visit the pump basal and bolus doses were reviewed including carb/insulin rations and supplemental doses. The clinical list was updated. The glucose meter download was reviewed in detail to determine if the current pump settings are providing the best glycemic control without excessive hypoglycemia.  Pump and meter download:    Pump   Omnipod Settings   Insulin type   Novolog   Basal rate       0000 1.10 u/h               I:C ratio       0000 1:12    1700 1:10              Sensitivity       0000  35      Goal        0000  130             Type & Model of Pump: Omnipod Insulin Type: Currently using Novolog .  PUMP STATISTICS: No data   HOME DIABETES REGIMEN:  Novolog      Statin: yes ACE-I/ARB: no Prior Diabetic Education: yes    CONTINUOUS GLUCOSE MONITORING RECORD INTERPRETATION : No data     DIABETIC COMPLICATIONS: Microvascular complications:  Neuropathy, CKD (S/P transplant 2021), retinopathy ( S/P injections) Last eye exam: Completed 05/22/2022   Macrovascular complications:   Denies: CAD, CVA, PVD   HISTORY:  Past Medical History:  Past Medical History:  Diagnosis Date   Abnormal Pap smear    Allergy    seasonal allergies   Anemia    Chronic kidney disease    Diabetes mellitus    diagnosed age 67   Diabetes mellitus without complication (HCC)    Glaucoma    Hyperlipidemia    Hypertension    Migraines    Noncompliance    Numbness in left leg    Past Surgical History:  Past Surgical History:  Procedure Laterality Date   KIDNEY TRANSPLANT  06/2020   WISDOM TOOTH EXTRACTION  Social History:  reports that she has never smoked. She has never used smokeless tobacco. She reports current alcohol use. She reports that she does not use drugs. Family History:  Family History  Problem Relation Age of Onset   Hypertension Mother    Stroke Mother    Hypertension Father    Cancer Maternal Grandfather        prostate   Breast cancer Cousin        maternal 1st cousin   Cancer Other    COPD Other    Hyperlipidemia Other      HOME MEDICATIONS: Allergies as of 03/13/2023       Reactions   Garlic Nausea And Vomiting        Medication List        Accurate as of March 13, 2023  3:12 PM. If you have any questions, ask your nurse or doctor.          amLODipine 5 MG tablet Commonly known as: NORVASC Take 1 tablet (5 mg total) by mouth daily.   atorvastatin 20 MG tablet Commonly known as: LIPITOR Take 1 tablet (20 mg total) by mouth  daily.   Contour Next Test test strip Generic drug: glucose blood Use as instructed to check blood sugar TID.   cyanocobalamin 1000 MCG tablet Take by mouth.   Dexcom G6 Sensor Misc 1 Device by Does not apply route as directed.   Dexcom G6 Transmitter Misc 1 Device by Does not apply route as directed.   fluticasone 50 MCG/ACT nasal spray Commonly known as: FLONASE Place 2 sprays into both nostrils daily.   gabapentin 300 MG capsule Commonly known as: NEURONTIN Take 1 capsule (300 mg total) by mouth 2 (two) times daily.   nortriptyline 10 MG capsule Commonly known as: PAMELOR Take 1 capsule (10 mg total) by mouth at bedtime.   NovoLOG 100 UNIT/ML injection Generic drug: insulin aspart Inject 50 units max daily dose. Use with pump   Omnipod 5 G6 Pods (Gen 5) Misc Use as directed and change pod every 3 days   ondansetron 4 MG tablet Commonly known as: ZOFRAN Take 1 tablet (4 mg total) by mouth every 8 (eight) hours as needed for Nausea / Vomiting.   predniSONE 5 MG tablet Commonly known as: DELTASONE Take 5 mg by mouth daily with breakfast.   rizatriptan 10 MG disintegrating tablet Commonly known as: MAXALT-MLT Take 1 tablet (10 mg total) by mouth as needed for migraine. May repeat in 2 hours if needed.  Maximum 2 tablets in 24 hours.   sulfamethoxazole-trimethoprim 400-80 MG tablet Commonly known as: BACTRIM Take by mouth.   tacrolimus 1 MG capsule Commonly known as: PROGRAF Take 4 capsules in the morning and 3 capsules at night         OBJECTIVE:   Vital Signs: BP 138/84 (BP Location: Left Arm, Patient Position: Sitting, Cuff Size: Large)   Pulse 80   Ht 5\' 1"  (1.549 m)   Wt 191 lb (86.6 kg)   SpO2 96%   BMI 36.09 kg/m   Wt Readings from Last 3 Encounters:  03/13/23 191 lb (86.6 kg)  12/20/22 190 lb 9.6 oz (86.5 kg)  08/02/22 195 lb 3.2 oz (88.5 kg)     Exam: General: Pt appears well and is in NAD  Lungs: Clear with good BS bilat  Heart:  RRR   Extremities: No pretibial edema.  Left Foot Boot   Neuro: MS is good with appropriate affect, pt is  alert and Ox3   DM foot exam: per podiatry 12/11/2022            DATA REVIEWED:  Lab Results  Component Value Date   HGBA1C 11.2 (A) 03/13/2023   HGBA1C 8.1 (A) 07/09/2022   HGBA1C 6.8 (A) 01/03/2022    Latest Reference Range & Units 03/13/23 15:41  Total CHOL/HDL Ratio  3  Cholesterol 0 - 200 mg/dL 161  HDL Cholesterol >09.60 mg/dL 45.40  LDL (calc) 0 - 99 mg/dL 64  NonHDL  981.19  Triglycerides 0.0 - 149.0 mg/dL 147.8 (H)  VLDL 0.0 - 29.5 mg/dL 62.1  (H): Data is abnormally high   12/17/2022 through [facility] Gluc 448 BUN 35 Cr 1.96 GFR 32 A1c 10.2%   ASSESSMENT / PLAN / RECOMMENDATIONS:   1) Type 1 Diabetes Mellitus, Poorly controlled, With neuropathic, retinopathic , CKD III complications and S/P renal transplant   - Most recent A1c of 11.2  %. Goal A1c < 7.0 %.    -Unfortunately she continues with hyperglycemia, despite having the pump, the patient has not been using it appropriately -She has been without the Dexcom, patient advised that if she does not have the Dexcom then she will need to check glucose before each meal and entered into the pump that when she gets a bolus and a correction -She has not been consistently entering carbohydrates either, we again discussed the importance of entering carbohydrates each time she eats into the pump -I will adjust her insulin to carb ratio -She was given Dexcom G6 sensor sample and refill sent to the pharmacy  MEDICATIONS:    Pump   Omnipod Settings   Insulin type   Novolog   Basal rate       0000 1.20 u/h               I:C ratio       0000 1:10   1700 1:10              Sensitivity       0000  35      Goal       0000  120           EDUCATION / INSTRUCTIONS: BG monitoring instructions: Patient is instructed to check her blood sugars 3 times a day, before meals  Call [facility] clinic if: BG persistently < 70  I reviewed the Rule of 15 for the treatment of hypoglycemia in detail with the patient. Literature supplied.    2) Diabetic complications:  Eye: Does  have known diabetic retinopathy.  Neuro/ Feet: Does have known diabetic peripheral neuropathy .  Renal: Patient does  have known baseline CKD, she is status post renal transplant. She   is not on an ACEI/ARB at present.    3) Dyslipidemia:   -LDL is within goal -Triglyceride remains above goal but it is trending down -No changes at this time    Medication Continue atorvastatin 20 mg daily   F/U in 6 months   Signed electronically by: Lyndle Herrlich, MD  [facility] Endocrinology  [facility] [address] [place] [place], [place] [address] Phone: [phone] FAX: [phone]   CC: Marcine Matar, MD [address] [place] [address] [place] [address] Phone: [phone]  Fax: [phone]  Return to Endocrinology clinic as below: Future Appointments  Date Time Provider Department Center  04/23/2023  8:30 AM Drema Dallas, DO [id number] None

## 2023-03-14 ENCOUNTER — Other Ambulatory Visit: Payer: Self-pay

## 2023-03-14 LAB — LIPID PANEL
Cholesterol: 158 mg/dL (ref 0–200)
HDL: 53.7 mg/dL (ref >39.00)
LDL Cholesterol: 64 mg/dL (ref 0–99)
NonHDL: 103.81
Total CHOL/HDL Ratio: 3
Triglycerides: 197 mg/dL — ABNORMAL HIGH (ref 0.0–149.0)
VLDL: 39.4 mg/dL (ref 0.0–40.0)

## 2023-03-15 ENCOUNTER — Other Ambulatory Visit: Payer: Self-pay

## 2023-03-15 MED ORDER — DEXCOM G6 TRANSMITTER MISC
1.0000 | 3 refills | Status: DC
Start: 2023-03-15 — End: 2023-11-25
  Filled 2023-03-15: qty 1, 30d supply, fill #0
  Filled 2023-04-15: qty 1, 30d supply, fill #1
  Filled 2023-05-16: qty 1, 30d supply, fill #2
  Filled 2023-06-20 – 2023-09-16 (×2): qty 1, 90d supply, fill #3

## 2023-03-15 MED ORDER — OMNIPOD 5 DEXG7G6 PODS GEN 5 MISC
1.0000 | 3 refills | Status: DC
  Filled 2023-03-15 – 2023-03-18 (×2): qty 10, 30d supply, fill #0
  Filled 2023-05-10: qty 10, 30d supply, fill #1
  Filled 2023-06-03: qty 10, 30d supply, fill #2
  Filled 2023-07-04: qty 10, 30d supply, fill #3
  Filled 2023-08-07 (×3): qty 10, 30d supply, fill #4
  Filled 2023-09-01: qty 10, 30d supply, fill #5

## 2023-03-15 MED ORDER — INSULIN ASPART 100 UNIT/ML IJ SOLN
INTRAMUSCULAR | 3 refills | Status: DC
  Filled 2023-03-15: qty 50, fill #0
  Filled 2023-03-27: qty 10, 20d supply, fill #0
  Filled 2023-04-12 – 2023-04-15 (×2): qty 10, 20d supply, fill #1
  Filled 2023-04-27 – 2023-05-01 (×2): qty 10, 20d supply, fill #2
  Filled 2023-05-16 – 2023-05-17 (×2): qty 10, 20d supply, fill #3
  Filled 2023-05-29 – 2023-06-04 (×2): qty 10, 20d supply, fill #4
  Filled 2023-06-22: qty 10, 20d supply, fill #5

## 2023-03-15 MED ORDER — DEXCOM G6 SENSOR MISC
1.0000 | 3 refills | Status: DC
Start: 2023-03-15 — End: 2023-11-25
  Filled 2023-03-15: qty 3, 30d supply, fill #0
  Filled 2023-04-15: qty 3, 30d supply, fill #1
  Filled 2023-05-16: qty 3, 30d supply, fill #2
  Filled 2023-06-20: qty 3, 30d supply, fill #3
  Filled 2023-09-17: qty 3, 30d supply, fill #4
  Filled 2023-10-31: qty 3, 30d supply, fill #5

## 2023-03-15 MED ORDER — ATORVASTATIN CALCIUM 20 MG PO TABS
20.0000 mg | ORAL_TABLET | Freq: Every day | ORAL | 3 refills | Status: DC
Start: 2023-03-15 — End: 2023-11-25
  Filled 2023-03-15: qty 90, 90d supply, fill #0
  Filled 2023-03-18: qty 30, 30d supply, fill #0
  Filled 2023-04-27: qty 30, 30d supply, fill #1
  Filled 2023-05-26: qty 30, 30d supply, fill #2
  Filled 2023-06-23: qty 30, 30d supply, fill #3
  Filled 2023-07-21: qty 30, 30d supply, fill #4
  Filled 2023-09-01: qty 30, 30d supply, fill #5
  Filled 2023-10-01: qty 30, 30d supply, fill #6
  Filled 2023-10-31: qty 30, 30d supply, fill #7
  Filled 2023-11-24: qty 30, 30d supply, fill #8

## 2023-03-18 ENCOUNTER — Other Ambulatory Visit: Payer: Self-pay

## 2023-03-20 ENCOUNTER — Other Ambulatory Visit: Payer: Self-pay

## 2023-03-28 ENCOUNTER — Other Ambulatory Visit: Payer: Self-pay

## 2023-03-29 ENCOUNTER — Other Ambulatory Visit: Payer: Self-pay

## 2023-04-02 DIAGNOSIS — E103413 Type 1 diabetes mellitus with severe nonproliferative diabetic retinopathy with macular edema, bilateral: Secondary | ICD-10-CM | POA: Diagnosis not present

## 2023-04-05 ENCOUNTER — Ambulatory Visit (INDEPENDENT_AMBULATORY_CARE_PROVIDER_SITE_OTHER): Payer: 59 | Admitting: Podiatry

## 2023-04-05 DIAGNOSIS — M21961 Unspecified acquired deformity of right lower leg: Secondary | ICD-10-CM

## 2023-04-05 DIAGNOSIS — M21962 Unspecified acquired deformity of left lower leg: Secondary | ICD-10-CM

## 2023-04-05 NOTE — Progress Notes (Signed)
Patient presents today to pick up orthotics.  Patient was dispensed 1 pair pf orthotics. Fit was satisfactory. Instructions for break-in and wear was reviewed and a copy was given to the patient.  Re-appointment if they should experience ant trouble with insoles.

## 2023-04-11 DIAGNOSIS — I10 Essential (primary) hypertension: Secondary | ICD-10-CM | POA: Diagnosis not present

## 2023-04-11 DIAGNOSIS — N189 Chronic kidney disease, unspecified: Secondary | ICD-10-CM | POA: Diagnosis not present

## 2023-04-11 DIAGNOSIS — Z79899 Other long term (current) drug therapy: Secondary | ICD-10-CM | POA: Diagnosis not present

## 2023-04-11 DIAGNOSIS — B259 Cytomegaloviral disease, unspecified: Secondary | ICD-10-CM | POA: Diagnosis not present

## 2023-04-11 DIAGNOSIS — D849 Immunodeficiency, unspecified: Secondary | ICD-10-CM | POA: Diagnosis not present

## 2023-04-11 DIAGNOSIS — Z4822 Encounter for aftercare following kidney transplant: Secondary | ICD-10-CM | POA: Diagnosis not present

## 2023-04-11 DIAGNOSIS — Z940 Kidney transplant status: Secondary | ICD-10-CM | POA: Diagnosis not present

## 2023-04-11 DIAGNOSIS — E11311 Type 2 diabetes mellitus with unspecified diabetic retinopathy with macular edema: Secondary | ICD-10-CM | POA: Diagnosis not present

## 2023-04-11 DIAGNOSIS — E119 Type 2 diabetes mellitus without complications: Secondary | ICD-10-CM | POA: Diagnosis not present

## 2023-04-11 DIAGNOSIS — Z79621 Long term (current) use of calcineurin inhibitor: Secondary | ICD-10-CM | POA: Diagnosis not present

## 2023-04-11 DIAGNOSIS — H2513 Age-related nuclear cataract, bilateral: Secondary | ICD-10-CM | POA: Diagnosis not present

## 2023-04-12 ENCOUNTER — Other Ambulatory Visit: Payer: Self-pay

## 2023-04-15 ENCOUNTER — Other Ambulatory Visit: Payer: Self-pay

## 2023-04-22 NOTE — Progress Notes (Unsigned)
NEUROLOGY FOLLOW UP OFFICE NOTE  Cathy Green [id number]  Assessment/Plan:   Migraine without aura, without status migrainosus, intractable   Migraine prevention:  start nortriptyline 10mg  at bedtime.  We can increase to 25mg  at bedtime in 4 weeks if needed. Migraine rescue:  rizatriptan 10mg ; Zofran for nausea Limit use of pain relievers to no more than 2 days out of week to prevent risk of rebound or medication-overuse headache. Keep headache diary Follow up 4 to 5 months.       Subjective:  Cathy Green is a 42 year old female with HTN, HLD, DM I, CKD s/p kidney transplant, and glaucoma who follows up for migraines.  UPDATE: Started nortriptyline ***  Intensity:  *** Duration:  *** Frequency:  *** Frequency of abortive medication: *** Current NSAIDS/analgesics:  none Current triptans:  rizatriptan 10mg  *** Current ergotamine:  none Current anti-emetic:  ondansetron 4mg  Current muscle relaxants:  none Current Antihypertensive medications:  amlodipine 5mg  daily Current Antidepressant medications:  gabapentin 300mg  BID (neuralgia) Current Anticonvulsant medications:  nortriptyline 10mg  at bedtime *** Current anti-CGRP:  none Current Vitamins/Herbal/Supplements:  none Current Antihistamines/Decongestants:  Flonase Other therapy:  none Birth control:  no Other medication:  prednisone 5mg  daily    Caffeine:  1 cup coffee 3 times a week.   Diet:  at least 64 oz water.  Decaff soda.  Skips meals Exercise:  no Depression:  no; Anxiety:  no Other pain:  no Sleep hygiene:  good.  At least 6 hours sleep a night.  HISTORY:  Onset:  42 years old.  Worse over past 2 years. Location:  may start over right eye but progresses to holocephalic Quality:  pressure, throbbing, stabbing (right eye) Intensity:  10/10.   Aura:  absent Prodrome:  absent Associated symptoms:  Blurred vision, nausea, photophobia, phonophobia.  She denies associated   vomiting, osmophobia,  unilateral numbness or weakness. Duration:  2 days on average Frequency:  1 to 2 times a week (total 5-6 days a week) Frequency of abortive medication: 0 Triggers:  menstrual cycle, weather Relieving factors:  cold mask, dark room, rest Activity:  aggravate  Eye exam reportedly unremarkable.   Past NSAIDS/analgesics:  acetaminophen, ASA, ibuprofen - CANNOT TAKE NSAIDS DUE TO KIDNEY DISEASE Past abortive triptans:  unsure Past abortive ergotamine:  none Past muscle relaxants:  methocarbamol Past anti-emetic:  promethazine Past antihypertensive medications:  carvedilol, lisinopril Past antidepressant medications:  none Past anticonvulsant medications:  none Past anti-CGRP:  none Past vitamins/Herbal/Supplements:  none Past antihistamines/decongestants:  none Other past therapies:  none    Family history of headache:  no  PAST MEDICAL HISTORY: Past Medical History:  Diagnosis Date   Abnormal Pap smear    Allergy    seasonal allergies   Anemia    Chronic kidney disease    Diabetes mellitus    diagnosed age 42   Diabetes mellitus without complication (HCC)    Glaucoma    Hyperlipidemia    Hypertension    Migraines    Noncompliance    Numbness in left leg     MEDICATIONS: Current Outpatient Medications on File Prior to Visit  Medication Sig Dispense Refill   amLODipine (NORVASC) 5 MG tablet Take 1 tablet (5 mg total) by mouth daily. 30 tablet 6   atorvastatin (LIPITOR) 20 MG tablet Take 1 tablet (20 mg total) by mouth daily. 90 tablet 3   Continuous Glucose Sensor (DEXCOM G6 SENSOR) MISC Apply every 10 days. 9 each 3   Continuous  Glucose Transmitter (DEXCOM G6 TRANSMITTER) MISC Use as directed. 1 each 3   cyanocobalamin 1000 MCG tablet Take by mouth.     fluticasone (FLONASE) 50 MCG/ACT nasal spray Place 2 sprays into both nostrils daily. 16 g 6   gabapentin (NEURONTIN) 300 MG capsule Take 1 capsule (300 mg total) by mouth 2 (two) times daily. 180 capsule 1   glucose  blood (CONTOUR NEXT TEST) test strip Use as instructed to check blood sugar TID. 100 each 6   insulin aspart (NOVOLOG) 100 UNIT/ML injection Inject 50 units max daily dose. Use with pump 50 mL 3   Insulin Disposable Pump (OMNIPOD 5 G6 PODS, GEN 5,) MISC Use as directed and change pod every 3 days 15 each 3   nortriptyline (PAMELOR) 10 MG capsule Take 1 capsule (10 mg total) by mouth at bedtime. 30 capsule 5   ondansetron (ZOFRAN) 4 MG tablet Take 1 tablet (4 mg total) by mouth every 8 (eight) hours as needed for Nausea / Vomiting. 20 tablet 0   predniSONE (DELTASONE) 5 MG tablet Take 5 mg by mouth daily with breakfast.     rizatriptan (MAXALT-MLT) 10 MG disintegrating tablet Take 1 tablet (10 mg total) by mouth as needed for migraine. May repeat in 2 hours if needed.  Maximum 2 tablets in 24 hours. 9 tablet 5   sulfamethoxazole-trimethoprim (BACTRIM) 400-80 MG tablet Take by mouth.     tacrolimus (PROGRAF) 1 MG capsule Take 4 capsules in the morning and 3 capsules at night     No current facility-administered medications on file prior to visit.    ALLERGIES: Allergies  Allergen Reactions   Garlic Nausea And Vomiting    FAMILY HISTORY: Family History  Problem Relation Age of Onset   Hypertension Mother    Stroke Mother    Hypertension Father    Cancer Maternal Grandfather        prostate   Breast cancer Cousin        maternal 1st cousin   Cancer Other    COPD Other    Hyperlipidemia Other       Objective:  *** General: No acute distress.  Patient appears ***-groomed.   Head:  Normocephalic/atraumatic Eyes:  Fundi examined but not visualized Neck: supple, no paraspinal tenderness, full range of motion Heart:  Regular rate and rhythm Lungs:  Clear to auscultation bilaterally Back: No paraspinal tenderness Neurological Exam: alert and oriented.  Speech fluent and not dysarthric, language intact.  CN II-XII intact. Bulk and tone normal, muscle strength 5/5 throughout.   Sensation to light touch intact.  Deep tendon reflexes 2+ throughout, toes downgoing.  Finger to nose testing intact.  Gait normal, Romberg negative.   Shon Millet, DO  CC: ***

## 2023-04-23 ENCOUNTER — Other Ambulatory Visit: Payer: Self-pay

## 2023-04-23 ENCOUNTER — Encounter: Payer: Self-pay | Admitting: Neurology

## 2023-04-23 ENCOUNTER — Ambulatory Visit (INDEPENDENT_AMBULATORY_CARE_PROVIDER_SITE_OTHER): Payer: 59 | Admitting: Neurology

## 2023-04-23 VITALS — BP 136/89 | HR 99 | Ht 61.0 in | Wt 192.4 lb

## 2023-04-23 DIAGNOSIS — G43009 Migraine without aura, not intractable, without status migrainosus: Secondary | ICD-10-CM | POA: Diagnosis not present

## 2023-04-23 MED ORDER — RIZATRIPTAN BENZOATE 10 MG PO TBDP
10.0000 mg | ORAL_TABLET | ORAL | 5 refills | Status: DC | PRN
  Filled 2023-04-23 – 2023-07-21 (×2): qty 9, 30d supply, fill #0
  Filled 2023-10-15: qty 9, 30d supply, fill #1

## 2023-04-23 MED ORDER — ONDANSETRON HCL 4 MG PO TABS
4.0000 mg | ORAL_TABLET | Freq: Three times a day (TID) | ORAL | 5 refills | Status: DC | PRN
  Filled 2023-04-23: qty 18, 6d supply, fill #0
  Filled 2023-10-15: qty 20, 7d supply, fill #0

## 2023-04-23 MED ORDER — NORTRIPTYLINE HCL 10 MG PO CAPS
10.0000 mg | ORAL_CAPSULE | Freq: Every day | ORAL | 5 refills | Status: DC
  Filled 2023-04-23 – 2023-05-17 (×2): qty 30, 30d supply, fill #0
  Filled 2023-06-20: qty 30, 30d supply, fill #1
  Filled 2023-07-21: qty 30, 30d supply, fill #2
  Filled 2023-08-18: qty 30, 30d supply, fill #3
  Filled 2023-09-16 – 2023-09-17 (×2): qty 30, 30d supply, fill #4
  Filled 2023-10-15: qty 30, 30d supply, fill #5

## 2023-04-23 NOTE — Patient Instructions (Signed)
Continue nortriptyline 10mg  at bedtime Rizatriptan as needed.  Limit use of pain relievers to no more than 2 days out of week to prevent risk of rebound or medication-overuse headache. Keep headache diary

## 2023-04-26 DIAGNOSIS — E103413 Type 1 diabetes mellitus with severe nonproliferative diabetic retinopathy with macular edema, bilateral: Secondary | ICD-10-CM | POA: Diagnosis not present

## 2023-04-29 ENCOUNTER — Other Ambulatory Visit: Payer: Self-pay

## 2023-04-29 DIAGNOSIS — H40013 Open angle with borderline findings, low risk, bilateral: Secondary | ICD-10-CM | POA: Diagnosis not present

## 2023-04-29 DIAGNOSIS — H25813 Combined forms of age-related cataract, bilateral: Secondary | ICD-10-CM | POA: Diagnosis not present

## 2023-04-29 LAB — HM DIABETES EYE EXAM

## 2023-04-29 MED ORDER — SIMBRINZA 1-0.2 % OP SUSP
1.0000 [drp] | Freq: Two times a day (BID) | OPHTHALMIC | 11 refills | Status: AC
Start: 2023-04-29 — End: ?
  Filled 2023-04-29: qty 8, 30d supply, fill #0
  Filled 2023-05-26: qty 8, 30d supply, fill #1
  Filled 2023-07-04: qty 8, 30d supply, fill #2
  Filled 2023-08-07 – 2023-08-18 (×2): qty 8, 30d supply, fill #3
  Filled 2023-09-17: qty 8, 30d supply, fill #4
  Filled 2023-11-09: qty 8, 30d supply, fill #5
  Filled 2023-12-12: qty 8, 30d supply, fill #6
  Filled 2024-01-26: qty 8, 30d supply, fill #7

## 2023-04-30 ENCOUNTER — Other Ambulatory Visit: Payer: Self-pay

## 2023-05-10 ENCOUNTER — Other Ambulatory Visit: Payer: Self-pay

## 2023-05-16 ENCOUNTER — Other Ambulatory Visit: Payer: Self-pay

## 2023-05-17 ENCOUNTER — Other Ambulatory Visit: Payer: Self-pay

## 2023-05-28 ENCOUNTER — Other Ambulatory Visit: Payer: Self-pay

## 2023-05-30 ENCOUNTER — Other Ambulatory Visit: Payer: Self-pay

## 2023-06-04 ENCOUNTER — Other Ambulatory Visit: Payer: Self-pay

## 2023-06-05 ENCOUNTER — Other Ambulatory Visit: Payer: Self-pay

## 2023-06-20 ENCOUNTER — Other Ambulatory Visit: Payer: Self-pay

## 2023-06-20 ENCOUNTER — Other Ambulatory Visit: Payer: Self-pay | Admitting: Internal Medicine

## 2023-06-20 DIAGNOSIS — E1042 Type 1 diabetes mellitus with diabetic polyneuropathy: Secondary | ICD-10-CM

## 2023-06-20 MED ORDER — GABAPENTIN 300 MG PO CAPS
300.0000 mg | ORAL_CAPSULE | Freq: Two times a day (BID) | ORAL | 0 refills | Status: DC
Start: 2023-06-20 — End: 2023-08-07
  Filled 2023-06-20: qty 180, 90d supply, fill #0

## 2023-06-21 ENCOUNTER — Other Ambulatory Visit: Payer: Self-pay

## 2023-06-24 ENCOUNTER — Other Ambulatory Visit: Payer: Self-pay

## 2023-06-24 ENCOUNTER — Encounter: Payer: Self-pay | Admitting: Internal Medicine

## 2023-06-25 ENCOUNTER — Other Ambulatory Visit: Payer: Self-pay

## 2023-06-25 MED ORDER — INSULIN ASPART 100 UNIT/ML IJ SOLN
INTRAMUSCULAR | 11 refills | Status: DC
  Filled 2023-06-25: qty 20, 29d supply, fill #0
  Filled 2023-07-21: qty 20, 29d supply, fill #1
  Filled 2023-08-18: qty 20, 29d supply, fill #2
  Filled 2023-09-16: qty 20, 29d supply, fill #3
  Filled 2023-10-15: qty 20, 29d supply, fill #4
  Filled 2023-11-24: qty 20, 29d supply, fill #5

## 2023-06-26 ENCOUNTER — Other Ambulatory Visit: Payer: Self-pay

## 2023-06-28 ENCOUNTER — Encounter: Payer: Self-pay | Admitting: Internal Medicine

## 2023-06-28 ENCOUNTER — Ambulatory Visit (INDEPENDENT_AMBULATORY_CARE_PROVIDER_SITE_OTHER): Payer: Medicaid Other | Admitting: Internal Medicine

## 2023-06-28 ENCOUNTER — Other Ambulatory Visit: Payer: Self-pay

## 2023-06-28 VITALS — BP 130/76 | HR 84 | Ht 61.0 in | Wt 192.6 lb

## 2023-06-28 DIAGNOSIS — E1022 Type 1 diabetes mellitus with diabetic chronic kidney disease: Secondary | ICD-10-CM

## 2023-06-28 DIAGNOSIS — E1042 Type 1 diabetes mellitus with diabetic polyneuropathy: Secondary | ICD-10-CM

## 2023-06-28 DIAGNOSIS — E1065 Type 1 diabetes mellitus with hyperglycemia: Secondary | ICD-10-CM

## 2023-06-28 DIAGNOSIS — E785 Hyperlipidemia, unspecified: Secondary | ICD-10-CM

## 2023-06-28 DIAGNOSIS — Z940 Kidney transplant status: Secondary | ICD-10-CM

## 2023-06-28 DIAGNOSIS — N1832 Chronic kidney disease, stage 3b: Secondary | ICD-10-CM

## 2023-06-28 LAB — POCT GLYCOSYLATED HEMOGLOBIN (HGB A1C): Hemoglobin A1C: 8.2 % — AB (ref 4.0–5.6)

## 2023-06-28 NOTE — Progress Notes (Signed)
Name: Cathy Green  Age/ Sex: 42 y.o., female   MRN/ DOB: [id number], [birth year]     PCP: Marcine Matar, MD   Reason for Endocrinology Evaluation: Type 1 Diabetes Mellitus  Initial Endocrine Consultative Visit: 11/14/2020    PATIENT IDENTIFIER: Cathy Green is a 42 y.o. female with a past medical history of T1DM , HTN and Dyslipidemia and hx of renal transplant. The patient has followed with Endocrinology clinic since 11/14/2020 for consultative assistance with management of her diabetes.  DIABETIC HISTORY:  Cathy Green was diagnosed with DM at age 43, Metformin caused GI side effects .Marland Kitchen Her hemoglobin A1c has ranged from 6.2% in 01/2020, peaking at 8.1% in 08/2020.  S/P renal transplant 06/2020- Through [facility]  No hx of dialysis    On her initial visit to our clinic she had an A1c of 6.5 %, we adjusted MDI regimen and provided her with correction scale  She was started on OmniPod in July 2023   SUBJECTIVE:   During the last visit (03/13/2023): A1c 11.2%     Today (06/28/2023): Cathy Green is here for a follow up on diabetes.  She has not been using the dexcom because she thre    She continues to follow-up with kidney transplant team She follows with podiatry for left foot stress fracture 04/05/2023 Denies nausea or vomiting  Denies constipation or diarrhea    This patient with type 1 diabetes is treated with Omnipod (insulin pump). During the visit the pump basal and bolus doses were reviewed including carb/insulin rations and supplemental doses. The clinical list was updated. The glucose meter download was reviewed in detail to determine if the current pump settings are providing the best glycemic control without excessive hypoglycemia.  Pump and meter download:    Pump   Omnipod Settings   Insulin type   Novolog   Basal rate       0000 1.20 u/h               I:C ratio       0000 1:10   1700 1:10              Sensitivity       0000  35      Goal        0000  120             Type & Model of Pump: Omnipod Insulin Type: Currently using Novolog .  PUMP STATISTICS: Average BG: 196  Average Daily Carbs (g): 151.6 Average Total Daily Insulin: 59.8 Average Daily Basal: 43.1 (72 %) Average Daily Bolus: 28 (16.7 %)    HOME DIABETES REGIMEN:  Novolog      Statin: yes ACE-I/ARB: no Prior Diabetic Education: yes    CONTINUOUS GLUCOSE MONITORING RECORD INTERPRETATION    Dates of Recording: 9/14 - 06/28/2023  Sensor description: Dexcom  Results statistics:   CGM use % of time 88.5  Average and SD 196/80  Time in range    51    %  % Time Above 180 23  % Time above 250 26  % Time Below target 0   Glycemic patterns summary: BGs start high overnight and trend down, and fluctuate again during the day  Hyperglycemic episodes postprandial  Hypoglycemic episodes occurred where  Overnight periods: Start high and trends down to optimal      DIABETIC COMPLICATIONS: Microvascular complications:  Neuropathy, CKD (S/P transplant 2021), retinopathy ( S/P injections) Last eye exam: Completed 05/22/2022  Macrovascular complications:   Denies: CAD, CVA, PVD   HISTORY:  Past Medical History:  Past Medical History:  Diagnosis Date   Abnormal Pap smear    Allergy    seasonal allergies   Anemia    Chronic kidney disease    Diabetes mellitus    diagnosed age 33   Diabetes mellitus without complication (HCC)    Glaucoma    Hyperlipidemia    Hypertension    Migraines    Noncompliance    Numbness in left leg    Past Surgical History:  Past Surgical History:  Procedure Laterality Date   KIDNEY TRANSPLANT  06/2020   WISDOM TOOTH EXTRACTION     Social History:  reports that she has never smoked. She has never used smokeless tobacco. She reports current alcohol use. She reports that she does not use drugs. Family History:  Family History  Problem Relation Age of Onset   Hypertension Mother    Stroke  Mother    Hypertension Father    Cancer Maternal Grandfather        prostate   Breast cancer Cousin        maternal 1st cousin   Cancer Other    COPD Other    Hyperlipidemia Other      HOME MEDICATIONS: Allergies as of 06/28/2023       Reactions   Garlic Nausea And Vomiting        Medication List        Accurate as of June 28, 2023 11:30 AM. If you have any questions, ask your nurse or doctor.          amLODipine 5 MG tablet Commonly known as: NORVASC Take 1 tablet (5 mg total) by mouth daily.   amLODipine 5 MG tablet Commonly known as: NORVASC Take 1 tablet by mouth daily.   atorvastatin 20 MG tablet Commonly known as: LIPITOR Take 1 tablet (20 mg total) by mouth daily.   TRUE METRIX BLOOD GLUCOSE TEST VI   Contour Next Test test strip Generic drug: glucose blood Use as instructed to check blood sugar TID.   cyanocobalamin 1000 MCG tablet Take by mouth.   Dexcom G6 Sensor Misc Apply every 10 days.   Dexcom G6 Transmitter Misc Use as directed.   fluticasone 50 MCG/ACT nasal spray Commonly known as: FLONASE Place 2 sprays into both nostrils daily.   gabapentin 300 MG capsule Commonly known as: NEURONTIN Take 1 capsule (300 mg total) by mouth 2 (two) times daily. Needs to be seen prior to next refill request.   nortriptyline 10 MG capsule Commonly known as: PAMELOR Take 1 capsule (10 mg total) by mouth at bedtime.   NovoLOG 100 UNIT/ML injection Generic drug: insulin aspart Inject 70 units max daily dose. Use with pump   Omnipod 5 G6 Pods (Gen 5) Misc Use as directed and change pod every 3 days   ondansetron 4 MG tablet Commonly known as: ZOFRAN Take 1 tablet (4 mg total) by mouth every 8 (eight) hours as needed for Nausea / Vomiting.   pantoprazole 40 MG tablet Commonly known as: PROTONIX Take 40 mg by mouth daily.   predniSONE 5 MG tablet Commonly known as: DELTASONE Take 5 mg by mouth daily with breakfast.   rizatriptan 10  MG disintegrating tablet Commonly known as: MAXALT-MLT Take 1 tablet (10 mg total) by mouth as needed for migraine. May repeat in 2 hours if needed.  Maximum 2 tablets in 24 hours.   Simbrinza 1-0.2 %  Susp Generic drug: Brinzolamide-Brimonidine Instill 1 drop into both eyes twice a day   sulfamethoxazole-trimethoprim 400-80 MG tablet Commonly known as: BACTRIM Take by mouth.   sulfamethoxazole-trimethoprim 400-80 MG tablet Commonly known as: BACTRIM Take by mouth.   tacrolimus 1 MG capsule Commonly known as: PROGRAF Take 1 mg by mouth 2 (two) times daily. 2 cap in the morning, and 2 cap at night   tacrolimus 1 MG capsule Commonly known as: PROGRAF Take by mouth.         OBJECTIVE:   Vital Signs: BP 130/76 (BP Location: Left Arm, Patient Position: Sitting, Cuff Size: Large)   Pulse 84   Ht 5\' 1"  (1.549 m)   Wt 192 lb 9.6 oz (87.4 kg)   SpO2 95%   BMI 36.39 kg/m   Wt Readings from Last 3 Encounters:  06/28/23 192 lb 9.6 oz (87.4 kg)  04/23/23 192 lb 6.4 oz (87.3 kg)  03/13/23 191 lb (86.6 kg)     Exam: General: Pt appears well and is in NAD  Lungs: Clear with good BS bilat  Heart: RRR   Extremities: No pretibial edema.   Neuro: MS is good with appropriate affect, pt is alert and Ox3   DM foot exam: 06/28/2023  The skin of the feet is intact without sores or ulcerations. The pedal pulses are 2+ on right and 2+ on left. The sensation is intact to a screening 5.07, 10 gram monofilament bilaterally           DATA REVIEWED:  Lab Results  Component Value Date   HGBA1C 11.2 (A) 03/13/2023   HGBA1C 8.1 (A) 07/09/2022   HGBA1C 6.8 (A) 01/03/2022   Labs on 04/11/2023 through [facility] Gluc 137 BUN 15 Cr 1.59 GFR 42 A1c 10.2%    03/13/2023 HDL 53.7 LDL 64 Triglycerides 197  ASSESSMENT / PLAN / RECOMMENDATIONS:   1) Type 1 Diabetes Mellitus, Poorly controlled, With neuropathic, retinopathic , CKD III complications and S/P renal transplant    - Most recent A1c of 8.2  %. Goal A1c < 7.0 %.    -A1c down from 11.2% to 8.2% -She has been consistently entering her carbohydrates, I have praised the patient on this and encouraged her to continue -I will adjust her insulin to carb ratio at suppertime -I will also increase her basal rate starting at 4 PM   MEDICATIONS:    Pump   Omnipod Settings   Insulin type   Novolog   Basal rate       0000 1.20 u/h    1600 1.30          I:C ratio       0000 1:10   1600 1:8              Sensitivity       0000  35      Goal       0000  120           EDUCATION / INSTRUCTIONS: BG monitoring instructions: Patient is instructed to check her blood sugars 3 times a day, before meals  Call [facility] if: BG persistently < 70  I reviewed the Rule of 15 for the treatment of hypoglycemia in detail with the patient. Literature supplied.    2) Diabetic complications:  Eye: Does  have known diabetic retinopathy.  Neuro/ Feet: Does have known diabetic peripheral neuropathy .  Renal: Patient does  have known baseline CKD, she is status post renal transplant.  She   is not on an ACEI/ARB at present.    3) Dyslipidemia:   -LDL remains at goal, TG slightly elevated -No changes   Medication Continue atorvastatin 20 mg daily   F/U in 5 months   Signed electronically by: Lyndle Herrlich, MD  [facility]  [facility] [address] [place] [place], [place] [address] Phone: [phone] FAX: [phone]   CC: Marcine Matar, MD [address] [place] [address] Phone: [phone]  Fax: [phone]  Return to Endocrinology clinic as below: Future Appointments  Date Time Provider Department Center  10/30/2023  2:10 PM Drema Dallas, DO [facility] None

## 2023-06-28 NOTE — Patient Instructions (Signed)

## 2023-07-05 ENCOUNTER — Other Ambulatory Visit: Payer: Self-pay

## 2023-07-08 ENCOUNTER — Ambulatory Visit: Payer: Medicaid Other | Admitting: Internal Medicine

## 2023-07-10 ENCOUNTER — Other Ambulatory Visit: Payer: Self-pay

## 2023-07-12 DIAGNOSIS — Z79899 Other long term (current) drug therapy: Secondary | ICD-10-CM | POA: Diagnosis not present

## 2023-07-12 DIAGNOSIS — Z4822 Encounter for aftercare following kidney transplant: Secondary | ICD-10-CM | POA: Diagnosis not present

## 2023-07-12 DIAGNOSIS — R1031 Right lower quadrant pain: Secondary | ICD-10-CM | POA: Diagnosis not present

## 2023-07-12 DIAGNOSIS — Z949 Transplanted organ and tissue status, unspecified: Secondary | ICD-10-CM | POA: Diagnosis not present

## 2023-07-12 DIAGNOSIS — D849 Immunodeficiency, unspecified: Secondary | ICD-10-CM | POA: Diagnosis not present

## 2023-07-12 DIAGNOSIS — Z79621 Long term (current) use of calcineurin inhibitor: Secondary | ICD-10-CM | POA: Diagnosis not present

## 2023-07-12 DIAGNOSIS — Z5181 Encounter for therapeutic drug level monitoring: Secondary | ICD-10-CM | POA: Diagnosis not present

## 2023-07-12 DIAGNOSIS — Z940 Kidney transplant status: Secondary | ICD-10-CM | POA: Diagnosis not present

## 2023-07-12 DIAGNOSIS — D84821 Immunodeficiency due to drugs: Secondary | ICD-10-CM | POA: Diagnosis not present

## 2023-07-17 DIAGNOSIS — R1031 Right lower quadrant pain: Secondary | ICD-10-CM | POA: Diagnosis not present

## 2023-07-17 DIAGNOSIS — Z940 Kidney transplant status: Secondary | ICD-10-CM | POA: Diagnosis not present

## 2023-07-22 ENCOUNTER — Other Ambulatory Visit: Payer: Self-pay

## 2023-07-23 ENCOUNTER — Other Ambulatory Visit: Payer: Self-pay

## 2023-07-24 ENCOUNTER — Other Ambulatory Visit: Payer: Self-pay

## 2023-07-30 ENCOUNTER — Other Ambulatory Visit: Payer: Self-pay

## 2023-08-01 ENCOUNTER — Other Ambulatory Visit: Payer: Self-pay

## 2023-08-06 ENCOUNTER — Telehealth: Payer: Self-pay

## 2023-08-06 ENCOUNTER — Other Ambulatory Visit (HOSPITAL_COMMUNITY): Payer: Self-pay

## 2023-08-06 NOTE — Telephone Encounter (Signed)
Pharmacy Patient Advocate Encounter   Received notification from CoverMyMeds that prior authorization for Dexcom G6 sensor is required/requested.   Insurance verification completed.   The patient is insured through [facility] .   Per test claim: PA required; PA submitted to above mentioned insurance via CoverMyMeds Key/confirmation #/EOC [id number] Status is pending

## 2023-08-07 ENCOUNTER — Other Ambulatory Visit: Payer: Self-pay

## 2023-08-07 ENCOUNTER — Encounter: Payer: Self-pay | Admitting: Physician Assistant

## 2023-08-07 ENCOUNTER — Ambulatory Visit: Payer: Medicaid Other | Attending: Physician Assistant | Admitting: Physician Assistant

## 2023-08-07 VITALS — BP 167/103 | HR 100 | Wt 196.8 lb

## 2023-08-07 DIAGNOSIS — E1165 Type 2 diabetes mellitus with hyperglycemia: Secondary | ICD-10-CM

## 2023-08-07 DIAGNOSIS — Z794 Long term (current) use of insulin: Secondary | ICD-10-CM | POA: Diagnosis not present

## 2023-08-07 DIAGNOSIS — L03031 Cellulitis of right toe: Secondary | ICD-10-CM

## 2023-08-07 DIAGNOSIS — E1042 Type 1 diabetes mellitus with diabetic polyneuropathy: Secondary | ICD-10-CM

## 2023-08-07 LAB — GLUCOSE, POCT (MANUAL RESULT ENTRY): POC Glucose: 154 mg/dL — AB (ref 70–99)

## 2023-08-07 MED ORDER — MUPIROCIN 2 % EX OINT
1.0000 | TOPICAL_OINTMENT | Freq: Two times a day (BID) | CUTANEOUS | 0 refills | Status: DC
  Filled 2023-08-07: qty 22, 11d supply, fill #0

## 2023-08-07 MED ORDER — GABAPENTIN 300 MG PO CAPS
600.0000 mg | ORAL_CAPSULE | Freq: Two times a day (BID) | ORAL | 3 refills | Status: AC
Start: 2023-08-07 — End: ?
  Filled 2023-08-07 – 2023-09-06 (×4): qty 360, 90d supply, fill #0
  Filled 2023-12-12: qty 360, 90d supply, fill #1
  Filled 2024-03-23: qty 360, 90d supply, fill #2
  Filled 2024-06-21: qty 360, 90d supply, fill #3

## 2023-08-07 NOTE — Progress Notes (Signed)
Cathy Green, is a 42 y.o. female  BMW:[id number]  UVO:[id number]  DOB - March 04, 1981  Chief Complaint  Patient presents with   Foot Pain       Subjective:   Cathy Green is a 42 y.o. female here today for worsening neuropathy of B feet over the last 4 months.  Gabapentin helps but not controlling it anymore.  Blood sugars running under 200 but mostly around 150 or less.    She felt her sock get wet in her show about 1 month ago and the area around the toenail was red.  It has improved a lot but still tender.  She has been diabetic since about age 16.  She does not have a podiatrist.    No problems updated.  ALLERGIES: Allergies  Allergen Reactions   Garlic Nausea And Vomiting    PAST MEDICAL HISTORY: Past Medical History:  Diagnosis Date   Abnormal Pap smear    Allergy    seasonal allergies   Anemia    Chronic kidney disease    Diabetes mellitus    diagnosed age 73   Diabetes mellitus without complication (HCC)    Glaucoma    Hyperlipidemia    Hypertension    Migraines    Noncompliance    Numbness in left leg     MEDICATIONS AT HOME: Prior to Admission medications   Medication Sig Start Date End Date Taking? Authorizing Provider  amLODipine (NORVASC) 5 MG tablet Take 1 tablet (5 mg total) by mouth daily. 05/27/18  Yes Marcine Matar, MD  amLODipine (NORVASC) 5 MG tablet Take 1 tablet by mouth daily. 06/10/23 06/09/24 Yes [provider]  atorvastatin (LIPITOR) 20 MG tablet Take 1 tablet (20 mg total) by mouth daily. 03/15/23  Yes Shamleffer, Konrad Dolores, MD  Brinzolamide-Brimonidine [facility]) 1-0.2 % SUSP Instill 1 drop into both eyes twice a day 04/29/23  Yes   Continuous Glucose Sensor (DEXCOM G6 SENSOR) MISC Apply every 10 days. 03/15/23  Yes Shamleffer, Konrad Dolores, MD  Continuous Glucose Transmitter (DEXCOM G6 TRANSMITTER) MISC Use as directed. 03/15/23  Yes Shamleffer, Konrad Dolores, MD  cyanocobalamin 1000 MCG tablet Take by mouth.    Yes [provider]  fluticasone (FLONASE) 50 MCG/ACT nasal spray Place 2 sprays into both nostrils daily. 02/06/21  Yes Marcine Matar, MD  glucose blood (CONTOUR NEXT TEST) test strip Use as instructed to check blood sugar TID. 07/19/20  Yes Marcine Matar, MD  Glucose Blood (TRUE METRIX BLOOD GLUCOSE TEST VI)    Yes [provider]  insulin aspart (NOVOLOG) 100 UNIT/ML injection Inject 70 units max daily dose. Use with pump 06/25/23  Yes Shamleffer, Konrad Dolores, MD  Insulin Disposable Pump (OMNIPOD 5 G6 PODS, GEN 5,) MISC Use as directed and change pod every 3 days 03/15/23  Yes Shamleffer, Konrad Dolores, MD  mupirocin ointment (BACTROBAN) 2 % Apply 1 Application topically 2 (two) times daily. 08/07/23  Yes Georgian Co M, PA-C  nortriptyline (PAMELOR) 10 MG capsule Take 1 capsule (10 mg total) by mouth at bedtime. 04/23/23  Yes Jaffe, Adam R, DO  ondansetron (ZOFRAN) 4 MG tablet Take 1 tablet (4 mg total) by mouth every 8 (eight) hours as needed for Nausea / Vomiting. 04/23/23  Yes Jaffe, Adam R, DO  pantoprazole (PROTONIX) 40 MG tablet Take 40 mg by mouth daily. 06/10/23  Yes [provider]  predniSONE (DELTASONE) 5 MG tablet Take 5 mg by mouth daily with breakfast.   Yes [provider]  rizatriptan (MAXALT-MLT) 10 MG disintegrating tablet Take 1 tablet (10 mg total) by mouth as needed for migraine. May repeat in 2 hours if needed.  Maximum 2 tablets in 24 hours. 04/23/23  Yes Drema Dallas, DO  sulfamethoxazole-trimethoprim (BACTRIM) 400-80 MG tablet Take by mouth. 12/29/21  Yes [provider]  sulfamethoxazole-trimethoprim (BACTRIM) 400-80 MG tablet Take by mouth. 06/10/23 12/25/37 Yes [provider]  tacrolimus (PROGRAF) 1 MG capsule Take 1 mg by mouth 2 (two) times daily. 2 cap in the morning, and 2 cap at night 07/26/20  Yes [provider]  tacrolimus (PROGRAF) 1 MG capsule Take by mouth. 05/23/23  Yes [provider]  gabapentin (NEURONTIN) 300 MG capsule Take 2 capsules (600 mg total) by mouth 2 (two) times daily. Needs to be seen prior to next refill request. 08/07/23   Anders Simmonds, PA-C    ROS: Neg HEENT Neg resp Neg cardiac Neg GI Neg GU Neg MS Neg psych Neg neuro  Objective:   Vitals:   08/07/23 1405  BP: (!) 167/103  Pulse: 100  SpO2: 99%  Weight: 196 lb 12.8 oz (89.3 kg)   Exam General appearance : Awake, alert, not in any distress. Speech Clear. Not toxic looking HEENT: Atraumatic and Normocephalic Neck: Supple, no JVD. No cervical lymphadenopathy.  Chest: Good air entry bilaterally, CTAB.  No rales/rhonchi/wheezing CVS: S1 S2 regular, no murmurs.  Extremities: B/L Lower Ext shows no edema, both legs are warm to touch.  B feet examined.  DP pulses =B.  Good capillary refill.  Resolving paronychia inner R great toe with mild tenderness.  No ulceration of either foot.   Neurology: Awake alert, and oriented X 3, CN II-XII intact, Non focal Skin: No Rash  Data Review Lab Results  Component Value Date   HGBA1C 8.2 (A) 06/28/2023   HGBA1C 11.2 (A) 03/13/2023   HGBA1C 8.1 (A) 07/09/2022    Assessment & Plan   1. Type 2 diabetes mellitus with hyperglycemia, with long-term current use of insulin (HCC) - Glucose (CBG) - Ambulatory referral to Podiatry  2. Long term (current) use of insulin (HCC) Followed by endocrine - Glucose (CBG)  3. Diabetic polyneuropathy associated with type 1 diabetes mellitus (HCC) Will increase dose gabapentin - gabapentin (NEURONTIN) 300 MG capsule; Take 2 capsules (600 mg total) by mouth 2 (two) times daily. Needs to be seen prior to next refill request.  Dispense: 360 capsule; Refill: 3 - Ambulatory referral to Podiatry  4. Paronychia of toenail of right foot Salt water soaks in mildly warm salt water.  Cut V into nail - Ambulatory referral to Podiatry - mupirocin ointment (BACTROBAN) 2 %; Apply 1 Application topically 2  (two) times daily.  Dispense: 22 g; Refill: 0    Return in about 4 months (around 12/05/2023) for PCP for chronic conditions-Johnson.  The patient was given clear instructions to go to ER or return to medical center if symptoms don't improve, worsen or new problems develop. The patient verbalized understanding. The patient was told to call to get lab results if they haven't heard anything in the next week.      Georgian Co, PA-C [facility] and [address] [place], [place] [phone]   08/07/2023, 2:23 PMPatient ID: Haskel Schroeder, female   DOB: 05-Feb-1981, 42 y.o.   MRN: [id number]

## 2023-08-07 NOTE — Patient Instructions (Signed)
Paronychia Paronychia is an infection of the skin. It happens near a fingernail or toenail. It may cause pain and swelling around the nail. In some cases, a fluid-filled bump (abscess) can form near or under the nail. Often, this condition is not serious, and it clears up with treatment. What are the causes? This condition may be caused by a germ. The germ may be bacteria or a fungus. These germs can enter the body through an opening in the skin, such as a cut or a hangnail. Other causes include: Repeated injuries to your fingernails or toenails. Irritation of the base and sides of the nail (cuticle). What increases the risk? This condition is more likely to develop in people who: Get their hands wet often, such as a dishwasher. Bite their fingernails or the base and sides of their nails. Have other skin problems. Have hangnails or hurt fingertips. Come into contact with chemicals like detergents. Have diabetes. What are the signs or symptoms? Redness and swelling of the skin near the nail. A tender feeling around the nail. Pus-filled bumps under the skin at the base and sides of the nail. Fluid or pus under the nail. Pain in the area. How is this treated? Treatment depends on the cause of your condition and how bad it is. If your condition is mild, it may clear up on its own in a few days or after soaking in warm water. If needed, treatment may include: Antibiotic medicine. Antifungal medicine. A procedure to drain pus from a fluid-filled bump. Medicine to treat irritation and swelling (corticosteroids). Taking off part of an ingrown toenail. A bandage (dressing) may be placed over the nail area. Follow these instructions at home: Wound care Keep the affected area clean. Soak the fingers or toes in warm water as told by your doctor. You may be told to do this for 20 minutes, 2-3 times a day. Keep the area dry when you are not soaking it. Do not try to drain a fluid-filled bump on  your own. Follow instructions from your doctor about how to take care of the affected area. Make sure you: Wash your hands with soap and water for at least 20 seconds before and after you change your bandage. If you cannot use soap and water, use hand sanitizer. Change your bandage as told by your doctor. If you had a fluid-filled bump and your doctor drained it, check the area every day for signs of infection. Check for: Redness, swelling, or pain. Fluid or blood. Warmth. Pus or a bad smell. Medicines  Take over-the-counter and prescription medicines only as told by your doctor. If you were prescribed an antibiotic medicine, take it as told by your doctor. Do not stop taking it even if you start to feel better. General instructions Avoid contact with anything that irritates your skin or that you are allergic to. Do not pick at the affected area. Keep all follow-up visits. Prevention To prevent this condition from happening again: Wear rubber gloves when putting your hands in water for washing dishes or other tasks. Wear gloves if your hands might touch cleaners or chemicals. Avoid injuring your nails or fingertips. Do not bite your nails or tear hangnails. Do not cut your nails very short. Do not cut the skin at the base and sides of the nail. Use clean nail clippers or scissors when trimming nails. Contact a doctor if: You feel worse. You do not get better. You keep having or you have more fluid, blood, or   pus coming from the affected area. Your affected finger, toe, or joint gets swollen or hard to move. You have a fever or chills. There is redness spreading from the affected area. Summary Paronychia is an infection of the skin. It happens near a fingernail or toenail. This condition may cause pain and swelling around the nail. Soak the fingers or toes in warm water as told by your doctor. Often, this condition is not serious, and it clears up with treatment. This information  is not intended to replace advice given to you by your health care provider. Make sure you discuss any questions you have with your health care provider. Document Revised: 12/19/2020 Document Reviewed: 12/19/2020 Elsevier Patient Education  2024 [employer]  

## 2023-08-08 ENCOUNTER — Other Ambulatory Visit: Payer: Self-pay

## 2023-08-13 DIAGNOSIS — Z940 Kidney transplant status: Secondary | ICD-10-CM | POA: Diagnosis not present

## 2023-08-14 ENCOUNTER — Ambulatory Visit (INDEPENDENT_AMBULATORY_CARE_PROVIDER_SITE_OTHER): Payer: Medicaid Other | Admitting: Podiatry

## 2023-08-14 ENCOUNTER — Encounter: Payer: Self-pay | Admitting: Podiatry

## 2023-08-14 VITALS — Ht 61.0 in | Wt 196.8 lb

## 2023-08-14 DIAGNOSIS — L600 Ingrowing nail: Secondary | ICD-10-CM | POA: Diagnosis not present

## 2023-08-14 NOTE — Progress Notes (Signed)
Subjective:  Patient ID: Cathy Green, female    DOB: 12-12-1980,  MRN: [id number]  Chief Complaint  Patient presents with   Ingrown Toenail    Pt believes she has a ingrown on right greater toe.    42 y.o. female presents with the above complaint.  Patient presents with right hallux lateral border ingrown painful to touch is progressive and worse worse with ambulation worse with pressure she states that she is a diabetic last A1c of 8.1%.  She states it was infected but now is doing much better.  She wanted to get it evaluated and removed if if she is able to   Review of Systems: Negative except as noted in the HPI. Denies N/V/F/Ch.  Past Medical History:  Diagnosis Date   Abnormal Pap smear    Allergy    seasonal allergies   Anemia    Chronic kidney disease    Diabetes mellitus    diagnosed age 31   Diabetes mellitus without complication (HCC)    Glaucoma    Hyperlipidemia    Hypertension    Migraines    Noncompliance    Numbness in left leg     Current Outpatient Medications:    amLODipine (NORVASC) 5 MG tablet, Take 1 tablet (5 mg total) by mouth daily., Disp: 30 tablet, Rfl: 6   amLODipine (NORVASC) 5 MG tablet, Take 1 tablet by mouth daily., Disp: , Rfl:    atorvastatin (LIPITOR) 20 MG tablet, Take 1 tablet (20 mg total) by mouth daily., Disp: 90 tablet, Rfl: 3   Brinzolamide-Brimonidine (SIMBRINZA) 1-0.2 % SUSP, Instill 1 drop into both eyes twice a day, Disp: 8 mL, Rfl: 11   Continuous Glucose Sensor (DEXCOM G6 SENSOR) MISC, Apply every 10 days., Disp: 9 each, Rfl: 3   Continuous Glucose Transmitter (DEXCOM G6 TRANSMITTER) MISC, Use as directed., Disp: 1 each, Rfl: 3   cyanocobalamin 1000 MCG tablet, Take by mouth., Disp: , Rfl:    fluticasone (FLONASE) 50 MCG/ACT nasal spray, Place 2 sprays into both nostrils daily., Disp: 16 g, Rfl: 6   gabapentin (NEURONTIN) 300 MG capsule, Take 2 capsules (600 mg total) by mouth 2 (two) times daily. Needs to be seen prior to  next refill request., Disp: 360 capsule, Rfl: 3   glucose blood (CONTOUR NEXT TEST) test strip, Use as instructed to check blood sugar TID., Disp: 100 each, Rfl: 6   Glucose Blood (TRUE METRIX BLOOD GLUCOSE TEST VI), , Disp: , Rfl:    insulin aspart (NOVOLOG) 100 UNIT/ML injection, Inject 70 units max daily dose. Use with pump, Disp: 20 mL, Rfl: 11   Insulin Disposable Pump (OMNIPOD 5 DEXG7G6 PODS GEN 5) MISC, Use as directed and change pod every 3 days, Disp: 15 each, Rfl: 3   mupirocin ointment (BACTROBAN) 2 %, Apply 1 Application topically 2 (two) times daily., Disp: 22 g, Rfl: 0   nortriptyline (PAMELOR) 10 MG capsule, Take 1 capsule (10 mg total) by mouth at bedtime., Disp: 30 capsule, Rfl: 5   ondansetron (ZOFRAN) 4 MG tablet, Take 1 tablet (4 mg total) by mouth every 8 (eight) hours as needed for Nausea / Vomiting., Disp: 20 tablet, Rfl: 5   pantoprazole (PROTONIX) 40 MG tablet, Take 40 mg by mouth daily., Disp: , Rfl:    predniSONE (DELTASONE) 5 MG tablet, Take 5 mg by mouth daily with breakfast., Disp: , Rfl:    rizatriptan (MAXALT-MLT) 10 MG disintegrating tablet, Take 1 tablet (10 mg total) by mouth as needed  for migraine. May repeat in 2 hours if needed.  Maximum 2 tablets in 24 hours., Disp: 9 tablet, Rfl: 5   sulfamethoxazole-trimethoprim (BACTRIM) 400-80 MG tablet, Take by mouth., Disp: , Rfl:    sulfamethoxazole-trimethoprim (BACTRIM) 400-80 MG tablet, Take by mouth., Disp: , Rfl:    tacrolimus (PROGRAF) 1 MG capsule, Take 1 mg by mouth 2 (two) times daily. 2 cap in the morning, and 2 cap at night, Disp: , Rfl:    tacrolimus (PROGRAF) 1 MG capsule, Take by mouth., Disp: , Rfl:   Social History   Tobacco Use  Smoking Status Never  Smokeless Tobacco Never    Allergies  Allergen Reactions   Garlic Nausea And Vomiting   Objective:  There were no vitals filed for this visit. Body mass index is 37.19 kg/m. Constitutional Well developed. Well nourished.  Vascular Dorsalis  pedis pulses palpable bilaterally. Posterior tibial pulses palpable bilaterally. Capillary refill normal to all digits.  No cyanosis or clubbing noted. Pedal hair growth normal.  Neurologic Normal speech. Oriented to person, place, and time. Epicritic sensation to light touch grossly present bilaterally.  Dermatologic Painful ingrowing nail at lateral nail borders of the hallux nail right. No other open wounds. No skin lesions.  Orthopedic: Normal joint ROM without pain or crepitus bilaterally. No visible deformities. No bony tenderness.   Radiographs: None Assessment:   1. Ingrown toenail of right foot    Plan:  Patient was evaluated and treated and all questions answered.  Ingrown Nail, right -I discussed with the patient the nature of the ingrown currently it is not infected however she has uncontrolled diabetes therefore removing the ingrown would not be a good decision.  I encouraged her to continue managing conservatively and bring her A1c down once her A1c is below 8% we will plan on removing it.

## 2023-08-16 ENCOUNTER — Other Ambulatory Visit: Payer: Self-pay

## 2023-08-19 ENCOUNTER — Other Ambulatory Visit: Payer: Self-pay

## 2023-08-19 ENCOUNTER — Other Ambulatory Visit (HOSPITAL_COMMUNITY): Payer: Self-pay

## 2023-08-19 NOTE — Telephone Encounter (Signed)
Questions have expired.  New key: [id number] Clinical info has been submitted

## 2023-08-20 ENCOUNTER — Other Ambulatory Visit: Payer: Self-pay

## 2023-08-21 NOTE — Telephone Encounter (Signed)
Pharmacy Patient Advocate Encounter  Received notification from [facility] that Prior Authorization for Dexcom G6 Sensor has been APPROVED from 08-19-2023 to 02-16-2024   PA #/Case ID/Reference #: [id number]

## 2023-08-22 ENCOUNTER — Other Ambulatory Visit: Payer: Self-pay

## 2023-08-22 DIAGNOSIS — Z940 Kidney transplant status: Secondary | ICD-10-CM | POA: Diagnosis not present

## 2023-08-23 ENCOUNTER — Other Ambulatory Visit: Payer: Self-pay

## 2023-08-28 ENCOUNTER — Other Ambulatory Visit: Payer: Self-pay

## 2023-08-30 ENCOUNTER — Other Ambulatory Visit: Payer: Self-pay

## 2023-09-02 ENCOUNTER — Other Ambulatory Visit: Payer: Self-pay

## 2023-09-06 ENCOUNTER — Other Ambulatory Visit: Payer: Self-pay

## 2023-09-12 ENCOUNTER — Other Ambulatory Visit: Payer: Self-pay

## 2023-09-13 ENCOUNTER — Ambulatory Visit: Payer: 59 | Admitting: Internal Medicine

## 2023-09-17 ENCOUNTER — Other Ambulatory Visit: Payer: Self-pay

## 2023-09-19 ENCOUNTER — Other Ambulatory Visit: Payer: Self-pay

## 2023-09-20 ENCOUNTER — Other Ambulatory Visit: Payer: Self-pay

## 2023-09-20 DIAGNOSIS — H2513 Age-related nuclear cataract, bilateral: Secondary | ICD-10-CM | POA: Diagnosis not present

## 2023-09-20 DIAGNOSIS — E103413 Type 1 diabetes mellitus with severe nonproliferative diabetic retinopathy with macular edema, bilateral: Secondary | ICD-10-CM | POA: Diagnosis not present

## 2023-09-26 ENCOUNTER — Other Ambulatory Visit: Payer: Self-pay

## 2023-10-01 ENCOUNTER — Other Ambulatory Visit: Payer: Self-pay

## 2023-10-11 ENCOUNTER — Other Ambulatory Visit: Payer: Self-pay | Admitting: Internal Medicine

## 2023-10-11 ENCOUNTER — Other Ambulatory Visit: Payer: Self-pay

## 2023-10-11 ENCOUNTER — Encounter: Payer: Self-pay | Admitting: Internal Medicine

## 2023-10-11 DIAGNOSIS — E1021 Type 1 diabetes mellitus with diabetic nephropathy: Secondary | ICD-10-CM | POA: Diagnosis not present

## 2023-10-11 DIAGNOSIS — Z79899 Other long term (current) drug therapy: Secondary | ICD-10-CM | POA: Diagnosis not present

## 2023-10-11 DIAGNOSIS — Z4822 Encounter for aftercare following kidney transplant: Secondary | ICD-10-CM | POA: Diagnosis not present

## 2023-10-11 DIAGNOSIS — Z5181 Encounter for therapeutic drug level monitoring: Secondary | ICD-10-CM | POA: Diagnosis not present

## 2023-10-11 DIAGNOSIS — D849 Immunodeficiency, unspecified: Secondary | ICD-10-CM | POA: Diagnosis not present

## 2023-10-11 DIAGNOSIS — Z940 Kidney transplant status: Secondary | ICD-10-CM | POA: Diagnosis not present

## 2023-10-11 DIAGNOSIS — Z79621 Long term (current) use of calcineurin inhibitor: Secondary | ICD-10-CM | POA: Diagnosis not present

## 2023-10-11 DIAGNOSIS — B259 Cytomegaloviral disease, unspecified: Secondary | ICD-10-CM | POA: Diagnosis not present

## 2023-10-11 DIAGNOSIS — I10 Essential (primary) hypertension: Secondary | ICD-10-CM | POA: Diagnosis not present

## 2023-10-11 MED ORDER — OMNIPOD 5 DEXG7G6 PODS GEN 5 MISC
1.0000 | 3 refills | Status: DC
  Filled 2023-10-11: qty 10, 30d supply, fill #0
  Filled 2023-11-09: qty 10, 30d supply, fill #1

## 2023-10-15 ENCOUNTER — Other Ambulatory Visit: Payer: Self-pay

## 2023-10-18 ENCOUNTER — Other Ambulatory Visit: Payer: Self-pay

## 2023-10-30 ENCOUNTER — Encounter: Payer: Self-pay | Admitting: Neurology

## 2023-10-30 ENCOUNTER — Other Ambulatory Visit: Payer: Self-pay

## 2023-10-30 ENCOUNTER — Ambulatory Visit (INDEPENDENT_AMBULATORY_CARE_PROVIDER_SITE_OTHER): Payer: Medicaid Other | Admitting: Neurology

## 2023-10-30 VITALS — BP 160/82 | HR 104 | Ht 62.0 in | Wt 197.0 lb

## 2023-10-30 DIAGNOSIS — G43019 Migraine without aura, intractable, without status migrainosus: Secondary | ICD-10-CM | POA: Diagnosis not present

## 2023-10-30 MED ORDER — RIZATRIPTAN BENZOATE 10 MG PO TBDP
10.0000 mg | ORAL_TABLET | ORAL | 5 refills | Status: DC | PRN
  Filled 2023-10-30 – 2024-04-20 (×2): qty 9, 30d supply, fill #0

## 2023-10-30 MED ORDER — NORTRIPTYLINE HCL 25 MG PO CAPS
25.0000 mg | ORAL_CAPSULE | Freq: Every day | ORAL | 5 refills | Status: DC
  Filled 2023-10-30: qty 30, 30d supply, fill #0
  Filled 2023-11-24: qty 30, 30d supply, fill #1
  Filled 2023-12-05 – 2023-12-26 (×2): qty 30, 30d supply, fill #2
  Filled 2024-01-26: qty 30, 30d supply, fill #3
  Filled 2024-02-18: qty 30, 30d supply, fill #4
  Filled 2024-04-20: qty 30, 30d supply, fill #5

## 2023-10-30 NOTE — Patient Instructions (Signed)
Increase nortriptyline to 25mg  at bedtime. Take rizatriptan as first line.  If migraine does not respond to rizatriptan, try Nurtec (1 tablet daily as needed).  Let me know if it works and want prescription Follow up 6 months.

## 2023-10-30 NOTE — Progress Notes (Signed)
NEUROLOGY FOLLOW UP OFFICE NOTE  Cathy Green [id number]  Assessment/Plan:   Migraine without aura, without status migrainosus, intractable - improved but still has occasional intractable migraine that does not respond to rizatriptan Hypertension   Migraine prevention:  Increase nortriptyline to 25mg  at bedtime.   Migraine rescue:  rizatriptan 10mg .  Will provide samples of Nurtec to take as second line if needed; Zofran for nausea Limit use of pain relievers to no more than 2 days out of week to prevent risk of rebound or medication-overuse headache. Keep headache diary Follow up with PCP regarding blood pressure Follow up 6 months.       Subjective:  Cathy Green is a 43 year old female with HTN, HLD, DM I, CKD s/p kidney transplant, and glaucoma who follows up for migraines.  UPDATE: Started nortriptyline Migraines improved. Intensity:  5-8/10 (sometimes 10) Duration:  within 2 hours with rizatriptan (sometimes repeats dose).  Once or twice, she had a migraine lasting up to 5 days.  Not sure why it was intractable. Frequency:  3 days a month Frequency of abortive medication: none Current NSAIDS/analgesics:  none Current triptans:  rizatriptan 10mg   Current ergotamine:  none Current anti-emetic:  ondansetron 4mg  Current muscle relaxants:  none Current Antihypertensive medications:  amlodipine 5mg  daily Current Antidepressant medications:  gabapentin 300mg  BID (neuralgia) Current Anticonvulsant medications:  nortriptyline 10mg  at bedtime  Current anti-CGRP:  none Current Vitamins/Herbal/Supplements:  none Current Antihistamines/Decongestants:  Flonase Other therapy:  none Birth control:  no Other medication:  prednisone 5mg  daily    Caffeine:  1 cup coffee 3 times a week.   Diet:  at least 64 oz water.  Decaff soda.  Skips meals Exercise:  no Depression:  no; Anxiety:  no Other pain:  no Sleep hygiene:  good.  At least 6 hours sleep a night.  HISTORY:   Onset:  43 years old.  Worse over past 2 years. Location:  may start over right eye but progresses to holocephalic Quality:  pressure, throbbing, stabbing (right eye) Intensity:  10/10.   Aura:  absent Prodrome:  absent Associated symptoms:  Blurred vision, nausea, photophobia, phonophobia.  She denies associated   vomiting, osmophobia, unilateral numbness or weakness. Duration:  2 days on average Frequency:  1 to 2 times a week (total 5-6 days a week) Frequency of abortive medication: 0 Triggers:  menstrual cycle, weather Relieving factors:  cold mask, dark room, rest Activity:  aggravate  Eye exam reportedly unremarkable.   Past NSAIDS/analgesics:  acetaminophen, ASA, ibuprofen - CANNOT TAKE NSAIDS DUE TO KIDNEY DISEASE Past abortive triptans:  unsure Past abortive ergotamine:  none Past muscle relaxants:  methocarbamol Past anti-emetic:  promethazine Past antihypertensive medications:  carvedilol, lisinopril Past antidepressant medications:  none Past anticonvulsant medications:  none Past anti-CGRP:  none Past vitamins/Herbal/Supplements:  none Past antihistamines/decongestants:  none Other past therapies:  none    Family history of headache:  no  PAST MEDICAL HISTORY: Past Medical History:  Diagnosis Date   Abnormal Pap smear    Allergy    seasonal allergies   Anemia    Chronic kidney disease    Diabetes mellitus    diagnosed age 58   Diabetes mellitus without complication (HCC)    Glaucoma    Hyperlipidemia    Hypertension    Migraines    Noncompliance    Numbness in left leg     MEDICATIONS: Current Outpatient Medications on File Prior to Visit  Medication Sig Dispense Refill  amLODipine (NORVASC) 5 MG tablet Take 1 tablet (5 mg total) by mouth daily. 30 tablet 6   amLODipine (NORVASC) 5 MG tablet Take 1 tablet by mouth daily.     atorvastatin (LIPITOR) 20 MG tablet Take 1 tablet (20 mg total) by mouth daily. 90 tablet 3   Brinzolamide-Brimonidine  (SIMBRINZA) 1-0.2 % SUSP Instill 1 drop into both eyes twice a day 8 mL 11   Continuous Glucose Sensor (DEXCOM G6 SENSOR) MISC Apply every 10 days. 9 each 3   Continuous Glucose Transmitter (DEXCOM G6 TRANSMITTER) MISC Use as directed. 1 each 3   cyanocobalamin 1000 MCG tablet Take by mouth.     fluticasone (FLONASE) 50 MCG/ACT nasal spray Place 2 sprays into both nostrils daily. 16 g 6   gabapentin (NEURONTIN) 300 MG capsule Take 2 capsules (600 mg total) by mouth 2 (two) times daily. Needs to be seen prior to next refill request. 360 capsule 3   glucose blood (CONTOUR NEXT TEST) test strip Use as instructed to check blood sugar TID. 100 each 6   Glucose Blood (TRUE METRIX BLOOD GLUCOSE TEST VI)      insulin aspart (NOVOLOG) 100 UNIT/ML injection Inject 70 units max daily dose. Use with pump 20 mL 11   Insulin Disposable Pump (OMNIPOD 5 DEXG7G6 PODS GEN 5) MISC Use as directed and change pod every 3 days 15 each 3   mupirocin ointment (BACTROBAN) 2 % Apply 1 Application topically 2 (two) times daily. 22 g 0   nortriptyline (PAMELOR) 10 MG capsule Take 1 capsule (10 mg total) by mouth at bedtime. 30 capsule 5   ondansetron (ZOFRAN) 4 MG tablet Take 1 tablet (4 mg total) by mouth every 8 (eight) hours as needed for Nausea / Vomiting. 20 tablet 5   pantoprazole (PROTONIX) 40 MG tablet Take 40 mg by mouth daily.     predniSONE (DELTASONE) 5 MG tablet Take 5 mg by mouth daily with breakfast.     rizatriptan (MAXALT-MLT) 10 MG disintegrating tablet Take 1 tablet (10 mg total) by mouth as needed for migraine. May repeat in 2 hours if needed.  Maximum 2 tablets in 24 hours. 9 tablet 5   sulfamethoxazole-trimethoprim (BACTRIM) 400-80 MG tablet Take by mouth.     sulfamethoxazole-trimethoprim (BACTRIM) 400-80 MG tablet Take by mouth.     tacrolimus (PROGRAF) 1 MG capsule Take 1 mg by mouth 2 (two) times daily. 2 cap in the morning, and 2 cap at night     tacrolimus (PROGRAF) 1 MG capsule Take by mouth.      No current facility-administered medications on file prior to visit.    ALLERGIES: Allergies  Allergen Reactions   Garlic Nausea And Vomiting    FAMILY HISTORY: Family History  Problem Relation Age of Onset   Hypertension Mother    Stroke Mother    Hypertension Father    Cancer Maternal Grandfather        prostate   Breast cancer Cousin        maternal 1st cousin   Cancer Other    COPD Other    Hyperlipidemia Other       Objective:  Blood pressure (!) 160/82, pulse (!) 104, height 5\' 2"  (1.575 m), weight 197 lb (89.4 kg), SpO2 92%. General: No acute distress.  Patient appears well-groomed.   Head:  Normocephalic/atraumatic Neck:  Supple.  No paraspinal tenderness.  Full range of motion. Heart:  Regular rate and rhythm. Neuro:  Alert and oriented.  Speech fluent  and not dysarthric.  Language intact.  CN II-XII intact.  Bulk and tone normal.  Muscle strength 5/5 throughout.  Deep tendon reflexes 2+ throughout.  Gait normal.  Romberg negative.    Shon Millet, DO  CC: Jonah Blue, MD

## 2023-11-01 ENCOUNTER — Other Ambulatory Visit: Payer: Self-pay

## 2023-11-11 ENCOUNTER — Other Ambulatory Visit: Payer: Self-pay

## 2023-11-18 DIAGNOSIS — Z940 Kidney transplant status: Secondary | ICD-10-CM | POA: Diagnosis not present

## 2023-11-25 ENCOUNTER — Ambulatory Visit (INDEPENDENT_AMBULATORY_CARE_PROVIDER_SITE_OTHER): Payer: Medicaid Other | Admitting: Internal Medicine

## 2023-11-25 ENCOUNTER — Encounter: Payer: Self-pay | Admitting: Internal Medicine

## 2023-11-25 ENCOUNTER — Other Ambulatory Visit: Payer: Self-pay

## 2023-11-25 VITALS — BP 130/70 | HR 100 | Ht 62.0 in | Wt 196.0 lb

## 2023-11-25 DIAGNOSIS — E103413 Type 1 diabetes mellitus with severe nonproliferative diabetic retinopathy with macular edema, bilateral: Secondary | ICD-10-CM

## 2023-11-25 DIAGNOSIS — Z940 Kidney transplant status: Secondary | ICD-10-CM

## 2023-11-25 DIAGNOSIS — N1832 Chronic kidney disease, stage 3b: Secondary | ICD-10-CM | POA: Diagnosis not present

## 2023-11-25 DIAGNOSIS — E1069 Type 1 diabetes mellitus with other specified complication: Secondary | ICD-10-CM | POA: Diagnosis not present

## 2023-11-25 DIAGNOSIS — E1042 Type 1 diabetes mellitus with diabetic polyneuropathy: Secondary | ICD-10-CM | POA: Diagnosis not present

## 2023-11-25 DIAGNOSIS — E1022 Type 1 diabetes mellitus with diabetic chronic kidney disease: Secondary | ICD-10-CM

## 2023-11-25 DIAGNOSIS — E785 Hyperlipidemia, unspecified: Secondary | ICD-10-CM

## 2023-11-25 LAB — BASIC METABOLIC PANEL WITH GFR: Creatinine: 1.6 — AB (ref 0.5–1.1)

## 2023-11-25 LAB — POCT GLYCOSYLATED HEMOGLOBIN (HGB A1C): Hemoglobin A1C: 8.5 % — AB (ref 4.0–5.6)

## 2023-11-25 LAB — COMPREHENSIVE METABOLIC PANEL WITH GFR: eGFR: 41

## 2023-11-25 MED ORDER — DEXCOM G7 SENSOR MISC
1.0000 | 3 refills | Status: DC
  Filled 2023-11-25: qty 9, 90d supply, fill #0
  Filled 2024-02-18: qty 9, 90d supply, fill #1

## 2023-11-25 MED ORDER — ATORVASTATIN CALCIUM 20 MG PO TABS
20.0000 mg | ORAL_TABLET | Freq: Every day | ORAL | 3 refills | Status: DC
  Filled 2023-12-26: qty 90, 90d supply, fill #0

## 2023-11-25 MED ORDER — INSULIN ASPART 100 UNIT/ML IJ SOLN
INTRAMUSCULAR | 11 refills | Status: DC
  Filled 2023-11-25: qty 30, 38d supply, fill #0
  Filled 2023-12-26: qty 30, 38d supply, fill #1
  Filled 2024-02-18: qty 30, 38d supply, fill #2

## 2023-11-25 MED ORDER — OMNIPOD 5 DEXG7G6 PODS GEN 5 MISC
1.0000 | 3 refills | Status: DC
  Filled 2023-11-25: qty 30, 90d supply, fill #0
  Filled 2023-12-02: qty 10, 30d supply, fill #0
  Filled 2023-12-29: qty 10, 30d supply, fill #1
  Filled 2024-01-26: qty 10, 30d supply, fill #2
  Filled 2024-02-20: qty 10, 30d supply, fill #3

## 2023-11-25 NOTE — Progress Notes (Signed)
 Name: Cathy Green  Age/ Sex: 43 y.o., female   MRN/ DOB: [id number], 19-Jun-1981     PCP: Marcine Matar, MD   Reason for Endocrinology Evaluation: Type 1 Diabetes Mellitus  Initial Endocrine Consultative Visit: 11/14/2020    PATIENT IDENTIFIER: Cathy Green is a 43 y.o. female with a past medical history of T1DM , HTN and Dyslipidemia and hx of renal transplant. The patient has followed with Endocrinology clinic since 11/14/2020 for consultative assistance with management of her diabetes.  DIABETIC HISTORY:  Cathy Green was diagnosed with DM at age 59, Metformin caused GI side effects .Marland Kitchen Her hemoglobin A1c has ranged from 6.2% in 01/2020, peaking at 8.1% in 08/2020.  S/P renal transplant 06/2020- Through [facility]  No hx of dialysis    On her initial visit to our clinic she had an A1c of 6.5 %, we adjusted MDI regimen and provided her with correction scale  She was started on OmniPod in July 2023   SUBJECTIVE:   During the last visit (06/28/2023): A1c 8.2%     Today (11/25/2023): Cathy Green is here for a follow up on diabetes.  She has not been using the dexcom because she thre    She continues to follow-up with kidney transplant team  Denies nausea or vomiting  Denies constipation or diarrhea    This patient with type 1 diabetes is treated with Omnipod (insulin pump). During the visit the pump basal and bolus doses were reviewed including carb/insulin rations and supplemental doses. The clinical list was updated. The glucose meter download was reviewed in detail to determine if the current pump settings are providing the best glycemic control without excessive hypoglycemia.  Pump and meter download:    Pump   Omnipod Settings   Insulin type   Novolog   Basal rate       0000 1.20 u/h    1600 1.30          I:C ratio       0000 1:10   1600 1:8              Sensitivity       0000  35      Goal       0000  120            Type & Model of Pump:  Omnipod Insulin Type: Currently using Novolog .  PUMP STATISTICS: Average BG: 182 Average Daily Carbs (g): 195.2 Average Total Daily Insulin: 62.5 Average Daily Basal: 39.5 (63 %) Average Daily Bolus:23 (37 %)    HOME DIABETES REGIMEN:  Novolog      Statin: yes ACE-I/ARB: no Prior Diabetic Education: yes    CONTINUOUS GLUCOSE MONITORING RECORD INTERPRETATION    Dates of Recording: 2/11-2/24/2025  Sensor description: Dexcom  Results statistics:   CGM use % of time 88.6  Average and SD 182/65  Time in range  53  %  % Time Above 180 28  % Time above 250 18  % Time Below target 1   Glycemic patterns summary: BG's trend down overnight and fluctuate during the day  Hyperglycemic episodes postprandial  Hypoglycemic episodes occurred variable times  Overnight periods: Mostly optimal       DIABETIC COMPLICATIONS: Microvascular complications:  Neuropathy, CKD (S/P transplant 2021), retinopathy ( S/P injections) Last eye exam: Completed 09/20/2023   Macrovascular complications:   Denies: CAD, CVA, PVD   HISTORY:  Past Medical History:  Past Medical History:  Diagnosis Date  Abnormal Pap smear    Allergy    seasonal allergies   Anemia    Chronic kidney disease    Diabetes mellitus    diagnosed age 32   Diabetes mellitus without complication (HCC)    Glaucoma    Hyperlipidemia    Hypertension    Migraines    Noncompliance    Numbness in left leg    Past Surgical History:  Past Surgical History:  Procedure Laterality Date   KIDNEY TRANSPLANT  06/2020   WISDOM TOOTH EXTRACTION     Social History:  reports that she has never smoked. She has never used smokeless tobacco. She reports current alcohol use. She reports that she does not use drugs. Family History:  Family History  Problem Relation Age of Onset   Hypertension Mother    Stroke Mother    Hypertension Father    Cancer Maternal Grandfather        prostate   Breast cancer Cousin         maternal 1st cousin   Cancer Other    COPD Other    Hyperlipidemia Other      HOME MEDICATIONS: Allergies as of 11/25/2023       Reactions   Garlic Nausea And Vomiting        Medication List        Accurate as of November 25, 2023  2:07 PM. If you have any questions, ask your nurse or doctor.          amLODipine 5 MG tablet Commonly known as: NORVASC Take 1 tablet (5 mg total) by mouth daily.   amLODipine 5 MG tablet Commonly known as: NORVASC Take 1 tablet by mouth daily.   atorvastatin 20 MG tablet Commonly known as: LIPITOR Take 1 tablet (20 mg total) by mouth daily.   azaTHIOprine 50 MG tablet Commonly known as: IMURAN Take 100 mg by mouth daily.   TRUE METRIX BLOOD GLUCOSE TEST VI   Contour Next Test test strip Generic drug: glucose blood Use as instructed to check blood sugar TID.   cyanocobalamin 1000 MCG tablet Take by mouth.   Dexcom G6 Sensor Misc Apply every 10 days.   Dexcom G6 Transmitter Misc Use as directed.   fluticasone 50 MCG/ACT nasal spray Commonly known as: FLONASE Place 2 sprays into both nostrils daily.   gabapentin 300 MG capsule Commonly known as: NEURONTIN Take 2 capsules (600 mg total) by mouth 2 (two) times daily. Needs to be seen prior to next refill request.   mupirocin ointment 2 % Commonly known as: BACTROBAN Apply 1 Application topically 2 (two) times daily.   nortriptyline 10 MG capsule Commonly known as: PAMELOR Take 1 capsule (10 mg total) by mouth at bedtime.   nortriptyline 25 MG capsule Commonly known as: PAMELOR Take 1 capsule (25 mg total) by mouth at bedtime.   NovoLOG 100 UNIT/ML injection Generic drug: insulin aspart Inject 70 units max daily dose. Use with pump   Omnipod 5 DexG7G6 Pods Gen 5 Misc Use as directed and change pod every 3 days   ondansetron 4 MG tablet Commonly known as: ZOFRAN Take 1 tablet (4 mg total) by mouth every 8 (eight) hours as needed for Nausea / Vomiting.    pantoprazole 40 MG tablet Commonly known as: PROTONIX Take 40 mg by mouth daily.   predniSONE 5 MG tablet Commonly known as: DELTASONE Take 5 mg by mouth daily with breakfast.   rizatriptan 10 MG disintegrating tablet Commonly known as:  MAXALT-MLT Take 1 tablet (10 mg total) by mouth as needed for migraine. May repeat in 2 hours if needed.  Maximum 2 tablets in 24 hours.   Simbrinza 1-0.2 % Susp Generic drug: Brinzolamide-Brimonidine Instill 1 drop into both eyes twice a day   sulfamethoxazole-trimethoprim 400-80 MG tablet Commonly known as: BACTRIM Take by mouth.   sulfamethoxazole-trimethoprim 400-80 MG tablet Commonly known as: BACTRIM Take by mouth.   tacrolimus 1 MG capsule Commonly known as: PROGRAF Take 1 mg by mouth 2 (two) times daily. 2 cap in the morning, and 2 cap at night   tacrolimus 1 MG capsule Commonly known as: PROGRAF Take by mouth.         OBJECTIVE:   Vital Signs: BP 130/70 (BP Location: Left Arm, Patient Position: Sitting, Cuff Size: Normal)   Pulse 100   Ht 5\' 2"  (1.575 m)   Wt 196 lb (88.9 kg)   SpO2 97%   BMI 35.85 kg/m   Wt Readings from Last 3 Encounters:  11/25/23 196 lb (88.9 kg)  10/30/23 197 lb (89.4 kg)  08/14/23 196 lb 12.8 oz (89.3 kg)     Exam: General: Pt appears well and is in NAD  Lungs: Clear with good BS bilat  Heart: RRR   Extremities: trace pretibial edema.   Neuro: MS is good with appropriate affect, pt is alert and Ox3   DM foot exam: 06/28/2023  The skin of the feet is intact without sores or ulcerations. The pedal pulses are 2+ on right and 2+ on left. The sensation is intact to a screening 5.07, 10 gram monofilament bilaterally           DATA REVIEWED:  Lab Results  Component Value Date   HGBA1C 8.2 (A) 06/28/2023   HGBA1C 11.2 (A) 03/13/2023   HGBA1C 8.1 (A) 07/09/2022   Labs on 11/18/2023 through [facility] Gluc 261 BUN 29 Cr 1.61 GFR 41   ASSESSMENT / PLAN / RECOMMENDATIONS:    1) Type 1 Diabetes Mellitus, Poorly controlled, With neuropathic, retinopathic , CKD III complications and S/P renal transplant   - Most recent A1c of 8.5  %. Goal A1c < 7.0 %.    -A1c is trending up -Patient has been noted with postprandial hyperglycemia, I will adjust her insulin to carb ratio as below -I will also change her sensitivity factor from 35 to 30 as below  MEDICATIONS:    Pump   Omnipod Settings   Insulin type   Novolog   Basal rate       0000 1.20 u/h    1600 1.30          I:C ratio       0000 1:7   1600 1:7              Sensitivity       0000  30      Goal       0000  120           EDUCATION / INSTRUCTIONS: BG monitoring instructions: Patient is instructed to check her blood sugars 3 times a day, before meals  Call [facility] if: BG persistently < 70  I reviewed the Rule of 15 for the treatment of hypoglycemia in detail with the patient. Literature supplied.    2) Diabetic complications:  Eye: Does  have known diabetic retinopathy.  Neuro/ Feet: Does have known diabetic peripheral neuropathy .  Renal: Patient does  have known baseline CKD, she is  status post renal transplant. She   is not on an ACEI/ARB at present.    3) Dyslipidemia:   -LDL remains at goal  -No changes   Medication Continue atorvastatin 20 mg daily   F/U in 4 months   Signed electronically by: Lyndle Herrlich, MD  [facility]  [facility] [address] [place] [place], [place] [address] Phone: [phone] FAX: [phone]   CC: Marcine Matar, MD [address] [place] [address] Phone: [phone]  Fax: [phone]  Return to Endocrinology clinic as below: Future Appointments  Date Time Provider Department Center  12/06/2023  2:30 PM Marcine Matar, MD [id number] None  04/29/2024  1:30 PM Drema Dallas, DO [id number] None

## 2023-11-25 NOTE — Patient Instructions (Signed)

## 2023-11-26 ENCOUNTER — Encounter: Payer: Self-pay | Admitting: Internal Medicine

## 2023-12-02 ENCOUNTER — Other Ambulatory Visit: Payer: Self-pay

## 2023-12-06 ENCOUNTER — Ambulatory Visit: Payer: Medicaid Other | Attending: Internal Medicine | Admitting: Internal Medicine

## 2023-12-06 ENCOUNTER — Other Ambulatory Visit: Payer: Self-pay

## 2023-12-06 ENCOUNTER — Encounter: Payer: Self-pay | Admitting: Internal Medicine

## 2023-12-06 VITALS — BP 166/104 | HR 99 | Temp 98.2°F | Ht 62.0 in | Wt 195.0 lb

## 2023-12-06 DIAGNOSIS — Z794 Long term (current) use of insulin: Secondary | ICD-10-CM

## 2023-12-06 DIAGNOSIS — E669 Obesity, unspecified: Secondary | ICD-10-CM | POA: Diagnosis not present

## 2023-12-06 DIAGNOSIS — Z23 Encounter for immunization: Secondary | ICD-10-CM | POA: Diagnosis not present

## 2023-12-06 DIAGNOSIS — N1832 Chronic kidney disease, stage 3b: Secondary | ICD-10-CM

## 2023-12-06 DIAGNOSIS — Z940 Kidney transplant status: Secondary | ICD-10-CM

## 2023-12-06 DIAGNOSIS — Z6835 Body mass index (BMI) 35.0-35.9, adult: Secondary | ICD-10-CM | POA: Diagnosis not present

## 2023-12-06 DIAGNOSIS — I1310 Hypertensive heart and chronic kidney disease without heart failure, with stage 1 through stage 4 chronic kidney disease, or unspecified chronic kidney disease: Secondary | ICD-10-CM

## 2023-12-06 DIAGNOSIS — E1059 Type 1 diabetes mellitus with other circulatory complications: Secondary | ICD-10-CM

## 2023-12-06 DIAGNOSIS — E1040 Type 1 diabetes mellitus with diabetic neuropathy, unspecified: Secondary | ICD-10-CM | POA: Diagnosis not present

## 2023-12-06 DIAGNOSIS — E1069 Type 1 diabetes mellitus with other specified complication: Secondary | ICD-10-CM | POA: Diagnosis not present

## 2023-12-06 MED ORDER — AMLODIPINE BESYLATE 5 MG PO TABS
7.5000 mg | ORAL_TABLET | Freq: Every day | ORAL | 1 refills | Status: DC
  Filled 2023-12-06: qty 135, 90d supply, fill #0
  Filled 2024-03-23: qty 135, 90d supply, fill #1

## 2023-12-06 NOTE — Progress Notes (Signed)
 Patient ID: Cathy Green, female    DOB: 02/21/1981  MRN: [id number]  CC: Hypertension (HTN f/u. /No questions / concerns/Yes to pneumonia & flu vax)   Subjective: Cathy Green is a 43 y.o. female who presents for chronic ds management. 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   Discussed the use of AI scribe software for clinical note transcription with the patient, who gave verbal consent to proceed.  History of Present Illness   The patient, with a history of diabetes, kidney transplant, and diabetic retinopathy, presents for routine follow-up.   DM: She is followed by endocrinologist Dr. Brooks Sailors.  Recently seen by her the end of last month.  A1c at that time was 8.5.  Patient on insulin pump with NovoLog. She denies use of long-acting insulin. She has been trying to improve her diet and lose weight, but has been struggling due to ongoing LT foot pain. She also has diabetic retinopathy, which has not required shots in the eye recently, but she is due for cataract surgery. She has been using Simbrina eye drops for this condition.  Dr. Clelia Croft is her retinal specialist and Dr. Dione Booze is her regular ophthalmologist.  She last saw him September of last year.   The foot pain, initially diagnosed as a stress fracture 11/2022, has been ongoing for a year and has recently been attributed to an issue with the arch of her foot. She has been using a boot intermittently for the past year, but is unable to wear it at work, which her podiatrist believes is hindering her healing. The pain has limited her ability to exercise, particularly walking, which she used to do regularly.  She still tries to walk at least 3 times a week.  CKD/kidney transplant: She is on azathioprine, prednisone, and tacrolimus.  She is followed by transplant clinic at [facility].  She reports no concerns from her transplant team about her kidney function.   HTN: She has been monitoring her blood pressure at home, which is typically in the range of 116-128/70-85. However, her blood pressure was significantly elevated during the visit. She is currently on amlodipine 5 mg for blood pressure management.      Patient Active Problem List   Diagnosis Date Noted   Dyslipidemia 06/28/2023   Type 1 diabetes mellitus with hyperglycemia (HCC) 07/09/2022   Type 1 diabetes mellitus with diabetic polyneuropathy (HCC) 11/14/2020   Diabetes mellitus type 1, uncontrolled, with complications 11/14/2020   Type 1 diabetes mellitus with severe nonproliferative retinopathy of both eyes and macular edema (HCC) 11/14/2020   Type 1 diabetes mellitus with stage 3b chronic kidney disease (HCC) 11/14/2020   Hyperlipidemia due to type 1 diabetes mellitus (HCC) 08/02/2020   Hypertensive kidney disease 08/02/2020   Kidney transplant recipient 08/02/2020   Hypoglycemia due to insulin 02/12/2020   CKD stage 5 due to type 1 diabetes mellitus (HCC) 09/13/2019   Migraine without aura and without status migrainosus, not intractable 06/22/2019   Chest pain 11/04/2018   Hyperkalemia 11/04/2018   Diabetic retinopathy (HCC) 04/26/2017   Proteinuria 11/01/2015   Normocytic anemia 09/07/2011   Hyperlipidemia 09/07/2011   Hypertension 09/07/2011     Current Outpatient Medications on File Prior to Visit  Medication Sig Dispense Refill   atorvastatin (LIPITOR) 20 MG tablet Take 1 tablet (20 mg  total) by mouth daily. 90 tablet 3   azaTHIOprine (IMURAN) 50 MG tablet Take 100 mg by mouth daily.     Brinzolamide-Brimonidine (SIMBRINZA) 1-0.2 % SUSP Instill 1 drop into both eyes twice a day 8 mL 11   Continuous Glucose Sensor (DEXCOM G7 SENSOR) MISC Change sensore every 10 days. 9 each 3   cyanocobalamin 1000 MCG tablet Take by mouth.     fluticasone (FLONASE) 50 MCG/ACT nasal spray Place 2  sprays into both nostrils daily. 16 g 6   gabapentin (NEURONTIN) 300 MG capsule Take 2 capsules (600 mg total) by mouth 2 (two) times daily. Needs to be seen prior to next refill request. 360 capsule 3   glucose blood (CONTOUR NEXT TEST) test strip Use as instructed to check blood sugar TID. 100 each 6   Glucose Blood (TRUE METRIX BLOOD GLUCOSE TEST VI)      insulin aspart (NOVOLOG) 100 UNIT/ML injection Inject 80 units max daily dose. Use with pump 30 mL 11   Insulin Disposable Pump (OMNIPOD 5 DEXG7G6 PODS GEN 5) MISC Use as directed and change pod every 3 days 30 each 3   mupirocin ointment (BACTROBAN) 2 % Apply 1 Application topically 2 (two) times daily. 22 g 0   nortriptyline (PAMELOR) 10 MG capsule Take 1 capsule (10 mg total) by mouth at bedtime. 30 capsule 5   ondansetron (ZOFRAN) 4 MG tablet Take 1 tablet (4 mg total) by mouth every 8 (eight) hours as needed for Nausea / Vomiting. 20 tablet 5   pantoprazole (PROTONIX) 40 MG tablet Take 40 mg by mouth daily.     predniSONE (DELTASONE) 5 MG tablet Take 5 mg by mouth daily with breakfast.     rizatriptan (MAXALT-MLT) 10 MG disintegrating tablet Take 1 tablet (10 mg total) by mouth as needed for migraine. May repeat in 2 hours if needed.  Maximum 2 tablets in 24 hours. 9 tablet 5   tacrolimus (PROGRAF) 1 MG capsule Take 1 mg by mouth 2 (two) times daily. 2 cap in the morning, and 2 cap at night     tacrolimus (PROGRAF) 1 MG capsule Take by mouth.     nortriptyline (PAMELOR) 25 MG capsule Take 1 capsule (25 mg total) by mouth at bedtime. (Patient not taking: Reported on 12/06/2023) 30 capsule 5   No current facility-administered medications on file prior to visit.    Allergies  Allergen Reactions   Garlic Nausea And Vomiting    Social History   Socioeconomic History   Marital status: Significant Other    Spouse name: Not on file   Number of children: Not on file   Years of education: Not on file   Highest education level: Bachelor's  degree (e.g., BA, AB, BS)  Occupational History   Not on file  Tobacco Use   Smoking status: Never   Smokeless tobacco: Never  Vaping Use   Vaping status: Never Used  Substance and Sexual Activity   Alcohol use: Yes    Comment: occasional    Drug use: No   Sexual activity: Never    Birth control/protection: Pill  Other Topics Concern   Not on file  Social History Narrative   ** Merged History Encounter **       Social Drivers of Health   Financial Resource Strain: Low Risk  (12/06/2023)   Overall Financial Resource Strain (CARDIA)    Difficulty of Paying Living Expenses: Not hard at all  Food Insecurity: No Food Insecurity (12/06/2023)  Hunger Vital Sign    Worried About Running Out of Food in the Last Year: Never true    Ran Out of Food in the Last Year: Never true  Transportation Needs: No Transportation Needs (12/06/2023)   PRAPARE - Administrator, Civil Service (Medical): No    Lack of Transportation (Non-Medical): No  Physical Activity: Sufficiently Active (12/06/2023)   Exercise Vital Sign    Days of Exercise per Week: 5 days    Minutes of Exercise per Session: 40 min  Stress: No Stress Concern Present (12/06/2023)   [employer] of Occupational Health - Occupational Stress Questionnaire    Feeling of Stress : Not at all  Social Connections: Socially Isolated (12/06/2023)   Social Connection and Isolation Panel [NHANES]    Frequency of Communication with Friends and Family: More than three times a week    Frequency of Social Gatherings with Friends and Family: Twice a week    Attends Religious Services: Never    Database administrator or Organizations: No    Attends Banker Meetings: Never    Marital Status: Never married  Intimate Partner Violence: Not At Risk (12/06/2023)   Humiliation, Afraid, Rape, and Kick questionnaire    Fear of Current or Ex-Partner: No    Emotionally Abused: No    Physically Abused: No    Sexually Abused: No     Family History  Problem Relation Age of Onset   Hypertension Mother    Stroke Mother    Hypertension Father    Cancer Maternal Grandfather        prostate   Breast cancer Cousin        maternal 1st cousin   Cancer Other    COPD Other    Hyperlipidemia Other     Past Surgical History:  Procedure Laterality Date   KIDNEY TRANSPLANT  06/2020   WISDOM TOOTH EXTRACTION      ROS: Review of Systems Negative except as stated above  PHYSICAL EXAM: BP (!) 166/104 (BP Location: Left Arm, Patient Position: Sitting, Cuff Size: Normal)   Pulse 99   Temp 98.2 F (36.8 C) (Oral)   Ht 5\' 2"  (1.575 m)   Wt 195 lb (88.5 kg)   SpO2 97%   BMI 35.67 kg/m   Wt Readings from Last 3 Encounters:  12/06/23 195 lb (88.5 kg)  11/25/23 196 lb (88.9 kg)  10/30/23 197 lb (89.4 kg)  BP 177/114  Physical Exam  General appearance - alert, well appearing, and in no distress Mental status - normal mood, behavior, speech, dress, motor activity, and thought processes Chest - clear to auscultation, no wheezes, rales or rhonchi, symmetric air entry Heart - normal rate, regular rhythm, normal S1, S2, no murmurs, rubs, clicks or gallops Extremities -right lower extremity without edema.  She is wearing a Velcro boot on the left lower leg from the upper shin down that includes the foot.  I have reviewed labs from [facility].  Her most recent GFRs were 43, 51 and 41.    Latest Ref Rng & Units 08/15/2019    9:35 AM 01/26/2019    9:16 AM 01/06/2019    3:52 PM  CMP  Glucose 70 - 99 mg/dL 161  096  045   BUN 6 - 20 mg/dL 55  33  40   Creatinine 0.44 - 1.00 mg/dL 4.09  8.11  9.14   Sodium 135 - 145 mmol/L 136  140  139  Potassium 3.5 - 5.1 mmol/L 5.4  5.0  4.6   Chloride 98 - 111 mmol/L 107  110  105   CO2 22 - 32 mmol/L 19  20  20    Calcium 8.9 - 10.3 mg/dL 9.2  9.5  9.4    Lipid Panel     Component Value Date/Time   CHOL 158 03/13/2023 1541   CHOL 227 (H) 05/27/2018 1602   TRIG 197.0  (H) 03/13/2023 1541   HDL 53.70 03/13/2023 1541   HDL 59 05/27/2018 1602   CHOLHDL 3 03/13/2023 1541   VLDL 39.4 03/13/2023 1541   LDLCALC 64 03/13/2023 1541   LDLCALC 140 (H) 05/27/2018 1602   LDLDIRECT 121.0 06/21/2021 1459    CBC    Component Value Date/Time   WBC 5.0 11/12/2018 1030   WBC 4.4 11/05/2018 0510   RBC 3.37 (L) 11/12/2018 1030   RBC 3.01 (L) 11/05/2018 0510   RBC 3.01 (L) 11/05/2018 0510   HGB 8.9 (L) 11/12/2018 1030   HGB 11.6 09/07/2011 1500   HCT 28.3 (L) 11/12/2018 1030   HCT 34.7 (L) 09/07/2011 1500   PLT 217 11/12/2018 1030   MCV 84 11/12/2018 1030   MCV 79.4 (L) 09/07/2011 1500   MCH 26.4 (L) 11/12/2018 1030   MCH 27.6 11/05/2018 0510   MCHC 31.4 (L) 11/12/2018 1030   MCHC 31.6 11/05/2018 0510   RDW 12.9 11/12/2018 1030   RDW 13.8 09/07/2011 1500   LYMPHSABS 1.6 11/12/2018 1030   LYMPHSABS 1.7 09/07/2011 1500   MONOABS 0.3 05/31/2018 1005   MONOABS 0.2 09/07/2011 1500   EOSABS 0.1 11/12/2018 1030   BASOSABS 0.0 11/12/2018 1030   BASOSABS 0.0 09/07/2011 1500    ASSESSMENT AND PLAN: 1. Type 1 diabetes mellitus with obesity (HCC) (Primary) Plugged in with endocrinology.  Has insulin pump. Encourage healthy eating habits. Try to move is much as her left foot will allow. - Microalbumin / creatinine urine ratio  2. Hypertension associated with type 1 diabetes mellitus (HCC) Not at goal but reported blood pressure readings at home are better than what we are getting today.  We have agreed to increase the amlodipine to 7.5 mg daily.  Flagged that there is potential for increased tacrolimus when used in combination with amlodipine.  However patient has been on the amlodipine the entire time that she has been on the tacrolimus.  I informed her of this nonetheless. - amLODipine (NORVASC) 5 MG tablet; Take 1.5 tablets (7.5 mg total) by mouth daily.  Dispense: 135 tablet; Refill: 1  3. Kidney transplant recipient Followed by transplant team with [facility].  She is on immunosuppressive medications listed above  4. Stage 3b chronic kidney disease (HCC) Most recent GFR of 41.  5. Need for influenza vaccination Agreeable to receiving flu and pneumonia vaccines today. - Flu vaccine trivalent PF, 6mos and older(Flulaval,Afluria,Fluarix,Fluzone)  6. Need for vaccination against Streptococcus pneumoniae - PNEUMOCOCCAL CONJUGATE VACCINE 15-VALENT    Patient was given the opportunity to ask questions.  Patient verbalized understanding of the plan and was able to repeat key elements of the plan.   This documentation was completed using Paediatric nurse.  Any transcriptional errors are unintentional.  Orders Placed This Encounter  Procedures   PNEUMOCOCCAL CONJUGATE VACCINE 15-VALENT   Flu vaccine trivalent PF, 6mos and older(Flulaval,Afluria,Fluarix,Fluzone)   Microalbumin / creatinine urine ratio     Requested Prescriptions   Signed Prescriptions Disp Refills   amLODipine (NORVASC) 5 MG tablet 135  tablet 1    Sig: Take 1.5 tablets (7.5 mg total) by mouth daily.    Return in about 5 weeks (around 01/10/2024) for PAP.  Jonah Blue, MD, FACP

## 2023-12-09 LAB — MICROALBUMIN / CREATININE URINE RATIO
Creatinine, Urine: 43.7 mg/dL
Microalb/Creat Ratio: 1697 mg/g{creat} — ABNORMAL HIGH (ref 0–29)
Microalbumin, Urine: 741.5 ug/mL

## 2023-12-11 ENCOUNTER — Telehealth: Payer: Self-pay | Admitting: Internal Medicine

## 2023-12-11 ENCOUNTER — Ambulatory Visit (INDEPENDENT_AMBULATORY_CARE_PROVIDER_SITE_OTHER): Admitting: Podiatry

## 2023-12-11 ENCOUNTER — Encounter: Payer: Self-pay | Admitting: Podiatry

## 2023-12-11 DIAGNOSIS — M722 Plantar fascial fibromatosis: Secondary | ICD-10-CM

## 2023-12-11 DIAGNOSIS — Z940 Kidney transplant status: Secondary | ICD-10-CM

## 2023-12-11 DIAGNOSIS — E1021 Type 1 diabetes mellitus with diabetic nephropathy: Secondary | ICD-10-CM

## 2023-12-11 DIAGNOSIS — M62462 Contracture of muscle, left lower leg: Secondary | ICD-10-CM

## 2023-12-11 NOTE — Telephone Encounter (Signed)
 Phone call placed to patient today to go over results of urine microalbumin.  This showed that patient had 1.6 g of protein in the urine which is significantly increased from 1 year ago.  I also see that patient had received a letter via Mychart from [place] stating that there was an area in calculating the amount of protein in the urine even though the urine microalbumin was not done through their office. -I had initially intended to have patient speak with transplant team at [facility] about the increased protein in the urine and may starting a low-dose of lisinopril or Cozaar.  However, given the letter that she received, just for clarification and accuracy, I recommend that we repeat the urine microalbumin first.  Patient expressed understanding of the plan and is agreeable to returning to the lab to have the urine microalbumin rechecked.  Advised that she can come to the lab at any time except between 12:30 and 1:30 PM during the week.

## 2023-12-11 NOTE — Progress Notes (Signed)
 Subjective:  Patient ID: Cathy Green, female    DOB: 1980/11/16,  MRN: [id number]  Chief Complaint  Patient presents with   Foot Pain    Pt stated that she has been having some pain and discomfort with her left foot she stated that she has not had any recent injuries she stated that in the mornings it is hard for her to walk     43 y.o. female presents with the above complaint.  Patient presents with left midfoot arch/plantar fasciitis pain painful to touch is progressive and worse worse with ambulation or shoe pressure patient would like to discuss treatment options for it pain scale 7 out of 10 dull aching nature.  She states this started to come back in her shoe for step in the morning.   Review of Systems: Negative except as noted in the HPI. Denies N/V/F/Ch.  Past Medical History:  Diagnosis Date   Abnormal Pap smear    Allergy    seasonal allergies   Anemia    Chronic kidney disease    Diabetes mellitus    diagnosed age 40   Diabetes mellitus without complication (HCC)    Glaucoma    Hyperlipidemia    Hypertension    Migraines    Noncompliance    Numbness in left leg     Current Outpatient Medications:    amLODipine (NORVASC) 5 MG tablet, Take 1.5 tablets (7.5 mg total) by mouth daily., Disp: 135 tablet, Rfl: 1   atorvastatin (LIPITOR) 20 MG tablet, Take 1 tablet (20 mg total) by mouth daily., Disp: 90 tablet, Rfl: 3   azaTHIOprine (IMURAN) 50 MG tablet, Take 100 mg by mouth daily., Disp: , Rfl:    Brinzolamide-Brimonidine (SIMBRINZA) 1-0.2 % SUSP, Instill 1 drop into both eyes twice a day, Disp: 8 mL, Rfl: 11   Continuous Glucose Sensor (DEXCOM G7 SENSOR) MISC, Change sensore every 10 days., Disp: 9 each, Rfl: 3   cyanocobalamin 1000 MCG tablet, Take by mouth., Disp: , Rfl:    fluticasone (FLONASE) 50 MCG/ACT nasal spray, Place 2 sprays into both nostrils daily., Disp: 16 g, Rfl: 6   gabapentin (NEURONTIN) 300 MG capsule, Take 2 capsules (600 mg total) by mouth 2  (two) times daily. Needs to be seen prior to next refill request., Disp: 360 capsule, Rfl: 3   glucose blood (CONTOUR NEXT TEST) test strip, Use as instructed to check blood sugar TID., Disp: 100 each, Rfl: 6   Glucose Blood (TRUE METRIX BLOOD GLUCOSE TEST VI), , Disp: , Rfl:    insulin aspart (NOVOLOG) 100 UNIT/ML injection, Inject 80 units max daily dose. Use with pump, Disp: 30 mL, Rfl: 11   Insulin Disposable Pump (OMNIPOD 5 DEXG7G6 PODS GEN 5) MISC, Use as directed and change pod every 3 days, Disp: 30 each, Rfl: 3   mupirocin ointment (BACTROBAN) 2 %, Apply 1 Application topically 2 (two) times daily., Disp: 22 g, Rfl: 0   nortriptyline (PAMELOR) 10 MG capsule, Take 1 capsule (10 mg total) by mouth at bedtime., Disp: 30 capsule, Rfl: 5   nortriptyline (PAMELOR) 25 MG capsule, Take 1 capsule (25 mg total) by mouth at bedtime. (Patient not taking: Reported on 12/06/2023), Disp: 30 capsule, Rfl: 5   ondansetron (ZOFRAN) 4 MG tablet, Take 1 tablet (4 mg total) by mouth every 8 (eight) hours as needed for Nausea / Vomiting., Disp: 20 tablet, Rfl: 5   pantoprazole (PROTONIX) 40 MG tablet, Take 40 mg by mouth daily., Disp: , Rfl:  predniSONE (DELTASONE) 5 MG tablet, Take 5 mg by mouth daily with breakfast., Disp: , Rfl:    rizatriptan (MAXALT-MLT) 10 MG disintegrating tablet, Take 1 tablet (10 mg total) by mouth as needed for migraine. May repeat in 2 hours if needed.  Maximum 2 tablets in 24 hours., Disp: 9 tablet, Rfl: 5   tacrolimus (PROGRAF) 1 MG capsule, Take 1 mg by mouth 2 (two) times daily. 2 cap in the morning, and 2 cap at night, Disp: , Rfl:    tacrolimus (PROGRAF) 1 MG capsule, Take by mouth., Disp: , Rfl:   Social History   Tobacco Use  Smoking Status Never  Smokeless Tobacco Never    Allergies  Allergen Reactions   Garlic Nausea And Vomiting   Objective:  There were no vitals filed for this visit. There is no height or weight on file to calculate BMI. Constitutional Well  developed. Well nourished.  Vascular Dorsalis pedis pulses palpable bilaterally. Posterior tibial pulses palpable bilaterally. Capillary refill normal to all digits.  No cyanosis or clubbing noted. Pedal hair growth normal.  Neurologic Normal speech. Oriented to person, place, and time. Epicritic sensation to light touch grossly present bilaterally.  Dermatologic Nails well groomed and normal in appearance. No open wounds. No skin lesions.  Orthopedic: Normal joint ROM without pain or crepitus bilaterally. No visible deformities. No tender to palpation at the calcaneal tuber left.  Pain on palpation to the left midfoot plantar fascia No pain with calcaneal squeeze left. Ankle ROM diminished range of motion left. Silfverskiold Test: positive left.   Radiographs: None  Assessment:   1. Plantar fasciitis, left    Plan:  Patient was evaluated and treated and all questions answered.  Plantar Fasciitis, left with underlying gastrocnemius equinus - XR reviewed as above.  - Educated on icing and stretching. Instructions given.  - Injection delivered to the plantar fascia as below. - DME: Plantar fascial brace dispensed to support the medial longitudinal arch of the foot and offload pressure from the heel and prevent arch collapse during weightbearing - Pharmacologic management: None  Procedure: Injection Tendon/Ligament Location: Left plantar fascia at the glabrous junction; medial approach. Skin Prep: alcohol Injectate: 0.5 cc 0.5% marcaine plain, 0.5 cc of 1% Lidocaine, 0.5 cc kenalog 10. Disposition: Patient tolerated procedure well. Injection site dressed with a band-aid.  No follow-ups on file.

## 2023-12-13 ENCOUNTER — Ambulatory Visit: Attending: Internal Medicine

## 2023-12-13 ENCOUNTER — Other Ambulatory Visit: Payer: Self-pay

## 2023-12-13 DIAGNOSIS — Z940 Kidney transplant status: Secondary | ICD-10-CM | POA: Diagnosis not present

## 2023-12-13 DIAGNOSIS — E1021 Type 1 diabetes mellitus with diabetic nephropathy: Secondary | ICD-10-CM

## 2023-12-15 ENCOUNTER — Encounter: Payer: Self-pay | Admitting: Internal Medicine

## 2023-12-15 LAB — MICROALBUMIN / CREATININE URINE RATIO
Creatinine, Urine: 85.5 mg/dL
Microalb/Creat Ratio: 1210 mg/g{creat} — ABNORMAL HIGH (ref 0–29)
Microalbumin, Urine: 1034.8 ug/mL

## 2023-12-19 ENCOUNTER — Other Ambulatory Visit: Payer: Self-pay

## 2023-12-19 ENCOUNTER — Other Ambulatory Visit: Payer: Self-pay | Admitting: Internal Medicine

## 2023-12-19 DIAGNOSIS — N1832 Chronic kidney disease, stage 3b: Secondary | ICD-10-CM

## 2023-12-19 MED ORDER — LOSARTAN POTASSIUM 25 MG PO TABS
25.0000 mg | ORAL_TABLET | Freq: Every day | ORAL | 6 refills | Status: DC
  Filled 2023-12-19: qty 30, 30d supply, fill #0
  Filled 2024-01-20: qty 30, 30d supply, fill #1
  Filled 2024-02-18: qty 30, 30d supply, fill #2

## 2023-12-20 ENCOUNTER — Other Ambulatory Visit: Payer: Self-pay

## 2023-12-26 ENCOUNTER — Other Ambulatory Visit: Payer: Self-pay

## 2023-12-30 ENCOUNTER — Other Ambulatory Visit: Payer: Self-pay

## 2024-01-08 ENCOUNTER — Ambulatory Visit (INDEPENDENT_AMBULATORY_CARE_PROVIDER_SITE_OTHER): Admitting: Podiatry

## 2024-01-08 DIAGNOSIS — M722 Plantar fascial fibromatosis: Secondary | ICD-10-CM | POA: Diagnosis not present

## 2024-01-08 DIAGNOSIS — M62462 Contracture of muscle, left lower leg: Secondary | ICD-10-CM

## 2024-01-08 NOTE — Progress Notes (Signed)
 Subjective:  Patient ID: Cathy Green, female    DOB: 07-Jun-1981,  MRN: [id number]  Chief Complaint  Patient presents with   Plantar Fasciitis    Pt stated that things are doing better than the last time     43 y.o. female presents with the above complaint.  Patient presents for follow-up of left midfoot plantar fasciitis she states is doing better denies any other acute complaints would like to discuss next treatment she still has some residual pain.  Pain scale is now 1 out of 10 dull aching nature   Review of Systems: Negative except as noted in the HPI. Denies N/V/F/Ch.  Past Medical History:  Diagnosis Date   Abnormal Pap smear    Allergy    seasonal allergies   Anemia    Chronic kidney disease    Diabetes mellitus    diagnosed age 60   Diabetes mellitus without complication (HCC)    Glaucoma    Hyperlipidemia    Hypertension    Migraines    Noncompliance    Numbness in left leg     Current Outpatient Medications:    amLODipine (NORVASC) 5 MG tablet, Take 1.5 tablets (7.5 mg total) by mouth daily., Disp: 135 tablet, Rfl: 1   atorvastatin (LIPITOR) 20 MG tablet, Take 1 tablet (20 mg total) by mouth daily., Disp: 90 tablet, Rfl: 3   azaTHIOprine (IMURAN) 50 MG tablet, Take 100 mg by mouth daily., Disp: , Rfl:    Brinzolamide-Brimonidine (SIMBRINZA) 1-0.2 % SUSP, Instill 1 drop into both eyes twice a day, Disp: 8 mL, Rfl: 11   Continuous Glucose Sensor (DEXCOM G7 SENSOR) MISC, Change sensore every 10 days., Disp: 9 each, Rfl: 3   cyanocobalamin 1000 MCG tablet, Take by mouth., Disp: , Rfl:    fluticasone (FLONASE) 50 MCG/ACT nasal spray, Place 2 sprays into both nostrils daily., Disp: 16 g, Rfl: 6   gabapentin (NEURONTIN) 300 MG capsule, Take 2 capsules (600 mg total) by mouth 2 (two) times daily. Needs to be seen prior to next refill request., Disp: 360 capsule, Rfl: 3   glucose blood (CONTOUR NEXT TEST) test strip, Use as instructed to check blood sugar TID., Disp: 100  each, Rfl: 6   Glucose Blood (TRUE METRIX BLOOD GLUCOSE TEST VI), , Disp: , Rfl:    insulin aspart (NOVOLOG) 100 UNIT/ML injection, Inject 80 units max daily dose. Use with pump, Disp: 30 mL, Rfl: 11   Insulin Disposable Pump (OMNIPOD 5 DEXG7G6 PODS GEN 5) MISC, Use as directed and change pod every 3 days, Disp: 30 each, Rfl: 3   losartan (COZAAR) 25 MG tablet, Take 1 tablet (25 mg total) by mouth daily., Disp: 30 tablet, Rfl: 6   mupirocin ointment (BACTROBAN) 2 %, Apply 1 Application topically 2 (two) times daily., Disp: 22 g, Rfl: 0   nortriptyline (PAMELOR) 10 MG capsule, Take 1 capsule (10 mg total) by mouth at bedtime., Disp: 30 capsule, Rfl: 5   nortriptyline (PAMELOR) 25 MG capsule, Take 1 capsule (25 mg total) by mouth at bedtime. (Patient not taking: Reported on 12/06/2023), Disp: 30 capsule, Rfl: 5   ondansetron (ZOFRAN) 4 MG tablet, Take 1 tablet (4 mg total) by mouth every 8 (eight) hours as needed for Nausea / Vomiting., Disp: 20 tablet, Rfl: 5   pantoprazole (PROTONIX) 40 MG tablet, Take 40 mg by mouth daily., Disp: , Rfl:    predniSONE (DELTASONE) 5 MG tablet, Take 5 mg by mouth daily with breakfast., Disp: , Rfl:  rizatriptan (MAXALT-MLT) 10 MG disintegrating tablet, Take 1 tablet (10 mg total) by mouth as needed for migraine. May repeat in 2 hours if needed.  Maximum 2 tablets in 24 hours., Disp: 9 tablet, Rfl: 5   tacrolimus (PROGRAF) 1 MG capsule, Take 1 mg by mouth 2 (two) times daily. 2 cap in the morning, and 2 cap at night, Disp: , Rfl:    tacrolimus (PROGRAF) 1 MG capsule, Take by mouth., Disp: , Rfl:   Social History   Tobacco Use  Smoking Status Never  Smokeless Tobacco Never    Allergies  Allergen Reactions   Garlic Nausea And Vomiting   Objective:  There were no vitals filed for this visit. There is no height or weight on file to calculate BMI. Constitutional Well developed. Well nourished.  Vascular Dorsalis pedis pulses palpable bilaterally. Posterior  tibial pulses palpable bilaterally. Capillary refill normal to all digits.  No cyanosis or clubbing noted. Pedal hair growth normal.  Neurologic Normal speech. Oriented to person, place, and time. Epicritic sensation to light touch grossly present bilaterally.  Dermatologic Nails well groomed and normal in appearance. No open wounds. No skin lesions.  Orthopedic: Normal joint ROM without pain or crepitus bilaterally. No visible deformities. No tender to palpation at the calcaneal tuber left.  Pain on palpation to the left midfoot plantar fascia No pain with calcaneal squeeze left. Ankle ROM diminished range of motion left. Silfverskiold Test: positive left.   Radiographs: None  Assessment:   No diagnosis found.  Plan:  Patient was evaluated and treated and all questions answered.  Plantar Fasciitis, left with underlying gastrocnemius equinus - XR reviewed as above.  - Educated on icing and stretching. Instructions given.  - second Injection delivered to the plantar fascia as below. - DME: Plantar fascial brace dispensed to support the medial longitudinal arch of the foot and offload pressure from the heel and prevent arch collapse during weightbearing - Pharmacologic management: None  Procedure: Injection Tendon/Ligament Location: Left plantar fascia at the glabrous junction; medial approach. Skin Prep: alcohol Injectate: 0.5 cc 0.5% marcaine plain, 0.5 cc of 1% Lidocaine, 0.5 cc kenalog 10. Disposition: Patient tolerated procedure well. Injection site dressed with a band-aid.  No follow-ups on file.

## 2024-01-10 ENCOUNTER — Telehealth: Payer: Self-pay

## 2024-01-10 NOTE — Telephone Encounter (Signed)
 Microalbumin / creatinine urine ratio  that was done on 06/02/21 was already repeat on 12/06/23 and 12/13/23 by PCP.  Dr. Jonah Blue

## 2024-01-14 DIAGNOSIS — Z79899 Other long term (current) drug therapy: Secondary | ICD-10-CM | POA: Diagnosis not present

## 2024-01-14 DIAGNOSIS — Z79621 Long term (current) use of calcineurin inhibitor: Secondary | ICD-10-CM | POA: Diagnosis not present

## 2024-01-14 DIAGNOSIS — D849 Immunodeficiency, unspecified: Secondary | ICD-10-CM | POA: Diagnosis not present

## 2024-01-14 DIAGNOSIS — Z940 Kidney transplant status: Secondary | ICD-10-CM | POA: Diagnosis not present

## 2024-01-14 DIAGNOSIS — B259 Cytomegaloviral disease, unspecified: Secondary | ICD-10-CM | POA: Diagnosis not present

## 2024-01-14 DIAGNOSIS — E1021 Type 1 diabetes mellitus with diabetic nephropathy: Secondary | ICD-10-CM | POA: Diagnosis not present

## 2024-01-14 DIAGNOSIS — Z5181 Encounter for therapeutic drug level monitoring: Secondary | ICD-10-CM | POA: Diagnosis not present

## 2024-01-16 ENCOUNTER — Ambulatory Visit: Attending: Internal Medicine | Admitting: Internal Medicine

## 2024-01-16 ENCOUNTER — Encounter: Payer: Self-pay | Admitting: Internal Medicine

## 2024-01-16 ENCOUNTER — Other Ambulatory Visit (HOSPITAL_COMMUNITY)
Admission: RE | Admit: 2024-01-16 | Discharge: 2024-01-16 | Disposition: A | Discharge status: Home / Self Care | Source: Ambulatory Visit | Attending: Internal Medicine | Admitting: Internal Medicine

## 2024-01-16 VITALS — BP 132/85 | HR 100 | Temp 98.3°F | Ht 62.0 in | Wt 194.0 lb

## 2024-01-16 DIAGNOSIS — E1059 Type 1 diabetes mellitus with other circulatory complications: Secondary | ICD-10-CM

## 2024-01-16 DIAGNOSIS — Z124 Encounter for screening for malignant neoplasm of cervix: Secondary | ICD-10-CM | POA: Diagnosis not present

## 2024-01-16 DIAGNOSIS — Z1231 Encounter for screening mammogram for malignant neoplasm of breast: Secondary | ICD-10-CM | POA: Diagnosis not present

## 2024-01-16 DIAGNOSIS — I152 Hypertension secondary to endocrine disorders: Secondary | ICD-10-CM | POA: Diagnosis not present

## 2024-01-16 NOTE — Progress Notes (Signed)
 Patient ID: Cathy Green, female    DOB: [birth year]  MRN: [id number]  CC: Gynecologic Exam (Pap. /No questions / concerns/)   Subjective: Cathy Green is a 43 y.o. 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   GYN History:  Pt is G0P0 Any hx of abn paps?: yes Menses regular or irregular?:  regular How long does menses last? 5 days Menstrual flow light or heavy?: moderate Method of birth control?:  no Any vaginal dischg at this time?: no Dysuria?: no Any hx of STI?: no Sexually active with how many partners: one female Desires STI screen:  yes Last MMG: 08/2022 Family hx of uterine, cervical or breast cancer?:  breast CA in first cousin.  HTN:  BP elev today. Took her meds Norvasc 7.5 mg and Cozaar 25 mg daily already for today.  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) 11/14/2020   Type 1 diabetes mellitus with stage 3b chronic kidney disease (HCC) 11/14/2020   Hyperlipidemia due to type 1 diabetes mellitus (HCC) 08/02/2020   Hypertensive kidney disease 08/02/2020   Kidney transplant recipient 08/02/2020   Hypoglycemia due to insulin 02/12/2020   CKD stage 5 due to type 1 diabetes mellitus (HCC) 09/13/2019   Migraine without aura and without status migrainosus, not intractable 06/22/2019   Chest pain 11/04/2018   Hyperkalemia 11/04/2018    Diabetic retinopathy (HCC) 04/26/2017   Proteinuria 11/01/2015   Normocytic anemia 09/07/2011   Hyperlipidemia 09/07/2011   Hypertension 09/07/2011     Current Outpatient Medications on File Prior to Visit  Medication Sig Dispense Refill   amLODipine (NORVASC) 5 MG tablet Take 1.5 tablets (7.5 mg total) by mouth daily. 135 tablet 1   atorvastatin (LIPITOR) 20 MG tablet Take 1 tablet (20 mg total) by mouth daily. 90 tablet 3   azaTHIOprine (IMURAN) 50 MG tablet Take 100 mg by mouth daily.     Brinzolamide-Brimonidine (SIMBRINZA) 1-0.2 % SUSP Instill 1 drop into both eyes twice a day 8 mL 11   Continuous Glucose Sensor (DEXCOM G7 SENSOR) MISC Change sensore every 10 days. 9 each 3   cyanocobalamin 1000 MCG tablet Take by mouth.     fluticasone (FLONASE) 50 MCG/ACT nasal spray Place 2 sprays into both nostrils daily. 16 g 6   gabapentin (NEURONTIN) 300 MG capsule Take 2 capsules (600 mg total) by mouth 2 (two) times daily. Needs to be seen prior to next refill request. 360 capsule 3   glucose blood (CONTOUR NEXT TEST) test strip Use as instructed to check blood sugar TID. 100 each 6   Glucose Blood (TRUE METRIX BLOOD GLUCOSE TEST VI)      insulin aspart (NOVOLOG) 100 UNIT/ML injection Inject 80 units max daily dose. Use with pump 30 mL 11   Insulin Disposable Pump (OMNIPOD 5 DEXG7G6 PODS GEN 5) MISC Use as directed and change pod every 3  days 30 each 3   losartan (COZAAR) 25 MG tablet Take 1 tablet (25 mg total) by mouth daily. 30 tablet 6   mupirocin ointment (BACTROBAN) 2 % Apply 1 Application topically 2 (two) times daily. 22 g 0   nortriptyline (PAMELOR) 10 MG capsule Take 1 capsule (10 mg total) by mouth at bedtime. 30 capsule 5   nortriptyline (PAMELOR) 25 MG capsule Take 1 capsule (25 mg total) by mouth at bedtime. 30 capsule 5   ondansetron (ZOFRAN) 4 MG tablet Take 1 tablet (4 mg total) by mouth every 8 (eight) hours as needed for Nausea / Vomiting. 20 tablet 5   pantoprazole  (PROTONIX) 40 MG tablet Take 40 mg by mouth daily.     predniSONE (DELTASONE) 5 MG tablet Take 5 mg by mouth daily with breakfast.     rizatriptan (MAXALT-MLT) 10 MG disintegrating tablet Take 1 tablet (10 mg total) by mouth as needed for migraine. May repeat in 2 hours if needed.  Maximum 2 tablets in 24 hours. 9 tablet 5   tacrolimus (PROGRAF) 1 MG capsule Take 1 mg by mouth 2 (two) times daily. 2 cap in the morning, and 2 cap at night     tacrolimus (PROGRAF) 1 MG capsule Take by mouth.     No current facility-administered medications on file prior to visit.    Allergies  Allergen Reactions   Garlic Nausea And Vomiting    Social History   Socioeconomic History   Marital status: Significant Other    Spouse name: Not on file   Number of children: Not on file   Years of education: Not on file   Highest education level: Bachelor's degree (e.g., BA, AB, BS)  Occupational History   Not on file  Tobacco Use   Smoking status: Never   Smokeless tobacco: Never  Vaping Use   Vaping status: Never Used  Substance and Sexual Activity   Alcohol use: Yes    Comment: occasional    Drug use: No   Sexual activity: Never    Birth control/protection: Pill  Other Topics Concern   Not on file  Social History Narrative   ** Merged History Encounter **       Social Drivers of Health   Financial Resource Strain: Low Risk  (12/06/2023)   Overall Financial Resource Strain (CARDIA)    Difficulty of Paying Living Expenses: Not hard at all  Food Insecurity: No Food Insecurity (12/06/2023)   Hunger Vital Sign    Worried About Running Out of Food in the Last Year: Never true    Ran Out of Food in the Last Year: Never true  Transportation Needs: No Transportation Needs (12/06/2023)   PRAPARE - Administrator, Civil Service (Medical): No    Lack of Transportation (Non-Medical): No  Physical Activity: Sufficiently Active (12/06/2023)   Exercise Vital Sign    Days of Exercise per Week: 5  days    Minutes of Exercise per Session: 40 min  Stress: No Stress Concern Present (12/06/2023)   [employer] of Occupational Health - Occupational Stress Questionnaire    Feeling of Stress : Not at all  Social Connections: Socially Isolated (12/06/2023)   Social Connection and Isolation Panel [NHANES]    Frequency of Communication with Friends and Family: More than three times a week    Frequency of Social Gatherings with Friends and Family: Twice a week    Attends Religious Services: Never    Database administrator or  Organizations: No    Attends Banker Meetings: Never    Marital Status: Never married  Intimate Partner Violence: Not At Risk (12/06/2023)   Humiliation, Afraid, Rape, and Kick questionnaire    Fear of Current or Ex-Partner: No    Emotionally Abused: No    Physically Abused: No    Sexually Abused: No    Family History  Problem Relation Age of Onset   Hypertension Mother    Stroke Mother    Hypertension Father    Cancer Maternal Grandfather        prostate   Breast cancer Cousin        maternal 1st cousin   Cancer Other    COPD Other    Hyperlipidemia Other     Past Surgical History:  Procedure Laterality Date   KIDNEY TRANSPLANT  06/2020   WISDOM TOOTH EXTRACTION      ROS: Review of Systems Negative except as stated above  PHYSICAL EXAM: BP 132/85   Pulse 100   Temp 98.3 F (36.8 C) (Oral)   Ht 5\' 2"  (1.575 m)   Wt 194 lb (88 kg)   SpO2 97%   BMI 35.48 kg/m   Physical Exam  General appearance - alert, well appearing, and in no distress Mental status - normal mood, behavior, speech, dress, motor activity, and thought processes Pelvic - CMA Clarisa present: normal external genitalia, vulva, vagina, cervix, uterus and adnexa      Latest Ref Rng & Units 11/25/2023   12:00 AM 08/15/2019    9:35 AM 01/26/2019    9:16 AM  CMP  Glucose 70 - 99 mg/dL  161  096   BUN 6 - 20 mg/dL  55  33   Creatinine 0.5 - 1.1 1.6     4.65  3.80    Sodium 135 - 145 mmol/L  136  140   Potassium 3.5 - 5.1 mmol/L  5.4  5.0   Chloride 98 - 111 mmol/L  107  110   CO2 22 - 32 mmol/L  19  20   Calcium 8.9 - 10.3 mg/dL  9.2  9.5      This result is from an external source.   Lipid Panel     Component Value Date/Time   CHOL 158 03/13/2023 1541   CHOL 227 (H) 05/27/2018 1602   TRIG 197.0 (H) 03/13/2023 1541   HDL 53.70 03/13/2023 1541   HDL 59 05/27/2018 1602   CHOLHDL 3 03/13/2023 1541   VLDL 39.4 03/13/2023 1541   LDLCALC 64 03/13/2023 1541   LDLCALC 140 (H) 05/27/2018 1602   LDLDIRECT 121.0 06/21/2021 1459    CBC    Component Value Date/Time   WBC 5.0 11/12/2018 1030   WBC 4.4 11/05/2018 0510   RBC 3.37 (L) 11/12/2018 1030   RBC 3.01 (L) 11/05/2018 0510   RBC 3.01 (L) 11/05/2018 0510   HGB 8.9 (L) 11/12/2018 1030   HGB 11.6 09/07/2011 1500   HCT 28.3 (L) 11/12/2018 1030   HCT 34.7 (L) 09/07/2011 1500   PLT 217 11/12/2018 1030   MCV 84 11/12/2018 1030   MCV 79.4 (L) 09/07/2011 1500   MCH 26.4 (L) 11/12/2018 1030   MCH 27.6 11/05/2018 0510   MCHC 31.4 (L) 11/12/2018 1030   MCHC 31.6 11/05/2018 0510   RDW 12.9 11/12/2018 1030   RDW 13.8 09/07/2011 1500   LYMPHSABS 1.6 11/12/2018 1030   LYMPHSABS 1.7 09/07/2011 1500   MONOABS 0.3 05/31/2018 1005  MONOABS 0.2 09/07/2011 1500   EOSABS 0.1 11/12/2018 1030   BASOSABS 0.0 11/12/2018 1030   BASOSABS 0.0 09/07/2011 1500    ASSESSMENT AND PLAN: 1. Pap smear for cervical cancer screening (Primary) - Cervicovaginal ancillary only - Cytology - PAP  2. Encounter for screening mammogram for malignant neoplasm of breast - MM 3D SCREENING MAMMOGRAM BILATERAL BREAST; Future  3. Hypertension associated with type 1 diabetes mellitus (HCC) Repeat blood pressure better.  She reports blood pressure readings at home that are closer to goal.  She will continue Cozaar 25 mg daily and Norvasc 7.5 mg once a day.   Patient was given the opportunity to ask questions.  Patient  verbalized understanding of the plan and was able to repeat key elements of the plan.   This documentation was completed using Paediatric nurse.  Any transcriptional errors are unintentional.  Orders Placed This Encounter  Procedures   MM 3D SCREENING MAMMOGRAM BILATERAL BREAST     Requested Prescriptions    No prescriptions requested or ordered in this encounter    Return in about 4 months (around 05/17/2024).  Concetta Dee, MD, FACP

## 2024-01-17 LAB — CERVICOVAGINAL ANCILLARY ONLY
Bacterial Vaginitis (gardnerella): POSITIVE — AB
Candida Glabrata: NEGATIVE
Candida Vaginitis: NEGATIVE
Chlamydia: NEGATIVE
Comment: NEGATIVE
Comment: NEGATIVE
Comment: NEGATIVE
Comment: NEGATIVE
Comment: NEGATIVE
Comment: NORMAL
Neisseria Gonorrhea: NEGATIVE
Trichomonas: NEGATIVE

## 2024-01-19 ENCOUNTER — Other Ambulatory Visit: Payer: Self-pay | Admitting: Internal Medicine

## 2024-01-19 ENCOUNTER — Encounter: Payer: Self-pay | Admitting: Internal Medicine

## 2024-01-19 MED ORDER — METRONIDAZOLE 500 MG PO TABS
500.0000 mg | ORAL_TABLET | Freq: Two times a day (BID) | ORAL | 0 refills | Status: DC
  Filled 2024-01-19: qty 12, 6d supply, fill #0

## 2024-01-20 ENCOUNTER — Other Ambulatory Visit: Payer: Self-pay

## 2024-01-21 ENCOUNTER — Other Ambulatory Visit: Payer: Self-pay

## 2024-01-21 ENCOUNTER — Ambulatory Visit
Admission: RE | Admit: 2024-01-21 | Discharge: 2024-01-21 | Disposition: A | Discharge status: Home / Self Care | Source: Ambulatory Visit | Attending: Internal Medicine | Admitting: Internal Medicine

## 2024-01-21 DIAGNOSIS — E103413 Type 1 diabetes mellitus with severe nonproliferative diabetic retinopathy with macular edema, bilateral: Secondary | ICD-10-CM | POA: Diagnosis not present

## 2024-01-21 DIAGNOSIS — E1036 Type 1 diabetes mellitus with diabetic cataract: Secondary | ICD-10-CM | POA: Diagnosis not present

## 2024-01-21 DIAGNOSIS — H2513 Age-related nuclear cataract, bilateral: Secondary | ICD-10-CM | POA: Diagnosis not present

## 2024-01-21 DIAGNOSIS — Z1231 Encounter for screening mammogram for malignant neoplasm of breast: Secondary | ICD-10-CM | POA: Diagnosis not present

## 2024-01-22 LAB — CYTOLOGY - PAP
Comment: NEGATIVE
Comment: NEGATIVE
Comment: NEGATIVE
HPV 16: NEGATIVE
HPV 18 / 45: NEGATIVE
High risk HPV: POSITIVE — AB

## 2024-01-23 ENCOUNTER — Encounter: Payer: Self-pay | Admitting: Internal Medicine

## 2024-01-23 ENCOUNTER — Other Ambulatory Visit: Payer: Self-pay | Admitting: Internal Medicine

## 2024-01-23 DIAGNOSIS — R87612 Low grade squamous intraepithelial lesion on cytologic smear of cervix (LGSIL): Secondary | ICD-10-CM

## 2024-01-23 DIAGNOSIS — R87618 Other abnormal cytological findings on specimens from cervix uteri: Secondary | ICD-10-CM

## 2024-01-25 ENCOUNTER — Encounter: Payer: Self-pay | Admitting: Internal Medicine

## 2024-01-29 ENCOUNTER — Other Ambulatory Visit: Payer: Self-pay

## 2024-02-19 ENCOUNTER — Other Ambulatory Visit: Payer: Self-pay

## 2024-02-19 ENCOUNTER — Other Ambulatory Visit (HOSPITAL_COMMUNITY): Payer: Self-pay

## 2024-02-19 ENCOUNTER — Telehealth: Payer: Self-pay

## 2024-02-19 NOTE — Telephone Encounter (Signed)
 Pharmacy Patient Advocate Encounter   Received notification from CoverMyMeds that prior authorization for Dexcom G7 Sensor is required/requested.   Insurance verification completed.   The patient is insured through [facility] .   Per test claim: PA required; PA submitted to above mentioned insurance via CoverMyMeds Key/confirmation #/EOC [id number] Status is pending

## 2024-02-20 ENCOUNTER — Other Ambulatory Visit: Payer: Self-pay

## 2024-02-20 NOTE — Telephone Encounter (Signed)
 Pharmacy Patient Advocate Encounter  Received notification from [facility] that Prior Authorization for Dexcom G7 Sensor has been APPROVED from 02-19-2024 to 02-18-2025   PA #/Case ID/Reference #: [facility]

## 2024-02-22 ENCOUNTER — Other Ambulatory Visit: Payer: Self-pay

## 2024-02-25 ENCOUNTER — Other Ambulatory Visit: Payer: Self-pay

## 2024-02-27 ENCOUNTER — Other Ambulatory Visit: Payer: Self-pay

## 2024-02-27 DIAGNOSIS — I129 Hypertensive chronic kidney disease with stage 1 through stage 4 chronic kidney disease, or unspecified chronic kidney disease: Secondary | ICD-10-CM | POA: Diagnosis not present

## 2024-02-27 DIAGNOSIS — Z79899 Other long term (current) drug therapy: Secondary | ICD-10-CM | POA: Diagnosis not present

## 2024-02-27 DIAGNOSIS — B259 Cytomegaloviral disease, unspecified: Secondary | ICD-10-CM | POA: Diagnosis not present

## 2024-02-27 DIAGNOSIS — D84821 Immunodeficiency due to drugs: Secondary | ICD-10-CM | POA: Diagnosis not present

## 2024-02-27 DIAGNOSIS — E1022 Type 1 diabetes mellitus with diabetic chronic kidney disease: Secondary | ICD-10-CM | POA: Diagnosis not present

## 2024-02-27 DIAGNOSIS — N183 Chronic kidney disease, stage 3 unspecified: Secondary | ICD-10-CM | POA: Diagnosis not present

## 2024-02-27 DIAGNOSIS — Z940 Kidney transplant status: Secondary | ICD-10-CM | POA: Diagnosis not present

## 2024-02-27 DIAGNOSIS — I10 Essential (primary) hypertension: Secondary | ICD-10-CM | POA: Diagnosis not present

## 2024-02-27 DIAGNOSIS — D649 Anemia, unspecified: Secondary | ICD-10-CM | POA: Diagnosis not present

## 2024-02-27 DIAGNOSIS — Z9641 Presence of insulin pump (external) (internal): Secondary | ICD-10-CM | POA: Diagnosis not present

## 2024-02-27 DIAGNOSIS — Z79621 Long term (current) use of calcineurin inhibitor: Secondary | ICD-10-CM | POA: Diagnosis not present

## 2024-02-27 MED ORDER — VITAMIN D (ERGOCALCIFEROL) 1.25 MG (50000 UNIT) PO CAPS
50000.0000 [IU] | ORAL_CAPSULE | ORAL | 0 refills | Status: DC
  Filled 2024-02-27: qty 6, 42d supply, fill #0

## 2024-03-04 ENCOUNTER — Other Ambulatory Visit: Payer: Self-pay

## 2024-03-04 ENCOUNTER — Encounter: Payer: Self-pay | Admitting: Internal Medicine

## 2024-03-04 ENCOUNTER — Other Ambulatory Visit: Payer: Self-pay | Admitting: Nurse Practitioner

## 2024-03-04 DIAGNOSIS — R053 Chronic cough: Secondary | ICD-10-CM

## 2024-03-04 MED ORDER — BENZONATATE 200 MG PO CAPS
200.0000 mg | ORAL_CAPSULE | Freq: Two times a day (BID) | ORAL | 0 refills | Status: AC | PRN
  Filled 2024-03-04: qty 30, 15d supply, fill #0

## 2024-03-10 ENCOUNTER — Encounter (HOSPITAL_COMMUNITY): Payer: Self-pay

## 2024-03-10 ENCOUNTER — Inpatient Hospital Stay (HOSPITAL_COMMUNITY)
Admission: EM | Admit: 2024-03-10 | Discharge: 2024-03-13 | DRG: 194 | Disposition: A | Discharge status: Home / Self Care | Attending: Internal Medicine | Admitting: Internal Medicine

## 2024-03-10 ENCOUNTER — Emergency Department (HOSPITAL_COMMUNITY)

## 2024-03-10 DIAGNOSIS — Z79621 Long term (current) use of calcineurin inhibitor: Secondary | ICD-10-CM | POA: Diagnosis not present

## 2024-03-10 DIAGNOSIS — D849 Immunodeficiency, unspecified: Secondary | ICD-10-CM | POA: Diagnosis not present

## 2024-03-10 DIAGNOSIS — K219 Gastro-esophageal reflux disease without esophagitis: Secondary | ICD-10-CM | POA: Diagnosis present

## 2024-03-10 DIAGNOSIS — E7849 Other hyperlipidemia: Secondary | ICD-10-CM

## 2024-03-10 DIAGNOSIS — E785 Hyperlipidemia, unspecified: Secondary | ICD-10-CM | POA: Diagnosis present

## 2024-03-10 DIAGNOSIS — E10319 Type 1 diabetes mellitus with unspecified diabetic retinopathy without macular edema: Secondary | ICD-10-CM | POA: Diagnosis present

## 2024-03-10 DIAGNOSIS — R1011 Right upper quadrant pain: Secondary | ICD-10-CM | POA: Diagnosis not present

## 2024-03-10 DIAGNOSIS — Z4822 Encounter for aftercare following kidney transplant: Secondary | ICD-10-CM

## 2024-03-10 DIAGNOSIS — D631 Anemia in chronic kidney disease: Secondary | ICD-10-CM | POA: Diagnosis not present

## 2024-03-10 DIAGNOSIS — J189 Pneumonia, unspecified organism: Secondary | ICD-10-CM | POA: Diagnosis not present

## 2024-03-10 DIAGNOSIS — Z3202 Encounter for pregnancy test, result negative: Secondary | ICD-10-CM | POA: Diagnosis not present

## 2024-03-10 DIAGNOSIS — E11319 Type 2 diabetes mellitus with unspecified diabetic retinopathy without macular edema: Secondary | ICD-10-CM | POA: Diagnosis present

## 2024-03-10 DIAGNOSIS — E875 Hyperkalemia: Secondary | ICD-10-CM | POA: Diagnosis not present

## 2024-03-10 DIAGNOSIS — I129 Hypertensive chronic kidney disease with stage 1 through stage 4 chronic kidney disease, or unspecified chronic kidney disease: Secondary | ICD-10-CM | POA: Diagnosis present

## 2024-03-10 DIAGNOSIS — E1022 Type 1 diabetes mellitus with diabetic chronic kidney disease: Secondary | ICD-10-CM | POA: Diagnosis not present

## 2024-03-10 DIAGNOSIS — Z6837 Body mass index (BMI) 37.0-37.9, adult: Secondary | ICD-10-CM

## 2024-03-10 DIAGNOSIS — Z940 Kidney transplant status: Secondary | ICD-10-CM | POA: Diagnosis not present

## 2024-03-10 DIAGNOSIS — Z9641 Presence of insulin pump (external) (internal): Secondary | ICD-10-CM | POA: Diagnosis present

## 2024-03-10 DIAGNOSIS — E1040 Type 1 diabetes mellitus with diabetic neuropathy, unspecified: Secondary | ICD-10-CM | POA: Diagnosis present

## 2024-03-10 DIAGNOSIS — R000 Tachycardia, unspecified: Secondary | ICD-10-CM

## 2024-03-10 DIAGNOSIS — Z79899 Other long term (current) drug therapy: Secondary | ICD-10-CM | POA: Diagnosis not present

## 2024-03-10 DIAGNOSIS — Z794 Long term (current) use of insulin: Secondary | ICD-10-CM

## 2024-03-10 DIAGNOSIS — Z8249 Family history of ischemic heart disease and other diseases of the circulatory system: Secondary | ICD-10-CM | POA: Diagnosis not present

## 2024-03-10 DIAGNOSIS — Z823 Family history of stroke: Secondary | ICD-10-CM | POA: Diagnosis not present

## 2024-03-10 DIAGNOSIS — Z825 Family history of asthma and other chronic lower respiratory diseases: Secondary | ICD-10-CM | POA: Diagnosis not present

## 2024-03-10 DIAGNOSIS — E109 Type 1 diabetes mellitus without complications: Secondary | ICD-10-CM | POA: Diagnosis present

## 2024-03-10 DIAGNOSIS — N183 Chronic kidney disease, stage 3 unspecified: Secondary | ICD-10-CM | POA: Diagnosis not present

## 2024-03-10 DIAGNOSIS — R918 Other nonspecific abnormal finding of lung field: Secondary | ICD-10-CM | POA: Diagnosis not present

## 2024-03-10 DIAGNOSIS — E66811 Obesity, class 1: Secondary | ICD-10-CM | POA: Diagnosis not present

## 2024-03-10 DIAGNOSIS — N1832 Chronic kidney disease, stage 3b: Secondary | ICD-10-CM | POA: Diagnosis present

## 2024-03-10 DIAGNOSIS — E1140 Type 2 diabetes mellitus with diabetic neuropathy, unspecified: Secondary | ICD-10-CM | POA: Insufficient documentation

## 2024-03-10 DIAGNOSIS — H409 Unspecified glaucoma: Secondary | ICD-10-CM | POA: Diagnosis present

## 2024-03-10 DIAGNOSIS — E103413 Type 1 diabetes mellitus with severe nonproliferative diabetic retinopathy with macular edema, bilateral: Secondary | ICD-10-CM

## 2024-03-10 DIAGNOSIS — R059 Cough, unspecified: Secondary | ICD-10-CM | POA: Diagnosis not present

## 2024-03-10 LAB — COMPREHENSIVE METABOLIC PANEL WITH GFR
ALT: 52 U/L — ABNORMAL HIGH (ref 0–44)
AST: 26 U/L (ref 15–41)
Albumin: 3.2 g/dL — ABNORMAL LOW (ref 3.5–5.0)
Alkaline Phosphatase: 80 U/L (ref 38–126)
Anion gap: 8 (ref 5–15)
BUN: 26 mg/dL — ABNORMAL HIGH (ref 6–20)
CO2: 22 mmol/L (ref 22–32)
Calcium: 9 mg/dL (ref 8.9–10.3)
Chloride: 107 mmol/L (ref 98–111)
Creatinine, Ser: 1.84 mg/dL — ABNORMAL HIGH (ref 0.44–1.00)
GFR, Estimated: 35 mL/min — ABNORMAL LOW (ref >60)
Glucose, Bld: 181 mg/dL — ABNORMAL HIGH (ref 70–99)
Potassium: 5.4 mmol/L — ABNORMAL HIGH (ref 3.5–5.1)
Sodium: 137 mmol/L (ref 135–145)
Total Bilirubin: 0.6 mg/dL (ref 0.0–1.2)
Total Protein: 7.3 g/dL (ref 6.5–8.1)

## 2024-03-10 LAB — URINALYSIS, ROUTINE W REFLEX MICROSCOPIC
Bilirubin Urine: NEGATIVE
Glucose, UA: 50 mg/dL — AB
Hgb urine dipstick: NEGATIVE
Ketones, ur: NEGATIVE mg/dL
Nitrite: NEGATIVE
Protein, ur: 100 mg/dL — AB
Specific Gravity, Urine: 1.012 (ref 1.005–1.030)
pH: 7 (ref 5.0–8.0)

## 2024-03-10 LAB — CBC WITH DIFFERENTIAL/PLATELET
Abs Immature Granulocytes: 0.03 10*3/uL (ref 0.00–0.07)
Basophils Absolute: 0 10*3/uL (ref 0.0–0.1)
Basophils Relative: 0 %
Eosinophils Absolute: 0 10*3/uL (ref 0.0–0.5)
Eosinophils Relative: 1 %
HCT: 37.1 % (ref 36.0–46.0)
Hemoglobin: 11.2 g/dL — ABNORMAL LOW (ref 12.0–15.0)
Immature Granulocytes: 1 %
Lymphocytes Relative: 21 %
Lymphs Abs: 1.3 10*3/uL (ref 0.7–4.0)
MCH: 26.4 pg (ref 26.0–34.0)
MCHC: 30.2 g/dL (ref 30.0–36.0)
MCV: 87.5 fL (ref 80.0–100.0)
Monocytes Absolute: 0.5 10*3/uL (ref 0.1–1.0)
Monocytes Relative: 8 %
Neutro Abs: 4.5 10*3/uL (ref 1.7–7.7)
Neutrophils Relative %: 69 %
Platelets: 264 10*3/uL (ref 150–400)
RBC: 4.24 MIL/uL (ref 3.87–5.11)
RDW: 17.7 % — ABNORMAL HIGH (ref 11.5–15.5)
WBC: 6.4 10*3/uL (ref 4.0–10.5)
nRBC: 0 % (ref 0.0–0.2)

## 2024-03-10 LAB — HCG, SERUM, QUALITATIVE: Preg, Serum: NEGATIVE

## 2024-03-10 LAB — GLUCOSE, CAPILLARY: Glucose-Capillary: 189 mg/dL — ABNORMAL HIGH (ref 70–99)

## 2024-03-10 LAB — LIPASE, BLOOD: Lipase: 45 U/L (ref 11–51)

## 2024-03-10 MED ORDER — LOSARTAN POTASSIUM 25 MG PO TABS: 25.0000 mg | ORAL_TABLET | Freq: Every day | ORAL | Status: DC

## 2024-03-10 MED ORDER — PREDNISONE 5 MG PO TABS
5.0000 mg | ORAL_TABLET | Freq: Every day | ORAL | Status: DC
  Administered 2024-03-11 – 2024-03-13 (×3): 5 mg via ORAL
  Filled 2024-03-10 (×3): qty 1

## 2024-03-10 MED ORDER — ONDANSETRON HCL 4 MG PO TABS
4.0000 mg | ORAL_TABLET | Freq: Four times a day (QID) | ORAL | Status: AC | PRN
Start: 2024-03-10 — End: ?

## 2024-03-10 MED ORDER — SODIUM CHLORIDE 0.9 % IV SOLN
2.0000 g | INTRAVENOUS | Status: DC
  Administered 2024-03-11 – 2024-03-12 (×3): 2 g via INTRAVENOUS
  Filled 2024-03-10 (×4): qty 20

## 2024-03-10 MED ORDER — ACETAMINOPHEN 650 MG RE SUPP
650.0000 mg | Freq: Four times a day (QID) | RECTAL | Status: AC | PRN
Start: 2024-03-10 — End: ?

## 2024-03-10 MED ORDER — INSULIN ASPART 100 UNIT/ML IJ SOLN: 0.0000 [IU] | Freq: Four times a day (QID) | INTRAMUSCULAR | Status: DC

## 2024-03-10 MED ORDER — HEPARIN SODIUM (PORCINE) 5000 UNIT/ML IJ SOLN
5000.0000 [IU] | Freq: Three times a day (TID) | INTRAMUSCULAR | Status: DC
  Administered 2024-03-10 – 2024-03-13 (×8): 5000 [IU] via SUBCUTANEOUS
  Filled 2024-03-10 (×8): qty 1

## 2024-03-10 MED ORDER — AMLODIPINE BESYLATE 5 MG PO TABS
7.5000 mg | ORAL_TABLET | Freq: Every day | ORAL | Status: DC
  Administered 2024-03-11 – 2024-03-13 (×3): 7.5 mg via ORAL
  Filled 2024-03-10 (×3): qty 2

## 2024-03-10 MED ORDER — IPRATROPIUM-ALBUTEROL 0.5-2.5 (3) MG/3ML IN SOLN
3.0000 mL | Freq: Once | RESPIRATORY_TRACT | Status: AC
  Administered 2024-03-10: 3 mL via RESPIRATORY_TRACT
  Filled 2024-03-10: qty 3

## 2024-03-10 MED ORDER — LACTATED RINGERS IV SOLN: INTRAVENOUS | Status: DC

## 2024-03-10 MED ORDER — IPRATROPIUM BROMIDE 0.02 % IN SOLN: 0.5000 mg | RESPIRATORY_TRACT | Status: DC | PRN

## 2024-03-10 MED ORDER — MAGNESIUM SULFATE 2 GM/50ML IV SOLN
2.0000 g | INTRAVENOUS | Status: AC
  Administered 2024-03-10: 2 g via INTRAVENOUS
  Filled 2024-03-10: qty 50

## 2024-03-10 MED ORDER — SODIUM CHLORIDE 0.9% FLUSH
3.0000 mL | Freq: Two times a day (BID) | INTRAVENOUS | Status: DC
  Administered 2024-03-11 – 2024-03-13 (×3): 3 mL via INTRAVENOUS

## 2024-03-10 MED ORDER — PANTOPRAZOLE SODIUM 40 MG PO TBEC
40.0000 mg | DELAYED_RELEASE_TABLET | Freq: Every day | ORAL | Status: DC
  Administered 2024-03-11: 40 mg via ORAL
  Filled 2024-03-10: qty 1

## 2024-03-10 MED ORDER — VITAMIN B-12 100 MCG PO TABS
500.0000 ug | ORAL_TABLET | Freq: Every day | ORAL | Status: DC
  Administered 2024-03-11 – 2024-03-13 (×3): 500 ug via ORAL
  Filled 2024-03-10 (×3): qty 5

## 2024-03-10 MED ORDER — SODIUM CHLORIDE 0.9% FLUSH
3.0000 mL | Freq: Two times a day (BID) | INTRAVENOUS | Status: DC
  Administered 2024-03-10 – 2024-03-13 (×6): 3 mL via INTRAVENOUS

## 2024-03-10 MED ORDER — GUAIFENESIN 100 MG/5ML PO LIQD
5.0000 mL | Freq: Once | ORAL | Status: AC
  Administered 2024-03-10: 5 mL via ORAL
  Filled 2024-03-10: qty 10

## 2024-03-10 MED ORDER — CALCIUM GLUCONATE-NACL 1-0.675 GM/50ML-% IV SOLN
1.0000 g | Freq: Once | INTRAVENOUS | Status: AC
  Administered 2024-03-11: 1000 mg via INTRAVENOUS
  Filled 2024-03-10: qty 50

## 2024-03-10 MED ORDER — SODIUM CHLORIDE 0.9 % IV SOLN
1.0000 g | Freq: Once | INTRAVENOUS | Status: AC
  Administered 2024-03-10: 1 g via INTRAVENOUS
  Filled 2024-03-10: qty 10

## 2024-03-10 MED ORDER — LIDOCAINE 5 % EX PTCH
1.0000 | MEDICATED_PATCH | CUTANEOUS | Status: DC
  Administered 2024-03-10 – 2024-03-12 (×3): 1 via TRANSDERMAL
  Filled 2024-03-10 (×3): qty 1

## 2024-03-10 MED ORDER — SODIUM ZIRCONIUM CYCLOSILICATE 10 G PO PACK
10.0000 g | PACK | Freq: Once | ORAL | Status: AC
  Administered 2024-03-10: 10 g via ORAL
  Filled 2024-03-10: qty 1

## 2024-03-10 MED ORDER — SODIUM CHLORIDE 0.9 % IV SOLN: 250.0000 mL | INTRAVENOUS | Status: AC | PRN

## 2024-03-10 MED ORDER — DOXYCYCLINE HYCLATE 100 MG PO TABS
100.0000 mg | ORAL_TABLET | Freq: Once | ORAL | Status: AC
  Administered 2024-03-10: 100 mg via ORAL
  Filled 2024-03-10: qty 1

## 2024-03-10 MED ORDER — TACROLIMUS 1 MG PO CAPS
1.0000 mg | ORAL_CAPSULE | Freq: Two times a day (BID) | ORAL | Status: DC
  Administered 2024-03-10 – 2024-03-13 (×6): 1 mg via ORAL
  Filled 2024-03-10 (×6): qty 1

## 2024-03-10 MED ORDER — METHYLPREDNISOLONE SODIUM SUCC 125 MG IJ SOLR
125.0000 mg | Freq: Once | INTRAMUSCULAR | Status: AC
  Administered 2024-03-10: 125 mg via INTRAVENOUS
  Filled 2024-03-10: qty 2

## 2024-03-10 MED ORDER — INSULIN PUMP: Freq: Three times a day (TID) | SUBCUTANEOUS | Status: DC

## 2024-03-10 MED ORDER — AZATHIOPRINE 50 MG PO TABS
100.0000 mg | ORAL_TABLET | Freq: Every day | ORAL | Status: DC
  Administered 2024-03-11 – 2024-03-13 (×3): 100 mg via ORAL
  Filled 2024-03-10 (×3): qty 2

## 2024-03-10 MED ORDER — SODIUM CHLORIDE 0.9 % IV SOLN: 100.0000 mg | Freq: Two times a day (BID) | INTRAVENOUS | Status: DC

## 2024-03-10 MED ORDER — INSULIN ASPART 100 UNIT/ML IJ SOLN: 0.0000 [IU] | Freq: Every day | INTRAMUSCULAR | Status: DC

## 2024-03-10 MED ORDER — SODIUM CHLORIDE 0.9 % IV BOLUS
1000.0000 mL | Freq: Once | INTRAVENOUS | Status: AC
  Administered 2024-03-10: 1000 mL via INTRAVENOUS

## 2024-03-10 MED ORDER — ATORVASTATIN CALCIUM 10 MG PO TABS
20.0000 mg | ORAL_TABLET | Freq: Every day | ORAL | Status: DC
  Administered 2024-03-11 – 2024-03-13 (×3): 20 mg via ORAL
  Filled 2024-03-10 (×3): qty 2

## 2024-03-10 MED ORDER — ACETAMINOPHEN 325 MG PO TABS: 650.0000 mg | ORAL_TABLET | Freq: Four times a day (QID) | ORAL | Status: DC | PRN

## 2024-03-10 MED ORDER — METOPROLOL TARTRATE 5 MG/5ML IV SOLN: 2.5000 mg | INTRAVENOUS | Status: DC | PRN

## 2024-03-10 MED ORDER — BENZONATATE 100 MG PO CAPS
200.0000 mg | ORAL_CAPSULE | Freq: Three times a day (TID) | ORAL | Status: DC
  Administered 2024-03-10 – 2024-03-13 (×8): 200 mg via ORAL
  Filled 2024-03-10 (×8): qty 2

## 2024-03-10 MED ORDER — GABAPENTIN 300 MG PO CAPS
300.0000 mg | ORAL_CAPSULE | Freq: Three times a day (TID) | ORAL | Status: DC
  Administered 2024-03-10 – 2024-03-13 (×8): 300 mg via ORAL
  Filled 2024-03-10 (×8): qty 1

## 2024-03-10 MED ORDER — INSULIN ASPART 100 UNIT/ML IJ SOLN
0.0000 [IU] | Freq: Four times a day (QID) | INTRAMUSCULAR | Status: DC
  Administered 2024-03-11 (×2): 3 [IU] via SUBCUTANEOUS
  Administered 2024-03-11: 2 [IU] via SUBCUTANEOUS

## 2024-03-10 MED ORDER — GUAIFENESIN ER 600 MG PO TB12
600.0000 mg | ORAL_TABLET | Freq: Two times a day (BID) | ORAL | Status: DC
  Administered 2024-03-10 – 2024-03-13 (×6): 600 mg via ORAL
  Filled 2024-03-10 (×6): qty 1

## 2024-03-10 MED ORDER — ONDANSETRON HCL 4 MG/2ML IJ SOLN: 4.0000 mg | Freq: Four times a day (QID) | INTRAMUSCULAR | Status: DC | PRN

## 2024-03-10 MED ORDER — SODIUM CHLORIDE 0.9% FLUSH: 3.0000 mL | INTRAVENOUS | Status: DC | PRN

## 2024-03-10 MED ORDER — SODIUM CHLORIDE 0.9 % IV SOLN
100.0000 mg | Freq: Two times a day (BID) | INTRAVENOUS | Status: DC
  Administered 2024-03-11 – 2024-03-13 (×5): 100 mg via INTRAVENOUS
  Filled 2024-03-10 (×6): qty 100

## 2024-03-10 MED ORDER — INSULIN ASPART 100 UNIT/ML IJ SOLN: 0.0000 [IU] | Freq: Three times a day (TID) | INTRAMUSCULAR | Status: DC

## 2024-03-10 MED ORDER — GABAPENTIN 300 MG PO CAPS: 600.0000 mg | ORAL_CAPSULE | Freq: Two times a day (BID) | ORAL | Status: DC

## 2024-03-10 NOTE — H&P (Signed)
 History and Physical    Marvelle Span ZOX:[id number] DOB: [birth year] DOA: 03/10/2024  PCP: Lawrance Presume, MD   Patient coming from: Home   Chief Complaint:  Chief Complaint  Patient presents with   Abdominal Pain   ED TRIAGE note:  Pt to ED c/o RUQ abdominal pain x 5 days. Hx kidney transplant. Also reports cough x 3 weeks. Denies urinary symptoms.       HPI:  Cathy Green is a 43 y.o. female with medical history significant of DM type I, CKD stage V s/p renal transplant 2021 (on Prograf  2 mg BID, Prednisone 5 mg daily, and Imuran 100 mg daily - follows [facility]), now CKD stage III of the transplant kidney, hyperlipidemia, hypertension, diabetic retinopathy, diabetic neuropathy, vitamin D  deficiency, GERD and chronic anemia presenting to the emergency department complaining of cough and right-sided abdominal pain.  Patient reported she has persistent cough for last 1 week has been taking Tessalon  by PCP without much improvement.  Reported intractable coughing and developed right-sided lower chest wall and abdominal pain 5 days ago.  Patient denies any fever, chill, nausea, vomiting, constipation and diarrhea.  ED Course:  At presentation to ED patient is tachycardic heart rate of 113 otherwise hemodynamically stable.  O2 sat 100% room air.  Chest x-ray showing left lower lobe retrocardiac infiltrate. Ultrasound ABD no gallstone or ductal dilation.  CBC unremarkable no evidence of leukocytosis, stable H&H and normal platelet count. CMP showing elevated potassium 5.4, creatinine 1.8, GFR 35.  Renal function at baseline. Lipase 45. Pregnancy test negative.  In the ED patient has been received ceftriaxone, doxycycline and albuterol nebulizer as well as 2 L of NS bolus. Even patient received 2 L of NS bolus in the ED patient remained persistently tachycardic there are some high suspicion for PE however due to advanced CKD unable to obtain CTA chest.  Plan to obtain  nuclear medicine pulmonary perfusion study to rule out PE.  Hospitalist has been consulted for management of community-acquired pneumonia, hyperkalemia and will obtain an VQ scan/NM pulmonary perfusion to rule out PE.  Spoke with on-call nephrology Dr. Christianne Cowper as a curbside consult who stated that given patient is not in sepsis can continue the immunosuppressant.  Recommended to avoid contrast exposure and agrees to do nuclear medicine study.   Signicant labs in the ED: Lab Orders         Culture, blood (Routine X 2) w Reflex to ID Panel         Expectorated Sputum Assessment w Gram Stain, Rflx to Resp Cult         Respiratory (~20 pathogens) panel by PCR         CBC with Differential         Comprehensive metabolic panel         Lipase, blood         Urinalysis, Routine w reflex microscopic -Urine, Clean Catch         hCG, serum, qualitative         Legionella Pneumophila Serogp 1 Ur Ag         Strep pneumoniae urinary antigen         D-dimer, quantitative         HIV Antibody (routine testing w rflx)         Procalcitonin         CBC         Comprehensive metabolic panel  Glucose, capillary       Review of Systems:  Review of Systems  Constitutional:  Negative for chills, fever, malaise/fatigue and weight loss.  Respiratory:  Positive for cough and sputum production. Negative for shortness of breath and wheezing.   Cardiovascular:  Negative for chest pain, palpitations and orthopnea.  Gastrointestinal:  Negative for abdominal pain, constipation, diarrhea, heartburn, nausea and vomiting.  Genitourinary:  Negative for dysuria, flank pain, frequency and urgency.  Musculoskeletal:  Negative for back pain, falls, joint pain, myalgias and neck pain.  Neurological:  Negative for dizziness and headaches.  Psychiatric/Behavioral:  The patient is not nervous/anxious.     Past Medical History:  Diagnosis Date   Abnormal Pap smear    Allergy    seasonal allergies   Anemia     Chronic kidney disease    Diabetes mellitus    diagnosed age 27   Diabetes mellitus without complication (HCC)    Glaucoma    Hyperlipidemia    Hypertension    Migraines    Noncompliance    Numbness in left leg     Past Surgical History:  Procedure Laterality Date   KIDNEY TRANSPLANT  06/2020   WISDOM TOOTH EXTRACTION       reports that she has never smoked. She has never used smokeless tobacco. She reports current alcohol use. She reports that she does not use drugs.  Allergies  Allergen Reactions   Garlic Nausea And Vomiting    Family History  Problem Relation Age of Onset   Hypertension Mother    Stroke Mother    Hypertension Father    Cancer Maternal Grandfather        prostate   Breast cancer Cousin        maternal 1st cousin   Cancer Other    COPD Other    Hyperlipidemia Other     Prior to Admission medications   Medication Sig Start Date End Date Taking? Authorizing Provider  amLODipine  (NORVASC ) 5 MG tablet Take 1.5 tablets (7.5 mg total) by mouth daily. 12/06/23   Lawrance Presume, MD  atorvastatin  (LIPITOR) 20 MG tablet Take 1 tablet (20 mg total) by mouth daily. 11/25/23   Shamleffer, Ibtehal Jaralla, MD  azaTHIOprine (IMURAN) 50 MG tablet Take 100 mg by mouth daily.    [provider]  benzonatate  (TESSALON ) 200 MG capsule Take 1 capsule (200 mg total) by mouth 2 (two) times daily as needed for cough. 03/04/24   Collins Dean, NP  Brinzolamide -Brimonidine  (SIMBRINZA ) 1-0.2 % SUSP Instill 1 drop into both eyes twice a day 04/29/23     Continuous Glucose Sensor (DEXCOM G7 SENSOR) MISC Change sensore every 10 days. 11/25/23   Shamleffer, Ibtehal Jaralla, MD  cyanocobalamin 1000 MCG tablet Take by mouth.    [provider]  fluticasone  (FLONASE ) 50 MCG/ACT nasal spray Place 2 sprays into both nostrils daily. 02/06/21   Lawrance Presume, MD  gabapentin  (NEURONTIN ) 300 MG capsule Take 2 capsules (600 mg total) by mouth 2 (two) times daily. Needs  to be seen prior to next refill request. 08/07/23   Hassie Lint, PA-C  glucose blood (CONTOUR NEXT TEST) test strip Use as instructed to check blood sugar TID. 07/19/20   Lawrance Presume, MD  Glucose Blood (TRUE METRIX BLOOD GLUCOSE TEST VI)     [provider]  insulin  aspart (NOVOLOG ) 100 UNIT/ML injection Inject 80 units max daily dose. Use with pump 11/25/23   Shamleffer, Ibtehal Jaralla, MD  Insulin  Disposable Pump (OMNIPOD 5 [id number] PODS GEN 5) MISC Use as directed and change pod every 3 days 11/25/23   Shamleffer, Ibtehal Jaralla, MD  losartan  (COZAAR ) 25 MG tablet Take 1 tablet (25 mg total) by mouth daily. 12/19/23   Lawrance Presume, MD  metroNIDAZOLE  (FLAGYL ) 500 MG tablet Take 1 tablet (500 mg total) by mouth 2 (two) times daily. 01/19/24   Lawrance Presume, MD  mupirocin  ointment (BACTROBAN ) 2 % Apply 1 Application topically 2 (two) times daily. 08/07/23   Hassie Lint, PA-C  nortriptyline  (PAMELOR ) 10 MG capsule Take 1 capsule (10 mg total) by mouth at bedtime. 04/23/23   Merriam Abbey, DO  nortriptyline  (PAMELOR ) 25 MG capsule Take 1 capsule (25 mg total) by mouth at bedtime. 10/30/23   Merriam Abbey, DO  ondansetron  (ZOFRAN ) 4 MG tablet Take 1 tablet (4 mg total) by mouth every 8 (eight) hours as needed for Nausea / Vomiting. 04/23/23   Festus Hubert, Adam R, DO  pantoprazole (PROTONIX) 40 MG tablet Take 40 mg by mouth daily. 06/10/23   [provider]  predniSONE (DELTASONE) 5 MG tablet Take 5 mg by mouth daily with breakfast.    [provider]  rizatriptan  (MAXALT -MLT) 10 MG disintegrating tablet Take 1 tablet (10 mg total) by mouth as needed for migraine. May repeat in 2 hours if needed.  Maximum 2 tablets in 24 hours. 10/30/23   Merriam Abbey, DO  tacrolimus  (PROGRAF ) 1 MG capsule Take 1 mg by mouth 2 (two) times daily. 2 cap in the morning, and 2 cap at night 07/26/20   [provider]  tacrolimus  (PROGRAF ) 1 MG capsule Take by mouth. 05/23/23    [provider]  Vitamin D , Ergocalciferol , (DRISDOL ) 1.25 MG (50000 UNIT) CAPS capsule Take 1 capsule (50,000 Units total) by mouth once a week. 02/27/24        Physical Exam: Vitals:   03/10/24 2100 03/10/24 2130 03/10/24 2313 03/10/24 2319  BP: 137/84 135/89 134/80   Pulse: (!) 111 (!) 113 (!) 111   Resp: 19 (!) 30 20   Temp:   98.1 F (36.7 C)   TempSrc:   Oral   SpO2: 97% 97% 97%   Weight:    89.7 kg  Height:    5' 1 (1.549 m)    Physical Exam Vitals and nursing note reviewed.  Cardiovascular:     Rate and Rhythm: Normal rate and regular rhythm.     Heart sounds: No murmur heard. Pulmonary:     Effort: Pulmonary effort is normal. No respiratory distress.     Breath sounds: Normal breath sounds. No wheezing, rhonchi or rales.     Comments: Right-sided chest wall tenderness. Chest:     Chest wall: Tenderness present.  Abdominal:     Palpations: Abdomen is soft.     Tenderness: There is no abdominal tenderness. There is no guarding or rebound. Negative signs include Murphy's sign.  Skin:    Capillary Refill: Capillary refill takes less than 2 seconds.  Neurological:     Mental Status: She is oriented to person, place, and time.      Labs on Admission: I have personally reviewed following labs and imaging studies  CBC: Recent Labs  Lab 03/10/24 1538  WBC 6.4  NEUTROABS 4.5  HGB 11.2*  HCT 37.1  MCV 87.5  PLT 264   Basic Metabolic Panel: Recent Labs  Lab 03/10/24 1538  NA 137  K 5.4*  CL 107  CO2 22  GLUCOSE 181*  BUN 26*  CREATININE 1.84*  CALCIUM  9.0   GFR: Estimated Creatinine Clearance: 40.6 mL/min (A) (by C-G formula based on SCr of 1.84 mg/dL (H)). Liver Function Tests: Recent Labs  Lab 03/10/24 1538  AST 26  ALT 52*  ALKPHOS 80  BILITOT 0.6  PROT 7.3  ALBUMIN 3.2*   Recent Labs  Lab 03/10/24 1538  LIPASE 45   No results for input(s): AMMONIA in the last 168 hours. Coagulation Profile: No results for input(s):  INR, PROTIME in the last 168 hours. Cardiac Enzymes: No results for input(s): CKTOTAL, CKMB, CKMBINDEX, TROPONINI, TROPONINIHS in the last 168 hours. BNP (last 3 results) No results for input(s): BNP in the last 8760 hours. HbA1C: No results for input(s): HGBA1C in the last 72 hours. CBG: Recent Labs  Lab 03/10/24 2344  GLUCAP 189*   Lipid Profile: No results for input(s): CHOL, HDL, LDLCALC, TRIG, CHOLHDL, LDLDIRECT in the last 72 hours. Thyroid  Function Tests: No results for input(s): TSH, T4TOTAL, FREET4, T3FREE, THYROIDAB in the last 72 hours. Anemia Panel: No results for input(s): VITAMINB12, FOLATE, FERRITIN, TIBC, IRON, RETICCTPCT in the last 72 hours. Urine analysis:    Component Value Date/Time   COLORURINE YELLOW 03/10/2024 1527   APPEARANCEUR CLEAR 03/10/2024 1527   LABSPEC 1.012 03/10/2024 1527   LABSPEC 1.025 09/07/2011 1500   PHURINE 7.0 03/10/2024 1527   GLUCOSEU 50 (A) 03/10/2024 1527   HGBUR NEGATIVE 03/10/2024 1527   BILIRUBINUR NEGATIVE 03/10/2024 1527   BILIRUBINUR neg 10/18/2015 1421   BILIRUBINUR Negative 09/07/2011 1500   KETONESUR NEGATIVE 03/10/2024 1527   PROTEINUR 100 (A) 03/10/2024 1527   UROBILINOGEN 0.2 10/18/2015 1421   NITRITE NEGATIVE 03/10/2024 1527   LEUKOCYTESUR MODERATE (A) 03/10/2024 1527   LEUKOCYTESUR Negative 09/07/2011 1500    Radiological Exams on Admission: I have personally reviewed images US  Abdomen Limited Result Date: 03/10/2024 CLINICAL DATA:  Right upper quadrant pain.  Cough EXAM: ULTRASOUND ABDOMEN LIMITED RIGHT UPPER QUADRANT COMPARISON:  None Available. FINDINGS: Gallbladder: Gallbladder is mildly distended. No wall thickening or adjacent fluid. No reported Murphy's sign. No shadowing stones or sludge identified this time. Common bile duct: Diameter: 4 mm Liver: No focal lesion identified. Within normal limits in parenchymal echogenicity. Portal vein is patent on color  Doppler imaging with normal direction of blood flow towards the liver. Other: Evaluation limited by overlapping bowel gas and soft tissue. IMPRESSION: No gallstones or ductal dilatation. Electronically Signed   By: Adrianna Horde M.D.   On: 03/10/2024 16:29   DG Chest 2 View Result Date: 03/10/2024 CLINICAL DATA:  Right upper quadrant pain and cough EXAM: CHEST - 2 VIEW COMPARISON:  November 03, 2018 FINDINGS: Ill-defined left lower lobe retrocardiac infiltrates. Right lung clear Heart and mediastinum normal IMPRESSION: Left lower lobe retrocardiac infiltrates Electronically Signed   By: Fredrich Jefferson M.D.   On: 03/10/2024 16:09    Assessment/Plan: Principal Problem:   CAP (community acquired pneumonia) Active Problems:   Hyperkalemia   History of renal transplant   Hyperlipidemia   Diabetic retinopathy (HCC)   Insulin  dependent type 1 diabetes mellitus (HCC)   Chronic kidney disease (CKD), stage III (moderate) (HCC)   Diabetic neuropathy (HCC)   Anemia due to chronic kidney disease    Assessment and Plan: Community-acquired pneumonia Right lower chest pain and abdominal pain-reproducible -Patient presented to emergency department complaining of persistent hacking cough for last 1 week with associated right-sided lower chest wall and upper abdominal pain.  Patient denies any fever, chill, and known sick contact. - At presentation to ED patient found tachycardic otherwise hemodynamically stable.  Afebrile.  O2 sat 100% room air - CBC no evidence of leukocytosis.  Stable H&H and normal platelet count.  CMP showing elevated potassium 5.4.  Creatinine 1.8 which is around baseline and GFR 35. -Physical exam showing reproducible right-sided lower chest wall pain. - Chest x-ray evidence of pneumothorax and left lower lobe retrocardiac infiltrate. - Right upper quadrant ultrasound no gallstones or ductal dilation. - Due to persistent tachycardia obtaining nuclear medicine PE study.  Checking  D-dimer.  Due to advanced CKD unable to obtain CTA chest. - In the ED patient has been treated with IV ceftriaxone and azithromycin.  As well as 2 L of LR bolus in the setting of tachycardia. - Continue IV ceftriaxone and doxycycline in the setting of pneumonia. - Obtaining blood culture, sputum culture, respiratory panel.  Checking urine Legionella and strep antigen.  Checking procalcitonin level.  Persistent tachycardia -Patient is persistently tachycardic even with fluid resuscitation.  Tachycardia could be from underlying cough versus immunocompromised and hypercoagulable status.  Need to rule out PE.  Checking D-dimer. Obtaining nuclear medicine pulmonary study to rule out PE.   Hyperkalemia Elevated potassium 5.4, calcium  gluconate.  Giving oral Lokelma.  Obtaining EKG.  History of diabetic nephropathy associated CKD stage V s/p renal transplant in 2019 CKD stage IIIb of the transplant kidney Immunocompromised status -Creatinine 1.8 and GFR 35.  Baseline creatinine around 1.6-3.4.  Renal function at baseline. -Continue Imuran 100 mg daily, prednisone 5 mg daily and Prograf  1 mg twice daily.  Insulin -dependent DM type I -At home patient is on Dexcom with and on insulin  insulin  pump.Holding insulin  pump and requested patient to turn off the Dexcom. -Consulted inpatient diabetic educator to manage insulin  pump. - Continue sliding scale insulin  coverage with POC blood glucose check.   Anemia of chronic disease -Stable H&H 11 and 37.  Continue to monitor.   Essential hypertension - Continue amlodipine  7.5 mg daily and losartan  25 mg daily  DVT prophylaxis:  SQ Heparin Code Status:  Full Code Diet: Heart healthy carb modified diet Disposition Plan: Pending blood culture sputum culture results. Consults: None indicated at this time. Admission status:   Inpatient, Telemetry bed  Severity of Illness: The appropriate patient status for this patient is INPATIENT. Inpatient status  is judged to be reasonable and necessary in order to provide the required intensity of service to ensure the patient's safety. The patient's presenting symptoms, physical exam findings, and initial radiographic and laboratory data in the context of their chronic comorbidities is felt to place them at high risk for further clinical deterioration. Furthermore, it is not anticipated that the patient will be medically stable for discharge from the hospital within 2 midnights of admission.   * I certify that at the point of admission it is my clinical judgment that the patient will require inpatient hospital care spanning beyond 2 midnights from the point of admission due to high intensity of service, high risk for further deterioration and high frequency of surveillance required.Aaron Aas    Derian Pfost, MD [facility]  How to contact the [facility] Attending or Consulting provider 7A - 7P or covering provider during after hours 7P -7A, for this patient.  Check the care team in [facility] and look for a) attending/consulting [facility] provider listed and b) the [facility] team listed Log into [email or web address] and use [facility]'s universal password to access. If you do  not have the password, please contact the hospital operator. Locate the [facility] provider you are looking for under [facility] and page to a number that you can be directly reached. If you still have difficulty reaching the provider, please page the [facility] (Director on Call) for the Hospitalists listed on amion for assistance.  03/11/2024, 1:26 AM

## 2024-03-10 NOTE — ED Provider Notes (Signed)
  EMERGENCY DEPARTMENT AT [facility] Provider Note   CSN: [id number] Arrival date & time: 03/10/24  1522     History  Chief Complaint  Patient presents with   Abdominal Pain    Cathy Green is a 43 y.o. female who presents emergency department with a chief complaint of cough and right side pain.  Patient reports that she has had a persistent cough for the past 1 week.  She has been taking Tessalon  Perles prescribed by her physician without improvement.  She reports intractable coughing and developed pain on the right side of her right lower chest upper abdomen 5 days ago.  She reports that because her pain was severe she came here for further evaluation.  She has a past medical history of type 1 diabetes and is status post kidney transplant.  She reports she has had no problems with her kidneys since the transplant and is taking Imuran  and talk tacrolimus  for her kidney transplant.  He denies fevers.   Abdominal Pain      Home Medications Prior to Admission medications   Medication Sig Start Date End Date Taking? Authorizing Provider  amLODipine  (NORVASC ) 5 MG tablet Take 1.5 tablets (7.5 mg total) by mouth daily. 12/06/23   Lawrance Presume, MD  atorvastatin  (LIPITOR) 20 MG tablet Take 1 tablet (20 mg total) by mouth daily. 11/25/23   Shamleffer, Ibtehal Jaralla, MD  azaTHIOprine  (IMURAN ) 50 MG tablet Take 100 mg by mouth daily.    [provider]  benzonatate  (TESSALON ) 200 MG capsule Take 1 capsule (200 mg total) by mouth 2 (two) times daily as needed for cough. 03/04/24   Fleming, Zelda W, NP  Brinzolamide -Brimonidine  (SIMBRINZA ) 1-0.2 % SUSP Instill 1 drop into both eyes twice a day 04/29/23     Continuous Glucose Sensor (DEXCOM G7 SENSOR) MISC Change sensore every 10 days. 11/25/23   Shamleffer, Julian Obey, MD  cyanocobalamin  1000 MCG tablet Take by mouth.    [provider]  fluticasone  (FLONASE ) 50 MCG/ACT nasal spray Place 2 sprays into  both nostrils daily. 02/06/21   Lawrance Presume, MD  gabapentin  (NEURONTIN ) 300 MG capsule Take 2 capsules (600 mg total) by mouth 2 (two) times daily. Needs to be seen prior to next refill request. 08/07/23   Hassie Lint, PA-C  glucose blood (CONTOUR NEXT TEST) test strip Use as instructed to check blood sugar TID. 07/19/20   Lawrance Presume, MD  Glucose Blood (TRUE METRIX BLOOD GLUCOSE TEST VI)     [provider]  insulin  aspart (NOVOLOG ) 100 UNIT/ML injection Inject 80 units max daily dose. Use with pump 11/25/23   Shamleffer, Ibtehal Jaralla, MD  Insulin  Disposable Pump (OMNIPOD 5 [id number] PODS GEN 5) MISC Use as directed and change pod every 3 days 11/25/23   Shamleffer, Ibtehal Jaralla, MD  losartan  (COZAAR ) 25 MG tablet Take 1 tablet (25 mg total) by mouth daily. 12/19/23   Lawrance Presume, MD  metroNIDAZOLE  (FLAGYL ) 500 MG tablet Take 1 tablet (500 mg total) by mouth 2 (two) times daily. 01/19/24   Lawrance Presume, MD  mupirocin  ointment (BACTROBAN ) 2 % Apply 1 Application topically 2 (two) times daily. 08/07/23   Hassie Lint, PA-C  nortriptyline  (PAMELOR ) 10 MG capsule Take 1 capsule (10 mg total) by mouth at bedtime. 04/23/23   Merriam Abbey, DO  nortriptyline  (PAMELOR ) 25 MG capsule Take 1 capsule (25 mg total) by mouth at bedtime. 10/30/23   Merriam Abbey,  DO  ondansetron  (ZOFRAN ) 4 MG tablet Take 1 tablet (4 mg total) by mouth every 8 (eight) hours as needed for Nausea / Vomiting. 04/23/23   Merriam Abbey, DO  pantoprazole  (PROTONIX ) 40 MG tablet Take 40 mg by mouth daily. 06/10/23   [provider]  predniSONE  (DELTASONE ) 5 MG tablet Take 5 mg by mouth daily with breakfast.    [provider]  rizatriptan  (MAXALT -MLT) 10 MG disintegrating tablet Take 1 tablet (10 mg total) by mouth as needed for migraine. May repeat in 2 hours if needed.  Maximum 2 tablets in 24 hours. 10/30/23   Merriam Abbey, DO  tacrolimus  (PROGRAF ) 1 MG capsule Take 1 mg by mouth  2 (two) times daily. 2 cap in the morning, and 2 cap at night 07/26/20   [provider]  tacrolimus  (PROGRAF ) 1 MG capsule Take by mouth. 05/23/23   [provider]  Vitamin D , Ergocalciferol , (DRISDOL ) 1.25 MG (50000 UNIT) CAPS capsule Take 1 capsule (50,000 Units total) by mouth once a week. 02/27/24         Allergies    Garlic    Review of Systems   Review of Systems  Gastrointestinal:  Positive for abdominal pain.    Physical Exam Updated Vital Signs BP (!) 154/96 (BP Location: Right Arm)   Pulse (!) 113   Temp 98.7 F (37.1 C)   Resp 16   Ht 5' 1 (1.549 m)   Wt 90.3 kg   LMP 02/26/2024   SpO2 96%   BMI 37.60 kg/m  Physical Exam Vitals and nursing note reviewed.  Constitutional:      General: She is not in acute distress.    Appearance: She is well-developed. She is not diaphoretic.  HENT:     Head: Normocephalic and atraumatic.     Right Ear: External ear normal.     Left Ear: External ear normal.     Nose: Nose normal.     Mouth/Throat:     Mouth: Mucous membranes are moist.  Eyes:     General: No scleral icterus.    Conjunctiva/sclera: Conjunctivae normal.  Cardiovascular:     Rate and Rhythm: Normal rate and regular rhythm.     Heart sounds: Normal heart sounds. No murmur heard.    No friction rub. No gallop.  Pulmonary:     Effort: Pulmonary effort is normal. No respiratory distress.     Breath sounds: Examination of the right-upper field reveals wheezing. Examination of the left-upper field reveals wheezing. Examination of the right-middle field reveals wheezing. Examination of the left-middle field reveals wheezing. Examination of the right-lower field reveals wheezing. Examination of the left-lower field reveals wheezing. Wheezing present.  Chest:       Comments: Tender to outpatient over the right upper quadrant and right chest wall Abdominal:     General: Bowel sounds are normal. There is no distension.     Palpations: Abdomen is  soft. There is no mass.     Tenderness: There is no abdominal tenderness. There is no guarding.  Musculoskeletal:     Cervical back: Normal range of motion.  Skin:    General: Skin is warm and dry.  Neurological:     Mental Status: She is alert and oriented to person, place, and time.  Psychiatric:        Behavior: Behavior normal.    ED Results / Procedures / Treatments   Labs (all labs ordered are listed, but only abnormal results are  displayed) Labs Reviewed  URINALYSIS, ROUTINE W REFLEX MICROSCOPIC - Abnormal; Notable for the following components:      Result Value   Glucose, UA 50 (*)    Protein, ur 100 (*)    Leukocytes,Ua MODERATE (*)    Bacteria, UA RARE (*)    All other components within normal limits  CBC WITH DIFFERENTIAL/PLATELET  COMPREHENSIVE METABOLIC PANEL WITH GFR  LIPASE, BLOOD  HCG, SERUM, QUALITATIVE    EKG None  Radiology DG Chest 2 View Result Date: 03/10/2024 CLINICAL DATA:  Right upper quadrant pain and cough EXAM: CHEST - 2 VIEW COMPARISON:  November 03, 2018 FINDINGS: Ill-defined left lower lobe retrocardiac infiltrates. Right lung clear Heart and mediastinum normal IMPRESSION: Left lower lobe retrocardiac infiltrates Electronically Signed   By: Fredrich Jefferson M.D.   On: 03/10/2024 16:09    Procedures Procedures    Medications Ordered in ED Medications  ipratropium-albuterol  (DUONEB) 0.5-2.5 (3) MG/3ML nebulizer solution 3 mL (has no administration in time range)  methylPREDNISolone  sodium succinate (SOLU-MEDROL ) 125 mg/2 mL injection 125 mg (has no administration in time range)  magnesium  sulfate IVPB 2 g 50 mL (has no administration in time range)    ED Course/ Medical Decision Making/ A&P Clinical Course as of 03/10/24 2057  Tue Mar 10, 2024  1714 Creatinine(!): 1.84 [AH]  1714 BUN(!): 26 [AH]  1714 Glucose(!): 181 [AH]  1824 I considered a CT Angio to r/o PE. The patient's CXR shows Left  retrocardiac opacities suggestive of a  pneumonia.  She has no abnormalities on the right side, which is where she is having pain.  I suspect that if she had a large infarct in the right lower lobe we would see some abnormalities suggestive of a pneumonia on the right side on plain film.  Patient also has reproducible pain to palpation of the anterior right chest wall and upper abdomen most suggestive of muscle strain from coughing.  Due to patient's current GFR of 35% history of type 1 diabetes and renal transplant and in shared decision making understanding the risks of not diagnosing a PE patient has declined any further workup and understands risk.  I think she is very low risk for PE at this time. [AH]    Clinical Course User Index [AH] Tama Fails, PA-C                                 Medical Decision Making Amount and/or Complexity of Data Reviewed Labs:  Decision-making details documented in ED Course. Radiology: ordered.  Risk OTC drugs. Prescription drug management. Decision regarding hospitalization.   This patient presents to the ED for concern of RUQ pain and cough, this involves an extensive number of treatment options, and is a complaint that carries with it a high risk of complications and morbidity.  DDX includes biliary colic, pneumonia, muscle strain injury, herpes zoster, pneumonia, pulmonary embolus, mass.  Co morbidities:  dependent type 1 diabetes, status post renal transplant, immunocompromise state  Social Determinants of Health:  \ SDOH Screenings   Food Insecurity: Low Risk  (02/27/2024)   Received from [facility]  Housing: Low Risk  (02/27/2024)   Received from [facility]  Transportation Needs: No Transportation Needs (02/27/2024)   Received from [facility]  Utilities: Low Risk  (02/27/2024)   Received from [facility]  Alcohol Screen: Low Risk  (12/06/2023)  Depression (PHQ2-9): Low Risk  (01/16/2024)  Financial Resource Strain:  Low Risk  (12/06/2023)  Physical Activity:  Sufficiently Active (12/06/2023)  Social Connections: Socially Isolated (12/06/2023)  Stress: No Stress Concern Present (12/06/2023)  Tobacco Use: Low Risk  (03/10/2024)  Health Literacy: Adequate Health Literacy (08/07/2023)     Additional history:  Patient at bedside, review of EMR {External records from outside source obtained and reviewed including outside transplant team records  Lab Tests:  I Ordered, and personally interpreted labs.  The pertinent results include:   Labs reviewed, CBC unremarkable.  CMP with slightly elevated blood glucose at 181, potassium at 5.4 not significantly elevated.  Patient's creatinine at 1.84 GFR 3%  Imaging Studies:  I ordered imaging studies including chest x-ray I independently visualized and interpreted imaging which showed retrocardiac opacities on the left I agree with the radiologist interpretation  Cardiac Monitoring/ECG:  The patient was maintained on a cardiac monitor.  I personally viewed and interpreted the cardiac monitored which showed an underlying rhythm of:  Sinus tachycardia  Medicines ordered and prescription drug management:  I ordered medication including  Medications  lidocaine  (LIDODERM ) 5 % 1 patch (1 patch Transdermal Patch Applied 03/10/24 1732)  sodium chloride  0.9 % bolus 1,000 mL (has no administration in time range)  ipratropium-albuterol  (DUONEB) 0.5-2.5 (3) MG/3ML nebulizer solution 3 mL (3 mLs Nebulization Given 03/10/24 1640)  methylPREDNISolone  sodium succinate (SOLU-MEDROL ) 125 mg/2 mL injection 125 mg (125 mg Intravenous Given 03/10/24 1639)  magnesium  sulfate IVPB 2 g 50 mL (0 g Intravenous Stopped 03/10/24 1757)  cefTRIAXone  (ROCEPHIN ) 1 g in sodium chloride  0.9 % 100 mL IVPB (0 g Intravenous Stopped 03/10/24 1928)  doxycycline  (VIBRA -TABS) tablet 100 mg (100 mg Oral Given 03/10/24 1848)  guaiFENesin  (ROBITUSSIN) 100 MG/5ML liquid 5 mL (5 mLs Oral Given 03/10/24 1848)  sodium chloride  0.9 % bolus 1,000 mL (0 mLs  Intravenous Stopped 03/10/24 1951)   for CAP , bronchospasm, tachycardia Reevaluation of the patient after these medicines showed that the patient improved I have reviewed the patients home medicines and have made adjustments as needed  Test Considered:  as per ed course  Critical Interventions:  Duoneb, fluids, abx  Consultations Obtained:    Problem List / ED Course:     ICD-10-CM   1. Community acquired pneumonia of left lower lobe of lung  J18.9     2. Sinus tachycardia  R00.0     3. Immunocompromised state (HCC)  D84.9       MDM: Patient with immunocompromis, CAP and persistent tachycardia prior to albuterol  and after fluid resuscitation. Patient would benefit from Observation, cardiac monitoring and continued antibiotic therapy.           Final Clinical Impression(s) / ED Diagnoses Final diagnoses:  None    Rx / DC Orders ED Discharge Orders     None         Tama Fails, PA-C 03/12/24 2235    Merdis Stalling, MD 03/15/24 [phone]

## 2024-03-10 NOTE — ED Triage Notes (Signed)
 Pt to ED c/o RUQ abdominal pain x 5 days. Hx kidney transplant. Also reports cough x 3 weeks. Denies urinary symptoms.

## 2024-03-10 NOTE — ED Provider Triage Note (Signed)
 Emergency Medicine Provider Triage Evaluation Note  Cathy Green , a 43 y.o. female  was evaluated in triage.  Pt complains of 43 year old female with RUQ pain x 5 days. Not associated with n/v, changes in bowel or bladder habits, fevers, chills. Also cough x 3 weeks. Hx right renal transplant in 2021.  Review of Systems  Positive:  Negative:   Physical Exam  BP (!) 154/96 (BP Location: Right Arm)   Pulse (!) 113   Temp 98.7 F (37.1 C)   Resp 16   Ht 5\' 1"  (1.549 m)   Wt 90.3 kg   LMP 02/26/2024   SpO2 96%   BMI 37.60 kg/m  Gen:   Awake, no distress   Resp:  Normal effort  MSK:   Moves extremities without difficulty  Other:  RUQ TTP, +Guila Owensby  Medical Decision Making  Medically screening exam initiated at 3:31 PM.  Appropriate orders placed.  Xan Sparkman was informed that the remainder of the evaluation will be completed by another provider, this initial triage assessment does not replace that evaluation, and the importance of remaining in the ED until their evaluation is complete.     Darlis Eisenmenger, PA-C 03/10/24 [phone]

## 2024-03-10 NOTE — ED Notes (Signed)
 This RN spoke with Sundil MD on the telephone.  MD requested this RN to instruct pt to turn off insulin  pump.  Pt complied.

## 2024-03-11 ENCOUNTER — Inpatient Hospital Stay (HOSPITAL_COMMUNITY)

## 2024-03-11 ENCOUNTER — Other Ambulatory Visit: Payer: Self-pay

## 2024-03-11 DIAGNOSIS — J189 Pneumonia, unspecified organism: Secondary | ICD-10-CM | POA: Diagnosis not present

## 2024-03-11 LAB — COMPREHENSIVE METABOLIC PANEL WITH GFR
ALT: 45 U/L — ABNORMAL HIGH (ref 0–44)
AST: 17 U/L (ref 15–41)
Albumin: 2.9 g/dL — ABNORMAL LOW (ref 3.5–5.0)
Alkaline Phosphatase: 77 U/L (ref 38–126)
Anion gap: 5 (ref 5–15)
BUN: 21 mg/dL — ABNORMAL HIGH (ref 6–20)
CO2: 19 mmol/L — ABNORMAL LOW (ref 22–32)
Calcium: 9.2 mg/dL (ref 8.9–10.3)
Chloride: 110 mmol/L (ref 98–111)
Creatinine, Ser: 1.37 mg/dL — ABNORMAL HIGH (ref 0.44–1.00)
GFR, Estimated: 49 mL/min — ABNORMAL LOW (ref >60)
Glucose, Bld: 235 mg/dL — ABNORMAL HIGH (ref 70–99)
Potassium: 5.2 mmol/L — ABNORMAL HIGH (ref 3.5–5.1)
Sodium: 134 mmol/L — ABNORMAL LOW (ref 135–145)
Total Bilirubin: 0.6 mg/dL (ref 0.0–1.2)
Total Protein: 7.2 g/dL (ref 6.5–8.1)

## 2024-03-11 LAB — RESPIRATORY PANEL BY PCR

## 2024-03-11 LAB — PROCALCITONIN: Procalcitonin: <0.1 ng/mL

## 2024-03-11 LAB — GLUCOSE, CAPILLARY
Glucose-Capillary: 236 mg/dL — ABNORMAL HIGH (ref 70–99)
Glucose-Capillary: 242 mg/dL — ABNORMAL HIGH (ref 70–99)
Glucose-Capillary: 249 mg/dL — ABNORMAL HIGH (ref 70–99)
Glucose-Capillary: 280 mg/dL — ABNORMAL HIGH (ref 70–99)
Glucose-Capillary: 284 mg/dL — ABNORMAL HIGH (ref 70–99)

## 2024-03-11 LAB — D-DIMER, QUANTITATIVE: D-Dimer, Quant: 0.35 ug{FEU}/mL (ref 0.00–0.50)

## 2024-03-11 LAB — CBC
HCT: 37.4 % (ref 36.0–46.0)
Hemoglobin: 11.6 g/dL — ABNORMAL LOW (ref 12.0–15.0)
MCH: 26.1 pg (ref 26.0–34.0)
MCHC: 31 g/dL (ref 30.0–36.0)
MCV: 84.2 fL (ref 80.0–100.0)
Platelets: 262 10*3/uL (ref 150–400)
RBC: 4.44 MIL/uL (ref 3.87–5.11)
RDW: 17.3 % — ABNORMAL HIGH (ref 11.5–15.5)
WBC: 9.1 10*3/uL (ref 4.0–10.5)
nRBC: 0 % (ref 0.0–0.2)

## 2024-03-11 LAB — HIV ANTIBODY (ROUTINE TESTING W REFLEX): HIV Screen 4th Generation wRfx: NONREACTIVE

## 2024-03-11 LAB — STREP PNEUMONIAE URINARY ANTIGEN: Strep Pneumo Urinary Antigen: NEGATIVE

## 2024-03-11 MED ORDER — SODIUM BICARBONATE 650 MG PO TABS
650.0000 mg | ORAL_TABLET | Freq: Two times a day (BID) | ORAL | Status: AC
  Administered 2024-03-11 (×2): 650 mg via ORAL
  Filled 2024-03-11 (×2): qty 1

## 2024-03-11 MED ORDER — TECHNETIUM TO 99M ALBUMIN AGGREGATED
4.4000 | Freq: Once | INTRAVENOUS | Status: AC | PRN
  Administered 2024-03-11: 4.4 via INTRAVENOUS

## 2024-03-11 MED ORDER — PANTOPRAZOLE SODIUM 40 MG PO TBEC
40.0000 mg | DELAYED_RELEASE_TABLET | Freq: Two times a day (BID) | ORAL | Status: DC
  Administered 2024-03-11 – 2024-03-13 (×4): 40 mg via ORAL
  Filled 2024-03-11 (×4): qty 1

## 2024-03-11 MED ORDER — METOPROLOL TARTRATE 25 MG PO TABS
25.0000 mg | ORAL_TABLET | Freq: Two times a day (BID) | ORAL | Status: DC
  Administered 2024-03-11 – 2024-03-13 (×5): 25 mg via ORAL
  Filled 2024-03-11 (×5): qty 1

## 2024-03-11 MED ORDER — INSULIN ASPART 100 UNIT/ML IJ SOLN
4.0000 [IU] | Freq: Three times a day (TID) | INTRAMUSCULAR | Status: DC
  Administered 2024-03-11 – 2024-03-13 (×6): 4 [IU] via SUBCUTANEOUS

## 2024-03-11 MED ORDER — DEXTROMETHORPHAN POLISTIREX ER 30 MG/5ML PO SUER
30.0000 mg | Freq: Two times a day (BID) | ORAL | Status: DC
  Administered 2024-03-11 – 2024-03-13 (×4): 30 mg via ORAL
  Filled 2024-03-11 (×5): qty 5

## 2024-03-11 MED ORDER — NORTRIPTYLINE HCL 25 MG PO CAPS
25.0000 mg | ORAL_CAPSULE | Freq: Every day | ORAL | Status: DC
  Administered 2024-03-11 – 2024-03-12 (×2): 25 mg via ORAL
  Filled 2024-03-11 (×3): qty 1

## 2024-03-11 MED ORDER — INSULIN GLARGINE-YFGN 100 UNIT/ML ~~LOC~~ SOLN
20.0000 [IU] | Freq: Every day | SUBCUTANEOUS | Status: DC
  Administered 2024-03-11 – 2024-03-12 (×2): 20 [IU] via SUBCUTANEOUS
  Filled 2024-03-11 (×2): qty 0.2

## 2024-03-11 MED ORDER — INSULIN ASPART 100 UNIT/ML IJ SOLN
0.0000 [IU] | INTRAMUSCULAR | Status: DC
  Administered 2024-03-11: 2 [IU] via SUBCUTANEOUS
  Administered 2024-03-11 (×2): 3 [IU] via SUBCUTANEOUS
  Administered 2024-03-12: 2 [IU] via SUBCUTANEOUS
  Administered 2024-03-12: 1 [IU] via SUBCUTANEOUS
  Administered 2024-03-12: 3 [IU] via SUBCUTANEOUS
  Administered 2024-03-12: 2 [IU] via SUBCUTANEOUS
  Administered 2024-03-12: 1 [IU] via SUBCUTANEOUS
  Administered 2024-03-13: 2 [IU] via SUBCUTANEOUS
  Administered 2024-03-13: 1 [IU] via SUBCUTANEOUS

## 2024-03-11 MED ORDER — CALCIUM GLUCONATE-NACL 1-0.675 GM/50ML-% IV SOLN
1.0000 g | Freq: Once | INTRAVENOUS | Status: DC
  Filled 2024-03-11: qty 50

## 2024-03-11 MED ORDER — HYDROCODONE BIT-HOMATROP MBR 5-1.5 MG/5ML PO SOLN
5.0000 mL | ORAL | Status: DC | PRN
  Administered 2024-03-13: 5 mL via ORAL
  Filled 2024-03-11: qty 5

## 2024-03-11 MED ORDER — LABETALOL HCL 5 MG/ML IV SOLN: 10.0000 mg | INTRAVENOUS | Status: DC | PRN

## 2024-03-11 NOTE — Plan of Care (Signed)
  Problem: Education: Goal: Ability to describe self-care measures that may prevent or decrease complications (Diabetes Survival Skills Education) will improve Outcome: Progressing Goal: Individualized Educational Video(s) Outcome: Progressing   Problem: Coping: Goal: Ability to adjust to condition or change in health will improve Outcome: Progressing   Problem: Fluid Volume: Goal: Ability to maintain a balanced intake and output will improve Outcome: Progressing   Problem: Health Behavior/Discharge Planning: Goal: Ability to identify and utilize available resources and services will improve Outcome: Progressing Goal: Ability to manage health-related needs will improve Outcome: Progressing   Problem: Metabolic: Goal: Ability to maintain appropriate glucose levels will improve Outcome: Progressing   Problem: Nutritional: Goal: Maintenance of adequate nutrition will improve Outcome: Progressing Goal: Progress toward achieving an optimal weight will improve Outcome: Progressing   Problem: Skin Integrity: Goal: Risk for impaired skin integrity will decrease Outcome: Progressing   Problem: Tissue Perfusion: Goal: Adequacy of tissue perfusion will improve Outcome: Progressing   Problem: Activity: Goal: Ability to tolerate increased activity will improve Outcome: Progressing   Problem: Clinical Measurements: Goal: Ability to maintain a body temperature in the normal range will improve Outcome: Progressing   Problem: Respiratory: Goal: Ability to maintain adequate ventilation will improve Outcome: Progressing Goal: Ability to maintain a clear airway will improve Outcome: Progressing

## 2024-03-11 NOTE — Inpatient Diabetes Management (Signed)
 Inpatient Diabetes Program Recommendations  AACE/ADA: New Consensus Statement on Inpatient Glycemic Control (2015)  Target Ranges:  Prepandial:   less than 140 mg/dL      Peak postprandial:   less than 180 mg/dL (1-2 hours)      Critically ill patients:  140 - 180 mg/dL   Lab Results  Component Value Date   GLUCAP 249 (H) 03/11/2024   HGBA1C 8.5 (A) 11/25/2023    Review of Glycemic Control  Latest Reference Range & Units 03/10/24 23:44 03/11/24 05:49 03/11/24 11:25  Glucose-Capillary 70 - 99 mg/dL 161 (H) 096 (H) 045 (H)   Diabetes history: DM 1 Outpatient Diabetes medications:  Omnipod insulin  pump (per last visit with Dr. Rosalea Collin): Pump   Omnipod Settings   Insulin  type   Novolog     Basal rate          0000 1.20 u/h     1600 1.30                I:C ratio          0000 1:10    1600 1:8                       Sensitivity          0000  35         Goal          0000  120  PUMP STATISTICS from visit on 11/25/23: Average BG: 182 Average Daily Carbs (g): 195.2 Average Total Daily Insulin : 62.5 Average Daily Basal: 39.5 (63 %) Average Daily Bolus:23 (37 %)  Current orders for Inpatient glycemic control:  Novolog  0-9 units q 6 hours  Inpatient Diabetes Program Recommendations:    Spoke with patient by phone.  She states that the MD told the RN to have patient remove insulin  pump last night.   She currently does not have pod/supplies to replace pump at this time.  Explained that she needs basal insulin  plus bolus/meal coverage.  We also discussed that with basal insulin  being given, pump restart should be delayed to 20-24 hours after last dose of basal insulin .  Looks like she was using about 39 units daily basal plus 23 units of bolus/meal coverage at last visit with endocrinologist.   -Due to insulin  pump being off, please consider adding Semglee  20 units now, Novolog  0-6 units q 4 hours, and Novolog  4 units tid with meals (hold if patient eats less than 50% or  NPO).   Thanks,  Josefa Ni, RN, BC-ADM Inpatient Diabetes Coordinator Pager [phone]  (8a-5p)

## 2024-03-11 NOTE — Progress Notes (Signed)
   03/10/24 2313  Assess: MEWS Score  Temp 98.1 F (36.7 C)  BP 134/80  MAP (mmHg) 94  Pulse Rate (!) 111  Resp 20  SpO2 97 %  O2 Device Room Air  Assess: MEWS Score  MEWS Temp 0  MEWS Systolic 0  MEWS Pulse 2  MEWS RR 0  MEWS LOC 0  MEWS Score 2  MEWS Score Color Yellow  Assess: if the MEWS score is Yellow or Red  Were vital signs accurate and taken at a resting state? Yes  Does the patient meet 2 or more of the SIRS criteria? No  MEWS guidelines implemented  Yes, yellow  Treat  MEWS Interventions Considered administering scheduled or prn medications/treatments as ordered  Take Vital Signs  Increase Vital Sign Frequency  Yellow: Q2hr x1, continue Q4hrs until patient remains green for 12hrs  Escalate  MEWS: Escalate Yellow: Discuss with charge nurse and consider notifying provider and/or RRT  Notify: Charge Nurse/RN  Name of Charge Nurse/RN Notified [facility]  Provider Notification  Provider Name/Title Sundil,MD  Date Provider Notified 03/11/24  Time Provider Notified 0003  Method of Notification  (secure chat)  Notification Reason Other (Comment) (Yellow MEWS, HR 111)  Provider response No new orders (PRN order already in epic)  Date of Provider Response 03/11/24  Time of Provider Response 0003  Assess: SIRS CRITERIA  SIRS Temperature  0  SIRS Respirations  0  SIRS Pulse 1  SIRS WBC 0  SIRS Score Sum  1

## 2024-03-11 NOTE — TOC CM/SW Note (Signed)
 Transition of Care [facility]) - Inpatient Brief Assessment   Patient Details  Name: Cathy Green MRN: [id number] Date of Birth: [birth year]  [facility] CM/SW Contact:    Tom-Johnson, Angelique Ken, RN Phone Number: 03/11/2024, 2:08 PM   Clinical Narrative:  Patient presented to the ED with RUQ Abdominal pain and Cough. Chest X-ray showed Lt Lower Lobe Infiltrate. Admitted with Community Acquired Pneumonia, on IV abx.  Patient has hx of DM 1, Diabetic Neuropathy, Diabetic Retinopathy, Vit D Deficiency, CKD V, s/p Kidney Transplant in 2021. On Immunosuppressant.  Plan for Nuclear Medicine Pulmonary Perfusion study to rule out PE.     From home alone, does not have children. Mother and three siblings are supportive. Employed, independent with care. Uses Public transportation and family drives at times. Does not have DME's at home.  PCP is Lawrance Presume, MD and uses [facility].   Patient not Medically ready for discharge.  CM will continue to follow as patient progresses with care towards discharge.            Transition of Care Asessment: Insurance and Status: Insurance coverage has been reviewed Patient has primary care physician: Yes Home environment has been reviewed: Yes Prior level of function:: Independent Prior/Current Home Services: No current home services Social Drivers of Health Review: SDOH reviewed no interventions necessary Readmission risk has been reviewed: Yes Transition of care needs: no transition of care needs at this time

## 2024-03-11 NOTE — Progress Notes (Signed)
 [facility] Progress Note Patient: Cathy Green BJY:[id number] DOB: 08-Dec-1980 DOA: 03/10/2024  DOS: the patient was seen and examined on 03/11/2024  Brief Hospital Course: Patient with PMH of renal transplant, type I DM, CKD, HLD, HTN, diabetic retinopathy and neuropathy, GERD presented to the hospital with complaints of cough ongoing for 3 weeks and new onset right-sided abdominal pain in the flank area. Workup so far is negative for any significant abnormality currently there is concern for pneumonia and patient receiving IV antibiotics.  Assessment and plan Community-acquired pneumonia. Presented with cough ongoing for 3 weeks. Procalcitonin is actually negative. Chest x-ray negative for any acute abnormality.  There was concern for pneumonia at the time of admission. Patient is receiving IV antibiotics right now. Will monitor progression. Aggressively treat cough as that appears to be the primary reason why the patient is here.  Right upper quadrant/lower chest pain. Pain is reproducible especially with breathing. Suspect musculoskeletal pain in the setting of excessive cough. Chest x-ray as well as VQ scan negative for any acute abnormality. Improving with lidocaine patch.  Hyperkalemia. Monitor for now.  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 Patient is on insulin  pump at home.  On admission patient was asked to take the insulin  pump off. Currently diabetes continue to recommending basal bolus regimen which is currently ordered.  On losartan  which I will hold in the setting of cough. Will monitor.  Anemia of chronic kidney disease. H&H stable.  Monitor.  History of renal transplant. For now we will continue home regimen prednisone, Prograf . Recent Prograf  levels were elevated in May 29 but patient did not held her medication prior to the blood draw. Prograf  level prior to that was 6.9.  HTN. On Norvasc  and losartan . Holding  losartan  as above.  Subjective: Continues to have dry cough.  Pain improving.  No fever no chills.  Passing gas.  Physical Exam: General: in Mild distress, No Rash Cardiovascular: S1 and S2 Present, No Murmur Respiratory: Good respiratory effort, Bilateral Air entry present. No Crackles, No wheezes Abdomen: Bowel Sound present, No tenderness Extremities: No edema Neuro: Alert and oriented x3, no new focal deficit  Data Reviewed: I have Reviewed nursing notes, Vitals, and Lab results. Since last encounter, pertinent lab results CBC and BMP   . I have ordered test including CBC and BMP  .   Disposition: Status is: Inpatient Remains inpatient appropriate because: Monitor for improvement in pain control and blood culture clearance.  heparin injection 5,000 Units Start: 03/10/24 2200 SCDs Start: 03/10/24 2144 Place TED hose Start: 03/10/24 2144   Family Communication: No one at bedside Level of care: Telemetry Medical   Vitals:   03/11/24 0140 03/11/24 0548 03/11/24 0727 03/11/24 1645  BP: 132/88 (!) 164/94 (!) 166/102 (!) 141/89  Pulse: (!) 110 (!) 107 (!) 116 100  Resp: 20  18 18   Temp: 98 F (36.7 C) 98.2 F (36.8 C) 98.2 F (36.8 C) 98.1 F (36.7 C)  TempSrc: Oral Oral Oral Oral  SpO2: 95% 97% 98% 94%  Weight:      Height:         Author: Charlean Congress, MD 03/11/2024 6:18 PM  Please look on [email or web address] to find out who is on call.

## 2024-03-12 DIAGNOSIS — J189 Pneumonia, unspecified organism: Secondary | ICD-10-CM | POA: Diagnosis not present

## 2024-03-12 LAB — RENAL FUNCTION PANEL
Albumin: 2.8 g/dL — ABNORMAL LOW (ref 3.5–5.0)
Anion gap: 8 (ref 5–15)
BUN: 24 mg/dL — ABNORMAL HIGH (ref 6–20)
CO2: 20 mmol/L — ABNORMAL LOW (ref 22–32)
Calcium: 8.7 mg/dL — ABNORMAL LOW (ref 8.9–10.3)
Chloride: 108 mmol/L (ref 98–111)
Creatinine, Ser: 1.53 mg/dL — ABNORMAL HIGH (ref 0.44–1.00)
GFR, Estimated: 43 mL/min — ABNORMAL LOW (ref >60)
Glucose, Bld: 168 mg/dL — ABNORMAL HIGH (ref 70–99)
Phosphorus: 2.6 mg/dL (ref 2.5–4.6)
Potassium: 4.3 mmol/L (ref 3.5–5.1)
Sodium: 136 mmol/L (ref 135–145)

## 2024-03-12 LAB — CBC
HCT: 37.2 % (ref 36.0–46.0)
Hemoglobin: 11.5 g/dL — ABNORMAL LOW (ref 12.0–15.0)
MCH: 26.3 pg (ref 26.0–34.0)
MCHC: 30.9 g/dL (ref 30.0–36.0)
MCV: 85.1 fL (ref 80.0–100.0)
Platelets: 277 10*3/uL (ref 150–400)
RBC: 4.37 MIL/uL (ref 3.87–5.11)
RDW: 17.2 % — ABNORMAL HIGH (ref 11.5–15.5)
WBC: 9.3 10*3/uL (ref 4.0–10.5)
nRBC: 0 % (ref 0.0–0.2)

## 2024-03-12 LAB — GLUCOSE, CAPILLARY
Glucose-Capillary: 204 mg/dL — ABNORMAL HIGH (ref 70–99)
Glucose-Capillary: 164 mg/dL — ABNORMAL HIGH (ref 70–99)
Glucose-Capillary: 222 mg/dL — ABNORMAL HIGH (ref 70–99)
Glucose-Capillary: 179 mg/dL — ABNORMAL HIGH (ref 70–99)
Glucose-Capillary: 288 mg/dL — ABNORMAL HIGH (ref 70–99)

## 2024-03-12 LAB — MAGNESIUM: Magnesium: 1.8 mg/dL (ref 1.7–2.4)

## 2024-03-12 MED ORDER — SODIUM CHLORIDE 0.9 % IV SOLN: INTRAVENOUS | Status: DC

## 2024-03-12 MED ORDER — INSULIN GLARGINE-YFGN 100 UNIT/ML ~~LOC~~ SOLN
30.0000 [IU] | Freq: Every day | SUBCUTANEOUS | Status: DC
  Administered 2024-03-13: 30 [IU] via SUBCUTANEOUS
  Filled 2024-03-12: qty 0.3

## 2024-03-12 NOTE — Plan of Care (Signed)
  Problem: Education: Goal: Ability to describe self-care measures that may prevent or decrease complications (Diabetes Survival Skills Education) will improve Outcome: Progressing   Problem: Nutritional: Goal: Maintenance of adequate nutrition will improve Outcome: Progressing   Problem: Respiratory: Goal: Ability to maintain adequate ventilation will improve Outcome: Progressing

## 2024-03-12 NOTE — Hospital Course (Addendum)
 Patient with PMH of renal transplant, type I DM, CKD, HLD, HTN, diabetic retinopathy and neuropathy, GERD presented to the hospital with complaints of cough ongoing for 3 weeks and new onset right-sided abdominal pain in the flank area. Workup so far is negative for any significant abnormality currently there is concern for pneumonia and patient receiving IV antibiotics. Assessment and plan Community-acquired pneumonia. Presented with cough ongoing for 3 weeks. Procalcitonin is actually negative. Chest x-ray negative for any acute abnormality.  There was concern for pneumonia at the time of admission. Patient is receiving IV antibiotics right now. Aggressively treat cough as that appears to be the primary reason why the patient is here. Discussed prednisone versus use of hydrocodone .  Patient has not used this medication yet recommend to use it before initiating prednisone.   Right upper quadrant/lower chest pain. Pain is reproducible especially with breathing. Suspect musculoskeletal pain in the setting of excessive cough. Chest x-ray as well as VQ scan negative for any acute abnormality. Improving with lidocaine patch.   Hyperkalemia. Monitor for now.   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 Patient is on insulin  pump at home.  On admission patient was asked to take the insulin  pump off. Currently diabetes continue to recommending basal bolus regimen which is currently ordered.  On losartan  which I will hold in the setting of cough. Will monitor.   Anemia of chronic kidney disease. H&H stable.  Monitor.   History of renal transplant. CKD 3B For now we will continue home regimen prednisone, Prograf . Recent Prograf  levels were elevated in May 29 but patient did not held her medication prior to the blood draw. Prograf  level prior to that was 6.9. Baseline around function around 1.4-1.5.   HTN. On Norvasc  and losartan . Holding losartan  as  above.  Obesity Class 1 Body mass index is 37.37 kg/m.  Placing the pt at higher risk of poor outcomes.

## 2024-03-12 NOTE — Plan of Care (Signed)
  Problem: Education: Goal: Ability to describe self-care measures that may prevent or decrease complications (Diabetes Survival Skills Education) will improve Outcome: Progressing Goal: Individualized Educational Video(s) Outcome: Progressing   Problem: Coping: Goal: Ability to adjust to condition or change in health will improve Outcome: Progressing   Problem: Fluid Volume: Goal: Ability to maintain a balanced intake and output will improve Outcome: Progressing   Problem: Health Behavior/Discharge Planning: Goal: Ability to identify and utilize available resources and services will improve Outcome: Progressing Goal: Ability to manage health-related needs will improve Outcome: Progressing   Problem: Metabolic: Goal: Ability to maintain appropriate glucose levels will improve Outcome: Progressing   Problem: Nutritional: Goal: Maintenance of adequate nutrition will improve Outcome: Progressing Goal: Progress toward achieving an optimal weight will improve Outcome: Progressing   Problem: Skin Integrity: Goal: Risk for impaired skin integrity will decrease Outcome: Progressing   Problem: Tissue Perfusion: Goal: Adequacy of tissue perfusion will improve Outcome: Progressing   Problem: Activity: Goal: Ability to tolerate increased activity will improve Outcome: Progressing   Problem: Clinical Measurements: Goal: Ability to maintain a body temperature in the normal range will improve Outcome: Progressing   Problem: Respiratory: Goal: Ability to maintain adequate ventilation will improve Outcome: Progressing Goal: Ability to maintain a clear airway will improve Outcome: Progressing

## 2024-03-12 NOTE — Progress Notes (Signed)
 [facility] Progress Note Patient: Cathy Green ZOX:[id number] DOB: Dec 18, 1980 DOA: 03/10/2024  DOS: the patient was seen and examined on 03/12/2024  Brief Hospital Course: Patient with PMH of renal transplant, type I DM, CKD, HLD, HTN, diabetic retinopathy and neuropathy, GERD presented to the hospital with complaints of cough ongoing for 3 weeks and new onset right-sided abdominal pain in the flank area. Workup so far is negative for any significant abnormality currently there is concern for pneumonia and patient receiving IV antibiotics. Assessment and plan Community-acquired pneumonia. Presented with cough ongoing for 3 weeks. Procalcitonin is actually negative. Chest x-ray negative for any acute abnormality.  There was concern for pneumonia at the time of admission. Patient is receiving IV antibiotics right now. Aggressively treat cough as that appears to be the primary reason why the patient is here. Discussed prednisone versus use of hydrocodone .  Patient has not used this medication yet recommend to use it before initiating prednisone.   Right upper quadrant/lower chest pain. Pain is reproducible especially with breathing. Suspect musculoskeletal pain in the setting of excessive cough. Chest x-ray as well as VQ scan negative for any acute abnormality. Improving with lidocaine patch.   Hyperkalemia. Monitor for now.   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 Patient is on insulin  pump at home.  On admission patient was asked to take the insulin  pump off. Currently diabetes continue to recommending basal bolus regimen which is currently ordered.  On losartan  which I will hold in the setting of cough. Will monitor.   Anemia of chronic kidney disease. H&H stable.  Monitor.   History of renal transplant. CKD 3B For now we will continue home regimen prednisone, Prograf . Recent Prograf  levels were elevated in May 29 but patient did not  held her medication prior to the blood draw. Prograf  level prior to that was 6.9. Baseline around function around 1.4-1.5.   HTN. On Norvasc  and losartan . Holding losartan  as above.  Obesity Class 1 Body mass index is 37.37 kg/m.  Placing the pt at higher risk of poor outcomes.    Subjective: No nausea no vomiting.  Ongoing cough.  No chest pain.  No dizziness.  No hypoxia.  Physical Exam: General: in Mild distress, No Rash Cardiovascular: S1 and S2 Present, No Murmur Respiratory: Good respiratory effort, Bilateral Air entry present. No Crackles, No wheezes Abdomen: Bowel Sound present, No tenderness Extremities: No edema Neuro: Alert and oriented x3, no new focal deficit  Data Reviewed: I have Reviewed nursing notes, Vitals, and Lab results. Since last encounter, pertinent lab results CBC and BMP   . I have ordered test including BMP  .   Disposition: Status is: Inpatient Remains inpatient appropriate because: Monitor for improvement in cough and culture clearance  heparin injection 5,000 Units Start: 03/10/24 2200 SCDs Start: 03/10/24 2144 Place TED hose Start: 03/10/24 2144   Family Communication: No one at bedside Level of care: Telemetry Medical switch to MedSurg. Vitals:   03/12/24 0450 03/12/24 0452 03/12/24 0751 03/12/24 1715  BP: 131/82 131/82 (!) 127/92 (!) 141/98  Pulse: 86 86 91 82  Resp: 16  18 18   Temp: 98.3 F (36.8 C) 98.3 F (36.8 C) 97.8 F (36.6 C) 98.4 F (36.9 C)  TempSrc: Oral Oral Oral Oral  SpO2: 97% 97% 99% 100%  Weight:      Height:         Author: Charlean Congress, MD 03/12/2024 6:21 PM  Please look on [email or web address] to find out who  is on call.

## 2024-03-12 NOTE — Inpatient Diabetes Management (Signed)
 Inpatient Diabetes Program Recommendations  AACE/ADA: New Consensus Statement on Inpatient Glycemic Control (2015)  Target Ranges:  Prepandial:   less than 140 mg/dL      Peak postprandial:   less than 180 mg/dL (1-2 hours)      Critically ill patients:  140 - 180 mg/dL   Lab Results  Component Value Date   GLUCAP 164 (H) 03/12/2024   HGBA1C 8.5 (A) 11/25/2023    Review of Glycemic Control  Latest Reference Range & Units 03/11/24 11:25 03/11/24 16:45 03/11/24 19:56 03/11/24 23:57 03/12/24 04:47 03/12/24 07:50  Glucose-Capillary 70 - 99 mg/dL 295 (H) 621 (H) 308 (H) 236 (H) 204 (H) 164 (H)  Diabetes history: DM 1 Outpatient Diabetes medications:  Omnipod insulin  pump (per last visit with Dr. Rosalea Collin): Pump   Omnipod Settings   Insulin  type   Novolog     Basal rate          0000 1.20 u/h     1600 1.30                I:C ratio          0000 1:10    1600 1:8                       Sensitivity          0000  35         Goal          0000  120  PUMP STATISTICS from visit on 11/25/23: Average BG: 182 Average Daily Carbs (g): 195.2 Average Total Daily Insulin : 62.5 Average Daily Basal: 39.5 (63 %) Average Daily Bolus:23 (37 %)   Current orders for Inpatient glycemic control:  Semglee  20 units daily Novolog  4 units tid with meals Novolog  0-6 units q 4 hours Inpatient Diabetes Program Recommendations:    Consider increasing Semglee  to 30 units daily.    Thanks,  Josefa Ni, RN, BC-ADM Inpatient Diabetes Coordinator Pager [phone]  (8a-5p)

## 2024-03-13 ENCOUNTER — Other Ambulatory Visit: Payer: Self-pay

## 2024-03-13 DIAGNOSIS — J189 Pneumonia, unspecified organism: Secondary | ICD-10-CM | POA: Diagnosis not present

## 2024-03-13 LAB — RENAL FUNCTION PANEL
Albumin: 2.6 g/dL — ABNORMAL LOW (ref 3.5–5.0)
Anion gap: 7 (ref 5–15)
BUN: 22 mg/dL — ABNORMAL HIGH (ref 6–20)
CO2: 20 mmol/L — ABNORMAL LOW (ref 22–32)
Calcium: 8.6 mg/dL — ABNORMAL LOW (ref 8.9–10.3)
Chloride: 110 mmol/L (ref 98–111)
Creatinine, Ser: 1.4 mg/dL — ABNORMAL HIGH (ref 0.44–1.00)
GFR, Estimated: 48 mL/min — ABNORMAL LOW (ref >60)
Glucose, Bld: 159 mg/dL — ABNORMAL HIGH (ref 70–99)
Phosphorus: 3.4 mg/dL (ref 2.5–4.6)
Potassium: 4.4 mmol/L (ref 3.5–5.1)
Sodium: 137 mmol/L (ref 135–145)

## 2024-03-13 LAB — MAGNESIUM: Magnesium: 1.7 mg/dL (ref 1.7–2.4)

## 2024-03-13 LAB — GLUCOSE, CAPILLARY
Glucose-Capillary: 114 mg/dL — ABNORMAL HIGH (ref 70–99)
Glucose-Capillary: 149 mg/dL — ABNORMAL HIGH (ref 70–99)
Glucose-Capillary: 167 mg/dL — ABNORMAL HIGH (ref 70–99)
Glucose-Capillary: 245 mg/dL — ABNORMAL HIGH (ref 70–99)

## 2024-03-13 MED ORDER — CEFADROXIL 500 MG PO CAPS
500.0000 mg | ORAL_CAPSULE | Freq: Two times a day (BID) | ORAL | 0 refills | Status: AC
  Filled 2024-03-13: qty 14, 7d supply, fill #0

## 2024-03-13 MED ORDER — SODIUM CHLORIDE 0.9% FLUSH: 10.0000 mL | INTRAVENOUS | Status: DC | PRN

## 2024-03-13 MED ORDER — HYDROCODONE BIT-HOMATROP MBR 5-1.5 MG/5ML PO SOLN
5.0000 mL | ORAL | 0 refills | Status: DC | PRN
  Filled 2024-03-13: qty 120, 4d supply, fill #0

## 2024-03-13 MED ORDER — DOXYCYCLINE HYCLATE 100 MG PO TABS
100.0000 mg | ORAL_TABLET | Freq: Two times a day (BID) | ORAL | 0 refills | Status: AC
  Filled 2024-03-13: qty 14, 7d supply, fill #0

## 2024-03-13 MED ORDER — PANTOPRAZOLE SODIUM 40 MG PO TBEC: DELAYED_RELEASE_TABLET | ORAL | Status: DC

## 2024-03-13 MED ORDER — METOPROLOL TARTRATE 25 MG PO TABS
12.5000 mg | ORAL_TABLET | Freq: Two times a day (BID) | ORAL | 0 refills | Status: DC
  Filled 2024-03-13: qty 30, 30d supply, fill #0

## 2024-03-13 MED ORDER — GUAIFENESIN ER 600 MG PO TB12
600.0000 mg | ORAL_TABLET | Freq: Two times a day (BID) | ORAL | 0 refills | Status: AC
  Filled 2024-03-13: qty 28, 14d supply, fill #0

## 2024-03-13 MED ORDER — DEXTROMETHORPHAN POLISTIREX ER 30 MG/5ML PO SUER
30.0000 mg | Freq: Two times a day (BID) | ORAL | 0 refills | Status: AC
  Filled 2024-03-13: qty 89, 9d supply, fill #0

## 2024-03-13 NOTE — Plan of Care (Signed)
  Problem: Education: Goal: Ability to describe self-care measures that may prevent or decrease complications (Diabetes Survival Skills Education) will improve Outcome: Adequate for Discharge Goal: Individualized Educational Video(s) Outcome: Adequate for Discharge   Problem: Coping: Goal: Ability to adjust to condition or change in health will improve Outcome: Adequate for Discharge   Problem: Fluid Volume: Goal: Ability to maintain a balanced intake and output will improve Outcome: Adequate for Discharge   Problem: Health Behavior/Discharge Planning: Goal: Ability to identify and utilize available resources and services will improve Outcome: Adequate for Discharge Goal: Ability to manage health-related needs will improve Outcome: Adequate for Discharge   Problem: Metabolic: Goal: Ability to maintain appropriate glucose levels will improve Outcome: Adequate for Discharge   Problem: Nutritional: Goal: Maintenance of adequate nutrition will improve Outcome: Adequate for Discharge Goal: Progress toward achieving an optimal weight will improve Outcome: Adequate for Discharge   Problem: Skin Integrity: Goal: Risk for impaired skin integrity will decrease Outcome: Adequate for Discharge   Problem: Tissue Perfusion: Goal: Adequacy of tissue perfusion will improve Outcome: Adequate for Discharge   Problem: Activity: Goal: Ability to tolerate increased activity will improve Outcome: Adequate for Discharge   Problem: Clinical Measurements: Goal: Ability to maintain a body temperature in the normal range will improve Outcome: Adequate for Discharge   Problem: Respiratory: Goal: Ability to maintain adequate ventilation will improve Outcome: Adequate for Discharge Goal: Ability to maintain a clear airway will improve Outcome: Adequate for Discharge

## 2024-03-13 NOTE — TOC Transition Note (Signed)
 Transition of Care [facility]) - Discharge Note   Patient Details  Name: Cathy Green MRN: [id number] Date of Birth: 1980/10/19  Transition of [facility]) CM/SW Contact:  Tom-Johnson, Angelique Ken, RN Phone Number: 03/13/2024, 2:47 PM   Clinical Narrative:     Patient is scheduled for discharge today.  Readmission Risk Assessment done. Outpatient f/u, hospital f/u and discharge instructions on AVS. No TOC needs or recommendations noted. Family to transport at discharge.  No further TOC needs noted.       Final next level of care: Home/Self Care Barriers to Discharge: Barriers Resolved   Patient Goals and CMS Choice Patient states their goals for this hospitalization and ongoing recovery are:: To return home CMS Medicare.gov Compare Post Acute Care list provided to:: Patient Choice offered to / list presented to : NA      Discharge Placement                Patient to be transferred to facility by: Family      Discharge Plan and Services Additional resources added to the After Visit Summary for                  DME Arranged: N/A DME Agency: NA       HH Arranged: NA HH Agency: NA        Social Drivers of Health (SDOH) Interventions SDOH Screenings   Food Insecurity: No Food Insecurity (03/11/2024)  Housing: Low Risk  (03/11/2024)  Transportation Needs: No Transportation Needs (03/11/2024)  Utilities: Not At Risk (03/11/2024)  Alcohol Screen: Low Risk  (12/06/2023)  Depression (PHQ2-9): Low Risk  (01/16/2024)  Financial Resource Strain: Low Risk  (12/06/2023)  Physical Activity: Sufficiently Active (12/06/2023)  Social Connections: Socially Isolated (12/06/2023)  Stress: No Stress Concern Present (12/06/2023)  Tobacco Use: Low Risk  (03/10/2024)  Health Literacy: Adequate Health Literacy (08/07/2023)     Readmission Risk Interventions    03/11/2024    2:01 PM  Readmission Risk Prevention Plan  Transportation Screening Complete  PCP or Specialist Appt within  5-7 Days Complete  Home Care Screening Complete  Medication Review (RN CM) Referral to Pharmacy

## 2024-03-13 NOTE — Discharge Summary (Signed)
 Physician Discharge Summary   Patient: Cathy Green MRN: [id number] DOB: 31-Oct-1980  Admit date:     03/10/2024  Discharge date: {dischdate:26783}  Discharge Physician: Charlean Congress  PCP: Lawrance Presume, MD  Recommendations at discharge:  ***   { Unresulted Labs (From admission, onward)     Start     Ordered   03/12/24 0500  Magnesium   Daily,   R     Question:  Specimen collection method  Answer:  Lab=Lab collect   03/11/24 2251   03/12/24 0500  Renal function panel  Daily,   R     Question:  Specimen collection method  Answer:  Lab=Lab collect   03/11/24 2251   03/10/24 2131  Expectorated Sputum Assessment w Gram Stain, Rflx to Resp Cult  Once,   R       Question Answer Comment  Patient immune status Immunocompromised   Release to patient Immediate      03/10/24 2130           (Optional):1} Discharge Diagnoses: Principal Problem:   CAP (community acquired pneumonia) Active Problems:   Hyperkalemia   History of renal transplant   Hyperlipidemia   Diabetic retinopathy (HCC)   Insulin  dependent type 1 diabetes mellitus (HCC)   Chronic kidney disease (CKD), stage III (moderate) (HCC)   Diabetic neuropathy (HCC)   Anemia due to chronic kidney disease  [facility]  Hospital Course: Patient with PMH of renal transplant, type I DM, CKD, HLD, HTN, diabetic retinopathy and neuropathy, GERD presented to the hospital with complaints of cough ongoing for 3 weeks and new onset right-sided abdominal pain in the flank area. Workup so far is negative for any significant abnormality currently there is concern for pneumonia and patient receiving IV antibiotics. Assessment and plan Community-acquired pneumonia. Presented with cough ongoing for 3 weeks. Procalcitonin is actually negative. Chest x-ray negative for any acute abnormality.  There was concern for pneumonia at the time of admission. Patient is receiving IV antibiotics right now. Aggressively treat  cough as that appears to be the primary reason why the patient is here. Discussed prednisone  versus use of hydrocodone .  Patient has not used this medication yet recommend to use it before initiating prednisone .   Right upper quadrant/lower chest pain. Pain is reproducible especially with breathing. Suspect musculoskeletal pain in the setting of excessive cough. Chest x-ray as well as VQ scan negative for any acute abnormality. Improving with lidocaine  patch.   Hyperkalemia. Monitor for now.   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 Patient is on insulin  pump at home.  On admission patient was asked to take the insulin  pump off. Currently diabetes continue to recommending basal bolus regimen which is currently ordered.  On losartan  which I will hold in the setting of cough. Will monitor.   Anemia of chronic kidney disease. H&H stable.  Monitor.   History of renal transplant. CKD 3B For now we will continue home regimen prednisone , Prograf . Recent Prograf  levels were elevated in May 29 but patient did not held her medication prior to the blood draw. Prograf  level prior to that was 6.9. Baseline around function around 1.4-1.5.   HTN. On Norvasc  and losartan . Holding losartan  as above.  Obesity Class 1 Body mass index is 37.37 kg/m.  Placing the pt at higher risk of poor outcomes.   {Tip this will not be part of the note when signed Body mass index is 37.37 kg/m. , ,  (Optional):26781}  {(NOTE)  Pain control PDMP Statment (Optional):26782} Consultants:  ***  Procedures performed:  ***  DISCHARGE MEDICATION: Allergies as of 03/13/2024       Reactions   Garlic Nausea And Vomiting        Medication List     STOP taking these medications    losartan  25 MG tablet Commonly known as: Cozaar        TAKE these medications    amLODipine  5 MG tablet Commonly known as: NORVASC  Take 1.5 tablets (7.5 mg total) by mouth daily.    atorvastatin  20 MG tablet Commonly known as: LIPITOR Take 1 tablet (20 mg total) by mouth daily.   azaTHIOprine  50 MG tablet Commonly known as: IMURAN  Take 100 mg by mouth daily.   benzonatate  200 MG capsule Commonly known as: TESSALON  Take 1 capsule (200 mg total) by mouth 2 (two) times daily as needed for cough.   cefadroxil 500 MG capsule Commonly known as: DURICEF Take 1 capsule (500 mg total) by mouth 2 (two) times daily for 7 days.   TRUE METRIX BLOOD GLUCOSE TEST VI   Contour Next Test test strip Generic drug: glucose blood Use as instructed to check blood sugar TID.   cyanocobalamin  1000 MCG tablet Take by mouth.   Dexcom G7 Sensor Misc Change sensore every 10 days.   dextromethorphan  30 MG/5ML liquid Commonly known as: DELSYM  Take 5 mLs (30 mg total) by mouth 2 (two) times daily.   doxycycline  100 MG tablet Commonly known as: VIBRA -TABS Take 1 tablet (100 mg total) by mouth 2 (two) times daily for 7 days.   gabapentin  300 MG capsule Commonly known as: NEURONTIN  Take 2 capsules (600 mg total) by mouth 2 (two) times daily. Needs to be seen prior to next refill request.   guaiFENesin  600 MG 12 hr tablet Commonly known as: MUCINEX  Take 1 tablet (600 mg total) by mouth 2 (two) times daily for 14 days.   HYDROcodone  bit-homatropine 5-1.5 MG/5ML syrup Commonly known as: HYCODAN Take 5 mLs by mouth every 4 (four) hours as needed for cough.   metoprolol  tartrate 25 MG tablet Commonly known as: LOPRESSOR  Take 0.5 tablets (12.5 mg total) by mouth 2 (two) times daily.   nortriptyline  25 MG capsule Commonly known as: PAMELOR  Take 1 capsule (25 mg total) by mouth at bedtime.   NovoLOG  100 UNIT/ML injection Generic drug: insulin  aspart Inject 80 units max daily dose. Use with pump   Omnipod 5 DexG7G6 Pods Gen 5 Misc Use as directed and change pod every 3 days   pantoprazole  40 MG tablet Commonly known as: PROTONIX  Take 1 tablet (40 mg total) by mouth 2  (two) times daily before a meal for 7 days, THEN 1 tablet (40 mg total) daily. Start taking on: March 13, 2024 What changed: See the new instructions.   predniSONE  5 MG tablet Commonly known as: DELTASONE  Take 5 mg by mouth daily with breakfast.   rizatriptan  10 MG disintegrating tablet Commonly known as: MAXALT -MLT Take 1 tablet (10 mg total) by mouth as needed for migraine. May repeat in 2 hours if needed.  Maximum 2 tablets in 24 hours.   Simbrinza  1-0.2 % Susp Generic drug: Brinzolamide -Brimonidine  Instill 1 drop into both eyes twice a day   tacrolimus  1 MG capsule Commonly known as: PROGRAF  Take 1 mg by mouth 2 (two) times daily.   Vitamin D  (Ergocalciferol ) 1.25 MG (50000 UNIT) Caps capsule Commonly known as: DRISDOL  Take 1 capsule (50,000 Units total) by mouth once a week.  Disposition: {comingfrom:22515} Diet recommendation: {dietplan:22518}  Discharge Exam: Vitals:   03/12/24 1715 03/12/24 2106 03/13/24 0402 03/13/24 0825  BP: (!) 141/98 121/77 127/85 114/74  Pulse: 82 88 73 83  Resp: 18 20 17 18   Temp: 98.4 F (36.9 C) 97.7 F (36.5 C) 98.1 F (36.7 C) 98.2 F (36.8 C)  TempSrc: Oral Oral Oral   SpO2: 100% 99% 97% 97%  Weight:      Height:       General: Appear in {DEGREE - MILD, MOD, SEV:22033} distress; no visible Abnormal Neck Mass Or lumps, Conjunctiva normal Cardiovascular: S1 and S2 Present, *** Murmur, Respiratory: {Desc; increased/descreased:10091} respiratory effort, Bilateral Air entry present and ***CTA, *** Crackles, *** wheezes Abdomen: Bowel Sound present, *** Extremities: *** Pedal edema Neurology: {EXAM; NEURO ALERTNESS:23097} and {orientationexam:27336} *** Filed Weights   03/10/24 1530 03/10/24 2319  Weight: 90.3 kg 89.7 kg   Condition at discharge: stable  The results of significant diagnostics from this hospitalization (including imaging, microbiology, ancillary and laboratory) are listed below for reference.   Imaging  Studies: NM Pulmonary Perfusion Result Date: 03/11/2024 CLINICAL DATA:  Pain and cough EXAM: NUCLEAR MEDICINE PERFUSION LUNG SCAN TECHNIQUE: Perfusion images were obtained in multiple projections after intravenous injection of radiopharmaceutical. Ventilation scans intentionally deferred if perfusion scan and chest x-ray adequate for interpretation during COVID 19 epidemic. RADIOPHARMACEUTICALS:  4.4 mCi Tc-2m MAA IV COMPARISON:  Two-view chest x-ray 03/10/2024 FINDINGS: No significant perfusion defects. Symmetric distribution radiotracer on perfusion imaging. Chest x-ray had clear lungs from 03/10/2024. IMPRESSION: Negative perfusion lung scan for pulmonary embolism. Electronically Signed   By: Adrianna Horde M.D.   On: 03/11/2024 12:12   US  Abdomen Limited Result Date: 03/10/2024 CLINICAL DATA:  Right upper quadrant pain.  Cough EXAM: ULTRASOUND ABDOMEN LIMITED RIGHT UPPER QUADRANT COMPARISON:  None Available. FINDINGS: Gallbladder: Gallbladder is mildly distended. No wall thickening or adjacent fluid. No reported Murphy's sign. No shadowing stones or sludge identified this time. Common bile duct: Diameter: 4 mm Liver: No focal lesion identified. Within normal limits in parenchymal echogenicity. Portal vein is patent on color Doppler imaging with normal direction of blood flow towards the liver. Other: Evaluation limited by overlapping bowel gas and soft tissue. IMPRESSION: No gallstones or ductal dilatation. Electronically Signed   By: Adrianna Horde M.D.   On: 03/10/2024 16:29   DG Chest 2 View Result Date: 03/10/2024 CLINICAL DATA:  Right upper quadrant pain and cough EXAM: CHEST - 2 VIEW COMPARISON:  November 03, 2018 FINDINGS: Ill-defined left lower lobe retrocardiac infiltrates. Right lung clear Heart and mediastinum normal IMPRESSION: Left lower lobe retrocardiac infiltrates Electronically Signed   By: Fredrich Jefferson M.D.   On: 03/10/2024 16:09    Microbiology: Results for orders placed or performed  during the hospital encounter of 03/10/24  Respiratory (~20 pathogens) panel by PCR     Status: None   Collection Time: 03/10/24 11:57 PM   Specimen: Nasopharyngeal Swab; Respiratory  Result Value Ref Range Status   Adenovirus NOT DETECTED NOT DETECTED Final   Coronavirus 229E NOT DETECTED NOT DETECTED Final    Comment: (NOTE) The Coronavirus on the Respiratory Panel, DOES NOT test for the novel  Coronavirus (2019 nCoV)    Coronavirus HKU1 NOT DETECTED NOT DETECTED Final   Coronavirus NL63 NOT DETECTED NOT DETECTED Final   Coronavirus OC43 NOT DETECTED NOT DETECTED Final   Metapneumovirus NOT DETECTED NOT DETECTED Final   Rhinovirus / Enterovirus NOT DETECTED NOT DETECTED Final   Influenza A NOT  DETECTED NOT DETECTED Final   Influenza B NOT DETECTED NOT DETECTED Final   Parainfluenza Virus 1 NOT DETECTED NOT DETECTED Final   Parainfluenza Virus 2 NOT DETECTED NOT DETECTED Final   Parainfluenza Virus 3 NOT DETECTED NOT DETECTED Final   Parainfluenza Virus 4 NOT DETECTED NOT DETECTED Final   Respiratory Syncytial Virus NOT DETECTED NOT DETECTED Final   Bordetella pertussis NOT DETECTED NOT DETECTED Final   Bordetella Parapertussis NOT DETECTED NOT DETECTED Final   Chlamydophila pneumoniae NOT DETECTED NOT DETECTED Final   Mycoplasma pneumoniae NOT DETECTED NOT DETECTED Final    Comment: Performed at [facility], [address] [place], [place] [address]  Culture, blood (Routine X 2) w Reflex to ID Panel     Status: None (Preliminary result)   Collection Time: 03/11/24  5:33 AM   Specimen: BLOOD LEFT ARM  Result Value Ref Range Status   Specimen Description BLOOD LEFT ARM  Final   Special Requests   Final    BOTTLES DRAWN AEROBIC AND ANAEROBIC Blood Culture adequate volume   Culture   Final    NO GROWTH 2 DAYS Performed at [facility], [address] [place], [place] [address]    Report Status PENDING  Incomplete  Culture, blood (Routine X 2) w Reflex to ID Panel      Status: None (Preliminary result)   Collection Time: 03/11/24  5:33 AM   Specimen: BLOOD LEFT HAND  Result Value Ref Range Status   Specimen Description BLOOD LEFT HAND  Final   Special Requests   Final    BOTTLES DRAWN AEROBIC AND ANAEROBIC Blood Culture results may not be optimal due to an inadequate volume of blood received in culture bottles   Culture   Final    NO GROWTH 2 DAYS Performed at [facility], [address] [place], [place] [address]    Report Status PENDING  Incomplete   Labs: CBC: Recent Labs  Lab 03/10/24 1538 03/11/24 0533 03/12/24 0702  WBC 6.4 9.1 9.3  NEUTROABS 4.5  --   --   HGB 11.2* 11.6* 11.5*  HCT 37.1 37.4 37.2  MCV 87.5 84.2 85.1  PLT 264 262 277   Basic Metabolic Panel: Recent Labs  Lab 03/10/24 1538 03/11/24 0533 03/12/24 0702 03/13/24 0534  NA 137 134* 136 137  K 5.4* 5.2* 4.3 4.4  CL 107 110 108 110  CO2 22 19* 20* 20*  GLUCOSE 181* 235* 168* 159*  BUN 26* 21* 24* 22*  CREATININE 1.84* 1.37* 1.53* 1.40*  CALCIUM  9.0 9.2 8.7* 8.6*  MG  --   --  1.8 1.7  PHOS  --   --  2.6 3.4   Liver Function Tests: Recent Labs  Lab 03/10/24 1538 03/11/24 0533 03/12/24 0702 03/13/24 0534  AST 26 17  --   --   ALT 52* 45*  --   --   ALKPHOS 80 77  --   --   BILITOT 0.6 0.6  --   --   PROT 7.3 7.2  --   --   ALBUMIN  3.2* 2.9* 2.8* 2.6*   CBG: Recent Labs  Lab 03/12/24 2022 03/13/24 0000 03/13/24 0401 03/13/24 0801 03/13/24 1140  GLUCAP 288* 245* 167* 149* 114*    Discharge time spent: {LESS THAN/GREATER THAN:26388} 30 minutes.  Author: Charlean Congress, MD  [facility] 03/13/2024

## 2024-03-16 ENCOUNTER — Other Ambulatory Visit: Payer: Self-pay

## 2024-03-16 ENCOUNTER — Ambulatory Visit (INDEPENDENT_AMBULATORY_CARE_PROVIDER_SITE_OTHER): Admitting: Obstetrics and Gynecology

## 2024-03-16 ENCOUNTER — Encounter: Payer: Self-pay | Admitting: Obstetrics and Gynecology

## 2024-03-16 ENCOUNTER — Other Ambulatory Visit (HOSPITAL_COMMUNITY)
Admission: RE | Admit: 2024-03-16 | Discharge: 2024-03-16 | Disposition: A | Discharge status: Home / Self Care | Source: Ambulatory Visit | Attending: Obstetrics and Gynecology | Admitting: Obstetrics and Gynecology

## 2024-03-16 ENCOUNTER — Telehealth: Payer: Self-pay

## 2024-03-16 VITALS — BP 148/88 | HR 83 | Wt 198.3 lb

## 2024-03-16 DIAGNOSIS — R87622 Low grade squamous intraepithelial lesion on cytologic smear of vagina (LGSIL): Secondary | ICD-10-CM

## 2024-03-16 DIAGNOSIS — R8789 Other abnormal findings in specimens from female genital organs: Secondary | ICD-10-CM | POA: Diagnosis not present

## 2024-03-16 DIAGNOSIS — N870 Mild cervical dysplasia: Secondary | ICD-10-CM | POA: Diagnosis not present

## 2024-03-16 LAB — CULTURE, BLOOD (ROUTINE X 2)
Culture: NO GROWTH
Culture: NO GROWTH
Special Requests: ADEQUATE

## 2024-03-16 NOTE — Transitions of Care (Post Inpatient/ED Visit) (Signed)
   03/16/2024  Name: Cathy Green MRN: [id number] DOB: 1981/06/16  Today's TOC FU Call Status: Today's TOC FU Call Status:: Successful TOC FU Call Completed TOC FU Call Complete Date: 03/16/24 Patient's Name and Date of Birth confirmed.  Transition Care Management Follow-up Telephone Call Date of Discharge: 03/13/24 Discharge Facility: [facility]) Type of Discharge: Inpatient Admission Primary Inpatient Discharge Diagnosis:: CAP How have you been since you were released from the hospital?: Better (She said she feels alot better but continues to have a slight cough) Any questions or concerns?: No  Items Reviewed: Did you receive and understand the discharge instructions provided?: Yes Medications obtained,verified, and reconciled?: No Medications Not Reviewed Reasons:: Other: (She said she has all of her medications and did not have any questions about the med regime. She said her questions were answered before she was discharged. She also has CGM, standard glucometer and omnipod.) Any new allergies since your discharge?: No Dietary orders reviewed?: Yes Type of Diet Ordered:: heart healthy, carb modified Do you have support at home?: Yes People in Home [RPT]: alone  Medications Reviewed Today: Medications Reviewed Today   Medications were not reviewed in this encounter     Home Care and Equipment/Supplies: Were Home Health Services Ordered?: No Any new equipment or medical supplies ordered?: No  Functional Questionnaire: Do you need assistance with bathing/showering or dressing?: No Do you need assistance with meal preparation?: No Do you need assistance with eating?: No Do you have difficulty maintaining continence: No Do you need assistance with getting out of bed/getting out of a chair/moving?: No Do you have difficulty managing or taking your medications?: No  Follow up appointments reviewed: PCP Follow-up appointment confirmed?: Yes Date of PCP follow-up  appointment?: 04/30/24 Follow-up Provider: [facility] Follow-up appointment confirmed?: Yes Date of Specialist follow-up appointment?: 03/24/24 Follow-Up Specialty Provider:: endocrinology,  03/16/2024- OB/GYN;   04/29/2024- neurology, Do you need transportation to your follow-up appointment?: No Do you understand care options if your condition(s) worsen?: Yes-patient verbalized understanding    SIGNATURE Burnett Carson, RN

## 2024-03-16 NOTE — Progress Notes (Signed)
    GYNECOLOGY OFFICE COLPOSCOPY PROCEDURE NOTE  43 y.o. No obstetric history on file. here for colposcopy for low-grade squamous intraepithelial neoplasia (LGSIL - encompassing HPV,mild dysplasia,CIN I) with HR HPV pap smear on 01/16/24. Discussed role for HPV in cervical dysplasia, need for surveillance.  Patient gave informed written consent, time out was performed.  Placed in lithotomy position. Cervix viewed with speculum and colposcope after application of acetic acid.   Colposcopy adequate? Yes  acetowhite lesion(s) noted at 1 o'clock; corresponding biopsies obtained.  ECC specimen obtained. All specimens were labeled and sent to pathology.  Chaperone was present during entire procedure.  Patient was given post procedure instructions.  Will follow up pathology and manage accordingly; patient will be contacted with results and recommendations.  Routine preventative health maintenance measures emphasized.   Kiki Pelton, MD, FACOG Minimally Invasive Gynecologic Surgery  Obstetrics and Gynecology, [facility] for [facility], [facility] 03/16/2024

## 2024-03-16 NOTE — Patient Instructions (Signed)
 Colposcopy, Care After  The following information offers guidance on how to care for yourself after your procedure. Your doctor may also give you more specific instructions. If you have problems or questions, contact your doctor. What can I expect after the procedure? If you did not have a sample of your tissue taken out (did not have a biopsy), you may only have some spotting of blood for a few days. You can go back to your normal activities. If you had a sample of your tissue taken out, it is common to have: Soreness and mild pain. These may last for a few days. Mild bleeding or fluid (discharge) coming from your vagina. The fluid will look dark and grainy. You may have this for a few days. The fluid may be caused by a liquid that was used during your procedure. You may need to wear a sanitary pad. Spotting of blood for at least 48 hours after the procedure. Follow these instructions at home: Medicines Take over-the-counter and prescription medicines only as told by your doctor. Ask your doctor what over-the-counter pain medicines and prescription medicines you can start taking again. This is very important if you take blood thinners. Activity For at least 3 days, or for as long as told by your doctor, avoid: Douching. Using tampons. Having sex. Return to your normal activities as told by your doctor. Ask your doctor what activities are safe for you. General instructions Ask your doctor if you may take baths, swim, or use a hot tub. You may take showers. If you use birth control (contraception), keep using it. Keep all follow-up visits. Contact a doctor if: You have a fever or chills. You faint or feel light-headed. Get help right away if: You bleed a lot from your vagina. A lot of bleeding means that the bleeding soaks through a pad in less than 1 hour. You have clumps of blood (blood clots) coming from your vagina. You have signs that could mean you have an infection. This may be  fluid coming from your vagina that is: Different than normal. Yellow. Bad-smelling. You have very bad pain or cramps in your lower belly that do not get better with medicine. Summary If you did not have a sample of your tissue taken out, you may only have some spotting of blood for a few days. You can go back to your normal activities. If you had a sample of your tissue taken out, it is common to have mild pain for a few days and spotting for 48 hours. Avoid douching, using tampons, and having sex for at least 3 days after the procedure or for as long as told. Get help right away if you have a lot of bleeding, very bad pain, or signs of infection. This information is not intended to replace advice given to you by your health care provider. Make sure you discuss any questions you have with your health care provider. Document Revised: 02/12/2021 Document Reviewed: 02/12/2021 Elsevier Patient Education  2024 [employer].

## 2024-03-16 NOTE — Addendum Note (Signed)
 Addended by: Georgiann Neider O on: 03/16/2024 10:50 AM   Modules accepted: Orders

## 2024-03-17 DIAGNOSIS — Z3202 Encounter for pregnancy test, result negative: Secondary | ICD-10-CM

## 2024-03-17 LAB — POCT PREGNANCY, URINE: Preg Test, Ur: NEGATIVE

## 2024-03-17 LAB — SURGICAL PATHOLOGY

## 2024-03-20 ENCOUNTER — Ambulatory Visit: Payer: Self-pay | Admitting: Obstetrics and Gynecology

## 2024-03-24 ENCOUNTER — Encounter: Payer: Self-pay | Admitting: Internal Medicine

## 2024-03-24 ENCOUNTER — Other Ambulatory Visit: Payer: Self-pay

## 2024-03-24 ENCOUNTER — Ambulatory Visit (INDEPENDENT_AMBULATORY_CARE_PROVIDER_SITE_OTHER): Payer: Medicaid Other | Admitting: Internal Medicine

## 2024-03-24 VITALS — BP 126/84 | HR 85 | Ht 61.0 in | Wt 193.6 lb

## 2024-03-24 DIAGNOSIS — E1065 Type 1 diabetes mellitus with hyperglycemia: Secondary | ICD-10-CM

## 2024-03-24 DIAGNOSIS — E785 Hyperlipidemia, unspecified: Secondary | ICD-10-CM | POA: Diagnosis not present

## 2024-03-24 DIAGNOSIS — E1069 Type 1 diabetes mellitus with other specified complication: Secondary | ICD-10-CM | POA: Diagnosis not present

## 2024-03-24 DIAGNOSIS — E103413 Type 1 diabetes mellitus with severe nonproliferative diabetic retinopathy with macular edema, bilateral: Secondary | ICD-10-CM | POA: Diagnosis not present

## 2024-03-24 DIAGNOSIS — E1042 Type 1 diabetes mellitus with diabetic polyneuropathy: Secondary | ICD-10-CM

## 2024-03-24 DIAGNOSIS — Z940 Kidney transplant status: Secondary | ICD-10-CM | POA: Diagnosis not present

## 2024-03-24 LAB — POCT GLYCOSYLATED HEMOGLOBIN (HGB A1C): Hemoglobin A1C: 8.2 % — AB (ref 4.0–5.6)

## 2024-03-24 MED ORDER — DEXCOM G7 SENSOR MISC
1.0000 | 3 refills | Status: AC
  Filled 2024-03-24 – 2024-05-22 (×2): qty 9, 90d supply, fill #0
  Filled 2024-08-18: qty 9, 90d supply, fill #1

## 2024-03-24 MED ORDER — OMNIPOD 5 DEXG7G6 PODS GEN 5 MISC
1.0000 | 3 refills | Status: AC
  Filled 2024-03-24: qty 30, 90d supply, fill #0
  Filled 2024-06-21: qty 30, 90d supply, fill #1
  Filled 2024-09-06: qty 30, 90d supply, fill #2

## 2024-03-24 MED ORDER — ATORVASTATIN CALCIUM 20 MG PO TABS
20.0000 mg | ORAL_TABLET | Freq: Every day | ORAL | 3 refills | Status: AC
  Filled 2024-03-24: qty 90, 90d supply, fill #0
  Filled 2024-06-21: qty 90, 90d supply, fill #1
  Filled 2024-09-21: qty 90, 90d supply, fill #2

## 2024-03-24 MED ORDER — INSULIN ASPART 100 UNIT/ML IJ SOLN
INTRAMUSCULAR | 11 refills | Status: AC
  Filled 2024-03-24: qty 30, 37d supply, fill #0
  Filled 2024-04-20: qty 30, 37d supply, fill #1
  Filled 2024-06-21: qty 30, 37d supply, fill #2
  Filled 2024-07-28: qty 30, 37d supply, fill #3
  Filled 2024-09-01: qty 30, 37d supply, fill #4
  Filled 2024-10-14: qty 30, 37d supply, fill #5

## 2024-03-24 NOTE — Progress Notes (Signed)
 Name: Cathy Green  Age/ Sex: 43 y.o., female   MRN/ DOB: [id number], 01/21/1981     PCP: Vicci Barnie NOVAK, MD   Reason for Endocrinology Evaluation: Type 1 Diabetes Mellitus  Initial Endocrine Consultative Visit: 11/14/2020    PATIENT IDENTIFIER: Cathy Green is a 43 y.o. female with a past medical history of T1DM , HTN and Dyslipidemia and hx of renal transplant. The patient has followed with Endocrinology clinic since 11/14/2020 for consultative assistance with management of her diabetes.  DIABETIC HISTORY:  Cathy Green was diagnosed with DM at age 31, Metformin caused GI side effects .SABRA Her hemoglobin A1c has ranged from 6.2% in 01/2020, peaking at 8.1% in 08/2020.  S/P renal transplant 06/2020- Through [facility]  No hx of dialysis    On her initial visit to our clinic she had an A1c of 6.5 %, we adjusted MDI regimen and provided her with correction scale  She was started on OmniPod in July 2023   SUBJECTIVE:   During the last visit (11/25/2023): A1c 8.5%     Today (03/24/2024): Cathy Green is here for a follow up on diabetes.  She has not been using the dexcom because she thre    She continues to follow-up with kidney transplant team, due to history of renal transplant 2021, she continues to follow-up with local nephrologist  She was recently treated for community-acquired pneumonia 03/10/2024   Continues with cough but no SOB  Denies nausea or vomiting   Denies constipation but has minimal diarrhea    This patient with type 1 diabetes is treated with Omnipod (insulin  pump). During the visit the pump basal and bolus doses were reviewed including carb/insulin  rations and supplemental doses. The clinical list was updated. The glucose meter download was reviewed in detail to determine if the current pump settings are providing the best glycemic control without excessive hypoglycemia.  Pump and meter download:      Pump   Omnipod Settings   Insulin  type   Novolog     Basal rate       0000 1.20 u/h    1600 1.30          I:C ratio       0000 1:7   1600 1:7              Sensitivity       0000  30      Goal       0000  120           Type & Model of Pump: Omnipod Insulin  Type: Currently using Novolog  .  PUMP STATISTICS: Average BG: 193 Average Daily Carbs (g): 87.9 Average Total Daily Insulin : 55.2 Average Daily Basal: 39.6( 72 %) Average Daily Bolus:15.7(28 %)    HOME DIABETES REGIMEN:  Novolog       Statin: yes ACE-I/ARB: no Prior Diabetic Education: yes    CONTINUOUS GLUCOSE MONITORING RECORD INTERPRETATION    Dates of Recording: 6/11-6/24/2025  Sensor description: Dexcom  Results statistics:   CGM use % of time 65.2  Average and SD 193/71  Time in range 51%  % Time Above 180 28  % Time above 250 21  % Time Below target 0   Glycemic patterns summary: BG's trend down overnight and fluctuate during the day  Hyperglycemic episodes postprandial  Hypoglycemic episodes occurred N/A  Overnight periods: Variable      DIABETIC COMPLICATIONS: Microvascular complications:  Neuropathy, CKD (S/P transplant 2021), retinopathy ( S/P injections) Last eye  exam: Completed 01/21/2024   Macrovascular complications:   Denies: CAD, CVA, PVD   HISTORY:  Past Medical History:  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   Diabetes mellitus    diagnosed age 68   Diabetes mellitus without complication (HCC)    Diabetic macular edema (HCC) 10/02/2016   Encounter for monitoring tacrolimus  therapy 06/28/2020   Glaucoma    Glaucoma 10/07/2017   Hyperlipidemia     Hypertension    Immunosuppressive management encounter following kidney transplant 10/18/2020   Left wrist pain 10/10/2022   Metabolic acidosis 06/14/2020   Migraines    Noncompliance    Nuclear sclerotic cataract of both eyes 10/02/2016   Numbness in left leg    Unspecified abnormal cytological findings in specimens from cervix uteri 10/09/2017   Past Surgical History:  Past Surgical History:  Procedure Laterality Date   KIDNEY TRANSPLANT  06/2020   WISDOM TOOTH EXTRACTION     Social History:  reports that she has never smoked. She has never used smokeless tobacco. She reports current alcohol use. She reports that she does not use drugs. Family History:  Family History  Problem Relation Age of Onset   Hypertension Mother    Stroke Mother    Hypertension Father    Cancer Maternal Grandfather        prostate   Breast cancer Cousin        maternal 1st cousin   Cancer Other    COPD Other    Hyperlipidemia Other      HOME MEDICATIONS: Allergies as of 03/24/2024       Reactions   Garlic Nausea And Vomiting        Medication List        Accurate as of March 24, 2024  1:52 PM. If you have any questions, ask your nurse or doctor.          amLODipine  5 MG tablet Commonly known as: NORVASC  Take 1.5 tablets (7.5 mg total) by mouth daily.   atorvastatin  20 MG tablet Commonly known as: LIPITOR Take 1 tablet (20 mg total) by mouth daily.   azaTHIOprine  50 MG tablet Commonly known as: IMURAN  Take 100 mg by mouth daily.   benzonatate  200 MG capsule Commonly known as: TESSALON  Take 1 capsule (200 mg total) by mouth 2 (two) times daily as needed for cough.   TRUE METRIX BLOOD GLUCOSE TEST VI   Contour Next Test test strip Generic drug: glucose blood Use as instructed to check blood sugar TID.   cyanocobalamin  1000 MCG tablet Take by mouth.   Dexcom G7 Sensor Misc Change sensore every 10 days.   dextromethorphan  30 MG/5ML liquid Commonly known as:  DELSYM  Take 5 mLs (30 mg total) by mouth 2 (two) times daily.   gabapentin  300 MG capsule Commonly known as: NEURONTIN  Take 2 capsules (600 mg total) by mouth 2 (two) times daily. Needs to be seen prior to next refill request.   guaiFENesin  600 MG 12 hr tablet Commonly known as: MUCINEX  Take 1 tablet (600 mg total) by mouth 2 (two) times daily for 14 days.   HYDROcodone  bit-homatropine 5-1.5 MG/5ML syrup  Commonly known as: HYCODAN Take 5 mLs by mouth every 4 (four) hours as needed for cough.   metoprolol  tartrate 25 MG tablet Commonly known as: LOPRESSOR  Take 0.5 tablets (12.5 mg total) by mouth 2 (two) times daily.   nortriptyline  25 MG capsule Commonly known as: PAMELOR  Take 1 capsule (25 mg total) by mouth at bedtime.   NovoLOG  100 UNIT/ML injection Generic drug: insulin  aspart Inject 80 units max daily dose. Use with pump   Omnipod 5 DexG7G6 Pods Gen 5 Misc Use as directed and change pod every 3 days   pantoprazole  40 MG tablet Commonly known as: PROTONIX  Take 1 tablet (40 mg total) by mouth 2 (two) times daily before a meal for 7 days, THEN 1 tablet (40 mg total) daily. Start taking on: March 13, 2024   predniSONE  5 MG tablet Commonly known as: DELTASONE  Take 5 mg by mouth daily with breakfast.   rizatriptan  10 MG disintegrating tablet Commonly known as: MAXALT -MLT Take 1 tablet (10 mg total) by mouth as needed for migraine. May repeat in 2 hours if needed.  Maximum 2 tablets in 24 hours.   Simbrinza  1-0.2 % Susp Generic drug: Brinzolamide -Brimonidine  Instill 1 drop into both eyes twice a day   tacrolimus  1 MG capsule Commonly known as: PROGRAF  Take 1 mg by mouth 2 (two) times daily.   Vitamin D  (Ergocalciferol ) 1.25 MG (50000 UNIT) Caps capsule Commonly known as: DRISDOL  Take 1 capsule (50,000 Units total) by mouth once a week.         OBJECTIVE:   Vital Signs: BP 126/84 (BP Location: Left Arm, Patient Position: Sitting, Cuff Size: Normal)   Pulse  85   Ht 5' 1 (1.549 m)   Wt 193 lb 9.6 oz (87.8 kg)   LMP 03/13/2024 (Exact Date)   SpO2 98%   BMI 36.58 kg/m   Wt Readings from Last 3 Encounters:  03/24/24 193 lb 9.6 oz (87.8 kg)  03/16/24 198 lb 4.8 oz (89.9 kg)  03/10/24 197 lb 12 oz (89.7 kg)     Exam: General: Pt appears well and is in NAD  Lungs: Clear with good BS bilat  Heart: RRR   Extremities: trace pretibial edema.   Neuro: MS is good with appropriate affect, pt is alert and Ox3   DM foot exam: 01/08/2024 per podiatry  The skin of the feet is intact without sores or ulcerations. The pedal pulses are 2+ on right and 2+ on left. The sensation is intact to a screening 5.07, 10 gram monofilament bilaterally           DATA REVIEWED:  Lab Results  Component Value Date   HGBA1C 8.5 (A) 11/25/2023   HGBA1C 8.2 (A) 06/28/2023   HGBA1C 11.2 (A) 03/13/2023   Labs on 11/18/2023 through [facility] Gluc 261 BUN 29 Cr 1.61 GFR 41   ASSESSMENT / PLAN / RECOMMENDATIONS:   1) Type 1 Diabetes Mellitus, Poorly controlled, With neuropathic, retinopathic , CKD III complications and S/P renal transplant   - Most recent A1c of 8.2 %. Goal A1c < 7.0 %.    -A1c continues to be above goal -Patient has been noted with postprandial hyperglycemia, I will adjust her insulin  to carb ratio as below -I will also increase her basal rate starting at 2 PM as below  MEDICATIONS:    Pump   Omnipod Settings   Insulin  type   Novolog    Basal rate       0000 1.20 u/h  1400 1.35          I:C ratio       0000 1:6   1600 1:6              Sensitivity       0000  30      Goal       0000  120           EDUCATION / INSTRUCTIONS: BG monitoring instructions: Patient is instructed to check her blood sugars 3 times a day, before meals  Call [facility] if: BG persistently < 70  I reviewed the Rule of 15 for the treatment of hypoglycemia in detail with the patient. Literature supplied.    2)  Diabetic complications:  Eye: Does  have known diabetic retinopathy.  Neuro/ Feet: Does have known diabetic peripheral neuropathy .  Renal: Patient does  have known baseline CKD, she is status post renal transplant. She   is not on an ACEI/ARB at present.    3) Dyslipidemia:   - Historically, LDL has been at goal -No changes   Medication Continue atorvastatin  20 mg daily   F/U in 4 months  I spent 25 minutes preparing to see the patient by review of recent labs, imaging and procedures, obtaining and reviewing separately obtained history, communicating with the patient, ordering medications, tests or procedures, and documenting clinical information in the EHR including the differential Dx, treatment, and any further evaluation and other management   Signed electronically by: Stefano Redgie Butts, MD  [facility] Endocrinology  [facility] [address] [place] [place], [place] [address] Phone: [phone] FAX: [phone]   CC: Vicci Barnie NOVAK, MD [address] [place] [address] [place] [address] Phone: [phone]  Fax: [phone]  Return to Endocrinology clinic as below: Future Appointments  Date Time Provider Department Center  03/24/2024  2:00 PM Avelino Herren, Donell Redgie, MD [facility] None  04/29/2024  1:30 PM Skeet Juliene SAUNDERS, DO [facility] None  04/30/2024 10:10 AM Vicci Barnie NOVAK, MD [facility] None  05/06/2024 10:35 AM Jeralyn Crutch, MD [facility]

## 2024-03-25 ENCOUNTER — Other Ambulatory Visit: Payer: Self-pay | Admitting: Internal Medicine

## 2024-03-27 ENCOUNTER — Other Ambulatory Visit: Payer: Self-pay

## 2024-04-11 ENCOUNTER — Other Ambulatory Visit: Payer: Self-pay

## 2024-04-20 ENCOUNTER — Other Ambulatory Visit: Payer: Self-pay

## 2024-04-24 ENCOUNTER — Other Ambulatory Visit: Payer: Self-pay

## 2024-04-28 NOTE — Progress Notes (Unsigned)
 NEUROLOGY FOLLOW UP OFFICE NOTE  [name] [id number]  Assessment/Plan:   Migraine without aura, without status migrainosus, intractable - improved but still has occasional intractable migraine that does not respond to rizatriptan  Hypertension   Migraine prevention:  Nortriptyline  to 25mg  at bedtime.  *** Migraine rescue:  rizatriptan  10mg .  Will provide samples of Nurtec to take as second line if needed; Zofran  for nausea *** Limit use of pain relievers to no more than 2 days out of week to prevent risk of rebound or medication-overuse headache. Keep headache diary Follow up with PCP regarding blood pressure Follow up 6 months.       Subjective:  Cathy Green is a 43 year old female with HTN, HLD, DM I, CKD s/p kidney transplant, and glaucoma who follows up for migraines.  UPDATE: Increased nortriptyline  Migraines improved. Intensity:  5-8/10 (sometimes 10) Duration:  within 2 hours with rizatriptan  (sometimes repeats dose).  Nurtec samples *** Frequency:  3 days a month Frequency of abortive medication: none Current NSAIDS/analgesics:  none Current triptans:  rizatriptan  10mg   Current ergotamine:  none Current anti-emetic:  ondansetron  4mg  Current muscle relaxants:  none Current Antihypertensive medications:  amlodipine  5mg  daily Current Antidepressant medications:  gabapentin  300mg  BID (neuralgia) Current Anticonvulsant medications:  nortriptyline  25mg  at bedtime  Current anti-CGRP:  none Current Vitamins/Herbal/Supplements:  none Current Antihistamines/Decongestants:  Flonase  Other therapy:  none Birth control:  no Other medication:  prednisone  5mg  daily    Caffeine:  1 cup coffee 3 times a week.   Diet:  at least 64 oz water.  Decaff soda.  Skips meals Exercise:  no Depression:  no; Anxiety:  no Other pain:  no Sleep hygiene:  good.  At least 6 hours sleep a night.  HISTORY:  Onset:  43 years old.  Worse over past 2 years. Location:  may start over  right eye but progresses to holocephalic Quality:  pressure, throbbing, stabbing (right eye) Intensity:  10/10.   Aura:  absent Prodrome:  absent Associated symptoms:  Blurred vision, nausea, photophobia, phonophobia.  She denies associated   vomiting, osmophobia, unilateral numbness or weakness. Duration:  2 days on average Frequency:  1 to 2 times a week (total 5-6 days a week) Frequency of abortive medication: 0 Triggers:  menstrual cycle, weather Relieving factors:  cold mask, dark room, rest Activity:  aggravate  Eye exam reportedly unremarkable.   Past NSAIDS/analgesics:  acetaminophen , ASA, ibuprofen  - CANNOT TAKE NSAIDS DUE TO KIDNEY DISEASE Past abortive triptans:  unsure Past abortive ergotamine:  none Past muscle relaxants:  methocarbamol  Past anti-emetic:  promethazine  Past antihypertensive medications:  carvedilol , lisinopril  Past antidepressant medications:  none Past anticonvulsant medications:  none Past anti-CGRP:  none Past vitamins/Herbal/Supplements:  none Past antihistamines/decongestants:  none Other past therapies:  none    Family history of headache:  no  PAST MEDICAL HISTORY: 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   Diabetes mellitus    diagnosed age 35   Diabetes mellitus without complication (HCC)    Diabetic macular edema (HCC) 10/02/2016   Encounter for monitoring tacrolimus  therapy 06/28/2020   Glaucoma    Glaucoma 10/07/2017  Hyperlipidemia    Hypertension    Immunosuppressive management encounter following kidney transplant 10/18/2020   Left wrist pain 10/10/2022   Metabolic acidosis  06/14/2020   Migraines    Noncompliance    Nuclear sclerotic cataract of both eyes 10/02/2016   Numbness in left leg    Unspecified abnormal cytological findings in specimens from cervix uteri 10/09/2017    MEDICATIONS: Current Outpatient Medications on File Prior to Visit  Medication Sig Dispense Refill   amLODipine  (NORVASC ) 5 MG tablet Take 1.5 tablets (7.5 mg total) by mouth daily. 135 tablet 1   atorvastatin  (LIPITOR) 20 MG tablet Take 1 tablet (20 mg total) by mouth daily. 90 tablet 3   azaTHIOprine  (IMURAN ) 50 MG tablet Take 100 mg by mouth daily.     benzonatate  (TESSALON ) 200 MG capsule Take 1 capsule (200 mg total) by mouth 2 (two) times daily as needed for cough. (Patient not taking: Reported on 03/24/2024) 30 capsule 0   Brinzolamide -Brimonidine  (SIMBRINZA ) 1-0.2 % SUSP Instill 1 drop into both eyes twice a day 8 mL 11   Continuous Glucose Sensor (DEXCOM G7 SENSOR) MISC Change sensore every 10 days. 9 each 3   cyanocobalamin  1000 MCG tablet Take by mouth.     dextromethorphan  (DELSYM ) 30 MG/5ML liquid Take 5 mLs (30 mg total) by mouth 2 (two) times daily. (Patient not taking: Reported on 03/24/2024) 89 mL 0   gabapentin  (NEURONTIN ) 300 MG capsule Take 2 capsules (600 mg total) by mouth 2 (two) times daily. Needs to be seen prior to next refill request. 360 capsule 3   glucose blood (CONTOUR NEXT TEST) test strip Use as instructed to check blood sugar TID. 100 each 6   Glucose Blood (TRUE METRIX BLOOD GLUCOSE TEST VI)      HYDROcodone  bit-homatropine (HYCODAN) 5-1.5 MG/5ML syrup Take 5 mLs by mouth every 4 (four) hours as needed for cough. 120 mL 0   insulin  aspart (NOVOLOG ) 100 UNIT/ML injection Inject 80 units max daily dose. Use with pump 30 mL 11   Insulin  Disposable Pump (OMNIPOD 5 DEXG7G6 PODS GEN 5) MISC Use as directed and change pod every 3 days 30 each 3   metoprolol  tartrate (LOPRESSOR ) 25 MG tablet Take 0.5 tablets (12.5 mg total) by mouth 2 (two) times daily. 30 tablet  0   nortriptyline  (PAMELOR ) 25 MG capsule Take 1 capsule (25 mg total) by mouth at bedtime. 30 capsule 5   pantoprazole  (PROTONIX ) 40 MG tablet Take 1 tablet (40 mg total) by mouth daily. 30 tablet 2   predniSONE  (DELTASONE ) 5 MG tablet Take 5 mg by mouth daily with breakfast.     rizatriptan  (MAXALT -MLT) 10 MG disintegrating tablet Take 1 tablet (10 mg total) by mouth as needed for migraine. May repeat in 2 hours if needed.  Maximum 2 tablets in 24 hours. 9 tablet 5   tacrolimus  (PROGRAF ) 1 MG capsule Take 1 mg by mouth 2 (two) times daily.     Vitamin D , Ergocalciferol , (DRISDOL ) 1.25 MG (50000 UNIT) CAPS capsule Take 1 capsule (50,000 Units total) by mouth once a week. 6 capsule 0   No current facility-administered medications on file prior to visit.    ALLERGIES: Allergies  Allergen Reactions   Garlic Nausea And Vomiting    FAMILY HISTORY: Family History  Problem Relation Age of Onset   Hypertension Mother    Stroke Mother    Hypertension Father    Cancer Maternal Grandfather        prostate  Breast cancer Cousin        maternal 1st cousin   Cancer Other    COPD Other    Hyperlipidemia Other       Objective:  *** General: No acute distress.  Patient appears well-groomed.   ***    Juliene Dunnings, DO  CC: Barnie Louder, MD

## 2024-04-29 ENCOUNTER — Ambulatory Visit (INDEPENDENT_AMBULATORY_CARE_PROVIDER_SITE_OTHER): Payer: Medicaid Other | Admitting: Neurology

## 2024-04-29 ENCOUNTER — Encounter: Payer: Self-pay | Admitting: Neurology

## 2024-04-29 ENCOUNTER — Other Ambulatory Visit: Payer: Self-pay

## 2024-04-29 ENCOUNTER — Telehealth: Payer: Self-pay | Admitting: Internal Medicine

## 2024-04-29 VITALS — BP 114/73 | HR 101 | Ht 61.0 in | Wt 196.2 lb

## 2024-04-29 DIAGNOSIS — H25813 Combined forms of age-related cataract, bilateral: Secondary | ICD-10-CM | POA: Diagnosis not present

## 2024-04-29 DIAGNOSIS — E103393 Type 1 diabetes mellitus with moderate nonproliferative diabetic retinopathy without macular edema, bilateral: Secondary | ICD-10-CM | POA: Diagnosis not present

## 2024-04-29 DIAGNOSIS — H40013 Open angle with borderline findings, low risk, bilateral: Secondary | ICD-10-CM | POA: Diagnosis not present

## 2024-04-29 DIAGNOSIS — G43009 Migraine without aura, not intractable, without status migrainosus: Secondary | ICD-10-CM | POA: Diagnosis not present

## 2024-04-29 MED ORDER — NORTRIPTYLINE HCL 25 MG PO CAPS
25.0000 mg | ORAL_CAPSULE | Freq: Every day | ORAL | 11 refills | Status: AC
  Filled 2024-04-29 – 2024-05-22 (×2): qty 30, 30d supply, fill #0
  Filled 2024-06-21: qty 30, 30d supply, fill #1
  Filled 2024-07-28: qty 30, 30d supply, fill #2
  Filled 2024-08-23: qty 30, 30d supply, fill #3
  Filled 2024-09-25 – 2024-10-14 (×2): qty 30, 30d supply, fill #4

## 2024-04-29 MED ORDER — RIZATRIPTAN BENZOATE 10 MG PO TBDP
10.0000 mg | ORAL_TABLET | ORAL | 11 refills | Status: AC | PRN
  Filled 2024-04-29 – 2024-07-28 (×2): qty 9, 30d supply, fill #0
  Filled 2024-10-14: qty 9, 30d supply, fill #1

## 2024-04-29 NOTE — Telephone Encounter (Signed)
 Confirmed appt 7/31

## 2024-04-30 ENCOUNTER — Other Ambulatory Visit: Payer: Self-pay

## 2024-04-30 ENCOUNTER — Ambulatory Visit: Attending: Internal Medicine | Admitting: Internal Medicine

## 2024-04-30 ENCOUNTER — Encounter: Payer: Self-pay | Admitting: Internal Medicine

## 2024-04-30 VITALS — BP 130/85 | HR 93 | Temp 97.8°F | Ht 61.0 in | Wt 196.0 lb

## 2024-04-30 DIAGNOSIS — N1831 Chronic kidney disease, stage 3a: Secondary | ICD-10-CM

## 2024-04-30 DIAGNOSIS — I129 Hypertensive chronic kidney disease with stage 1 through stage 4 chronic kidney disease, or unspecified chronic kidney disease: Secondary | ICD-10-CM

## 2024-04-30 DIAGNOSIS — E1059 Type 1 diabetes mellitus with other circulatory complications: Secondary | ICD-10-CM

## 2024-04-30 DIAGNOSIS — Z09 Encounter for follow-up examination after completed treatment for conditions other than malignant neoplasm: Secondary | ICD-10-CM | POA: Diagnosis not present

## 2024-04-30 DIAGNOSIS — D631 Anemia in chronic kidney disease: Secondary | ICD-10-CM | POA: Diagnosis not present

## 2024-04-30 DIAGNOSIS — E103413 Type 1 diabetes mellitus with severe nonproliferative diabetic retinopathy with macular edema, bilateral: Secondary | ICD-10-CM | POA: Diagnosis not present

## 2024-04-30 MED ORDER — LOSARTAN POTASSIUM 25 MG PO TABS
25.0000 mg | ORAL_TABLET | Freq: Every day | ORAL | 1 refills | Status: AC
Start: 2024-04-30 — End: ?
  Filled 2024-04-30: qty 90, 90d supply, fill #0
  Filled 2024-08-18: qty 90, 90d supply, fill #1

## 2024-04-30 MED ORDER — AMLODIPINE BESYLATE 5 MG PO TABS
7.5000 mg | ORAL_TABLET | Freq: Every day | ORAL | 1 refills | Status: AC
  Filled 2024-04-30 – 2024-06-21 (×2): qty 135, 90d supply, fill #0
  Filled 2024-09-17: qty 135, 90d supply, fill #1

## 2024-04-30 NOTE — Patient Instructions (Signed)
Restart Losartan 25 mg daily.

## 2024-04-30 NOTE — Progress Notes (Signed)
 Patient ID: Cathy Green, female    DOB: 12/02/1980  MRN: [id number]  CC: Hospitalization Follow-up (Hospitalization f/u. /No questions / concerns/)   Subjective: Cathy Green is a 43 y.o. female who presents for chronic ds management. 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   Discussed the use of AI scribe software for clinical note transcription with the patient, who gave verbal consent to proceed.  History of Present Illness Cathy Green is a 43 year old female with diabetes and hypertension who presents for follow-up after hospitalization for pneumonia.  She was hospitalized in June for a persistent cough lasting three weeks, treated as possible pneumonia. Her symptoms have improved significantly, though she still experiences a lingering cough that is less severe. She completed the prescribed antibiotics during her hospitalization.  She has a history of diabetes managed with an insulin  pump using Novolog  insulin . Her last A1c was 8.2, measured at the end of June when she saw her endocrinologist Dr. Kellie. She reports stable blood sugars.  Hypertension: she was previously on amlodipine  7.5 mg daily and losartan  25 mg daily.  The Cozaar  was started after her last visit with me due to microalbumin urea with about 1 g of protein in the urine.  During her hospitalization, losartan  was discontinued and replaced with metoprolol  25 mg twice daily, which she completed without a refill and no longer taking. She monitors her blood pressure at home, with readings ranging from 114/70 to 150/84 mmHg.  She has a history of kidney transplant.  Her recent GFRs range between 40 to 50. She is under the care of a nephrologist in [place], whom she last saw at the end of May. She is on immunosuppressive therapy including Imuran  100 mg daily,  Prograf  1 mg twice daily, and prednisone  5 mg daily.  ACD: Her hemoglobin levels have been stable, with recent measurements around 11.5, consistent with previous levels in May.  HM: She recently had her diabetic eye exam at [facility].    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) 11/14/2020   Insulin  dependent type 1 diabetes mellitus (HCC) 11/14/2020   Hyperlipidemia due to type 1 diabetes mellitus (HCC) 08/02/2020   Encounter for aftercare following kidney transplant 06/28/2020   ESRD (end stage renal disease) (HCC) 06/23/2020   Hypoglycemia due to insulin  02/12/2020   CKD stage 5 due to type 1 diabetes mellitus (HCC) 09/13/2019   Migraine without aura and without status migrainosus, not intractable 06/22/2019   Chest pain 11/04/2018   Hyperkalemia 11/04/2018   Diabetic retinopathy (HCC) 04/26/2017   Proteinuria 11/01/2015   Normocytic anemia 09/07/2011   Hyperlipidemia 09/07/2011   Hypertension 09/07/2011   Anemia of renal disease 09/07/2011   Controlled type 1 diabetes with renal manifestation (HCC) 09/07/2011     Current Outpatient Medications on File Prior to Visit  Medication Sig Dispense Refill   atorvastatin  (LIPITOR)  20 MG tablet Take 1 tablet (20 mg total) by mouth daily. 90 tablet 3   azaTHIOprine  (IMURAN ) 50 MG tablet Take 100 mg by mouth daily.     benzonatate  (TESSALON ) 200 MG capsule Take 1 capsule (200 mg total) by mouth 2 (two) times daily as needed for cough. 30 capsule 0    Brinzolamide -Brimonidine  (SIMBRINZA ) 1-0.2 % SUSP Instill 1 drop into both eyes twice a day 8 mL 11   Continuous Glucose Sensor (DEXCOM G7 SENSOR) MISC Change sensore every 10 days. 9 each 3   cyanocobalamin  1000 MCG tablet Take by mouth.     dextromethorphan  (DELSYM ) 30 MG/5ML liquid Take 5 mLs (30 mg total) by mouth 2 (two) times daily. (Patient taking differently: Take 30 mg by mouth as needed.) 89 mL 0   gabapentin  (NEURONTIN ) 300 MG capsule Take 2 capsules (600 mg total) by mouth 2 (two) times daily. Needs to be seen prior to next refill request. 360 capsule 3   glucose blood (CONTOUR NEXT TEST) test strip Use as instructed to check blood sugar TID. 100 each 6   Glucose Blood (TRUE METRIX BLOOD GLUCOSE TEST VI)      insulin  aspart (NOVOLOG ) 100 UNIT/ML injection Inject 80 units max daily dose. Use with pump 30 mL 11   Insulin  Disposable Pump (OMNIPOD 5 DEXG7G6 PODS GEN 5) MISC Use as directed and change pod every 3 days 30 each 3   nortriptyline  (PAMELOR ) 25 MG capsule Take 1 capsule (25 mg total) by mouth at bedtime. 30 capsule 11   pantoprazole  (PROTONIX ) 40 MG tablet Take 1 tablet (40 mg total) by mouth daily. 30 tablet 2   predniSONE  (DELTASONE ) 5 MG tablet Take 5 mg by mouth daily with breakfast.     rizatriptan  (MAXALT -MLT) 10 MG disintegrating tablet Take 1 tablet (10 mg total) by mouth as needed for migraine. May repeat in 2 hours if needed.  Maximum 2 tablets in 24 hours. 9 tablet 11   tacrolimus  (PROGRAF ) 1 MG capsule Take 1 mg by mouth 2 (two) times daily.     No current facility-administered medications on file prior to visit.    Allergies  Allergen Reactions   Garlic Nausea And Vomiting    Social History   Socioeconomic History   Marital status: Significant Other    Spouse name: Not on file   Number of children: Not on file   Years of education: Not on file   Highest education level: Bachelor's degree (e.g., BA, AB, BS)  Occupational History   Not on file   Tobacco Use   Smoking status: Never   Smokeless tobacco: Never  Vaping Use   Vaping status: Never Used  Substance and Sexual Activity   Alcohol use: Yes    Comment: occasional    Drug use: No   Sexual activity: Never    Birth control/protection: Pill  Other Topics Concern   Not on file  Social History Narrative   ** Merged History Encounter **       Social Drivers of Health   Financial Resource Strain: Low Risk  (12/06/2023)   Overall Financial Resource Strain (CARDIA)    Difficulty of Paying Living Expenses: Not hard at all  Food Insecurity: No Food Insecurity (03/11/2024)   Hunger Vital Sign    Worried About Running Out of Food in the Last Year: Never true    Ran Out of Food in the Last Year: Never true  Transportation Needs: No Transportation Needs (03/11/2024)   PRAPARE -  Administrator, Civil Service (Medical): No    Lack of Transportation (Non-Medical): No  Physical Activity: Sufficiently Active (12/06/2023)   Exercise Vital Sign    Days of Exercise per Week: 5 days    Minutes of Exercise per Session: 40 min  Stress: No Stress Concern Present (12/06/2023)   [employer] of Occupational Health - Occupational Stress Questionnaire    Feeling of Stress : Not at all  Social Connections: Socially Isolated (12/06/2023)   Social Connection and Isolation Panel    Frequency of Communication with Friends and Family: More than three times a week    Frequency of Social Gatherings with Friends and Family: Twice a week    Attends Religious Services: Never    Database administrator or Organizations: No    Attends Banker Meetings: Never    Marital Status: Never married  Intimate Partner Violence: Not At Risk (03/11/2024)   Humiliation, Afraid, Rape, and Kick questionnaire    Fear of Current or Ex-Partner: No    Emotionally Abused: No    Physically Abused: No    Sexually Abused: No    Family History  Problem Relation Age of Onset   Hypertension Mother     Stroke Mother    Hypertension Father    Cancer Maternal Grandfather        prostate   Breast cancer Cousin        maternal 1st cousin   Cancer Other    COPD Other    Hyperlipidemia Other     Past Surgical History:  Procedure Laterality Date   KIDNEY TRANSPLANT  06/2020   WISDOM TOOTH EXTRACTION      ROS: Review of Systems Negative except as stated above  PHYSICAL EXAM: BP 130/85   Pulse 93   Temp 97.8 F (36.6 C) (Oral)   Ht 5' 1 (1.549 m)   Wt 196 lb (88.9 kg)   SpO2 98%   BMI 37.03 kg/m   Physical Exam  General appearance - alert, well appearing, and in no distress Mental status - normal mood, behavior, speech, dress, motor activity, and thought processes Neck - supple, no significant adenopathy Chest - clear to auscultation, no wheezes, rales or rhonchi, symmetric air entry Heart - normal rate, regular rhythm, normal S1, S2, no murmurs, rubs, clicks or gallops Ext: no edema      Latest Ref Rng & Units 03/13/2024    5:34 AM 03/12/2024    7:02 AM 03/11/2024    5:33 AM  CMP  Glucose 70 - 99 mg/dL 840  831  764   BUN 6 - 20 mg/dL 22  24  21    Creatinine 0.44 - 1.00 mg/dL 8.59  8.46  8.62   Sodium 135 - 145 mmol/L 137  136  134   Potassium 3.5 - 5.1 mmol/L 4.4  4.3  5.2   Chloride 98 - 111 mmol/L 110  108  110   CO2 22 - 32 mmol/L 20  20  19    Calcium  8.9 - 10.3 mg/dL 8.6  8.7  9.2   Total Protein 6.5 - 8.1 g/dL   7.2   Total Bilirubin 0.0 - 1.2 mg/dL   0.6   Alkaline Phos 38 - 126 U/L   77   AST 15 - 41 U/L   17   ALT 0 - 44 U/L   45    Lipid Panel     Component Value Date/Time   CHOL 158 03/13/2023  1541   CHOL 227 (H) 05/27/2018 1602   TRIG 197.0 (H) 03/13/2023 1541   HDL 53.70 03/13/2023 1541   HDL 59 05/27/2018 1602   CHOLHDL 3 03/13/2023 1541   VLDL 39.4 03/13/2023 1541   LDLCALC 64 03/13/2023 1541   LDLCALC 140 (H) 05/27/2018 1602   LDLDIRECT 121.0 06/21/2021 1459    CBC    Component Value Date/Time   WBC 9.3 03/12/2024 0702   RBC  4.37 03/12/2024 0702   HGB 11.5 (L) 03/12/2024 0702   HGB 8.9 (L) 11/12/2018 1030   HGB 11.6 09/07/2011 1500   HCT 37.2 03/12/2024 0702   HCT 28.3 (L) 11/12/2018 1030   HCT 34.7 (L) 09/07/2011 1500   PLT 277 03/12/2024 0702   PLT 217 11/12/2018 1030   MCV 85.1 03/12/2024 0702   MCV 84 11/12/2018 1030   MCV 79.4 (L) 09/07/2011 1500   MCH 26.3 03/12/2024 0702   MCHC 30.9 03/12/2024 0702   RDW 17.2 (H) 03/12/2024 0702   RDW 12.9 11/12/2018 1030   RDW 13.8 09/07/2011 1500   LYMPHSABS 1.3 03/10/2024 1538   LYMPHSABS 1.6 11/12/2018 1030   LYMPHSABS 1.7 09/07/2011 1500   MONOABS 0.5 03/10/2024 1538   MONOABS 0.2 09/07/2011 1500   EOSABS 0.0 03/10/2024 1538   EOSABS 0.1 11/12/2018 1030   BASOSABS 0.0 03/10/2024 1538   BASOSABS 0.0 11/12/2018 1030   BASOSABS 0.0 09/07/2011 1500    ASSESSMENT AND PLAN: 1. Hospital discharge follow-up (Primary) Recovered with slight lingering cough but much improved.  2. Hypertension associated with type 1 diabetes mellitus (HCC) Not at goal.  Continue amlodipine  7.5 mg daily.  Restart Cozaar  25 mg daily.  I had previously gotten the okay from her nephrologist to have her on the Cozaar  due to the macroalbumin - amLODipine  (NORVASC ) 5 MG tablet; Take 1.5 tablets (7.5 mg total) by mouth daily.  Dispense: 135 tablet; Refill: 1 - losartan  (COZAAR ) 25 MG tablet; Take 1 tablet (25 mg total) by mouth daily.  Dispense: 90 tablet; Refill: 1  3. 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  She is on insulin  pump.  We will request note from Dr. Octavia of her recent eye exam  4. Stage 3a chronic kidney disease (HCC) GFR staying in the 40s to 50s.  Restart Cozaar .  5. Anemia due to stage 3a chronic kidney disease (HCC) Stable.  Patient was given the opportunity to ask questions.  Patient verbalized understanding of the plan and was able to repeat key elements of the plan.    This documentation was completed using Paediatric nurse.  Any transcriptional errors are unintentional.  No orders of the defined types were placed in this encounter.    Requested Prescriptions   Signed Prescriptions Disp Refills   amLODipine  (NORVASC ) 5 MG tablet 135 tablet 1    Sig: Take 1.5 tablets (7.5 mg total) by mouth daily.   losartan  (COZAAR ) 25 MG tablet 90 tablet 1    Sig: Take 1 tablet (25 mg total) by mouth daily.    Return in about 4 months (around 08/30/2024).  Barnie Louder, MD, FACP

## 2024-05-06 ENCOUNTER — Other Ambulatory Visit: Payer: Self-pay

## 2024-05-06 ENCOUNTER — Ambulatory Visit: Admitting: Obstetrics and Gynecology

## 2024-05-06 ENCOUNTER — Encounter: Payer: Self-pay | Admitting: Obstetrics and Gynecology

## 2024-05-06 VITALS — BP 143/95 | HR 91 | Wt 199.2 lb

## 2024-05-06 DIAGNOSIS — R8789 Other abnormal findings in specimens from female genital organs: Secondary | ICD-10-CM

## 2024-05-06 DIAGNOSIS — Z1331 Encounter for screening for depression: Secondary | ICD-10-CM | POA: Diagnosis not present

## 2024-05-06 DIAGNOSIS — R87622 Low grade squamous intraepithelial lesion on cytologic smear of vagina (LGSIL): Secondary | ICD-10-CM | POA: Diagnosis not present

## 2024-05-06 NOTE — Progress Notes (Unsigned)
 GYNECOLOGY VISIT  Patient name: Cathy Green MRN [id number]  Date of birth: Jan 25, 1981 Chief Complaint:   Results  History:  Cathy Green is a 43 y.o. No obstetric history on file. being seen today for discussion of results/colposcopy.     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   Diabetes mellitus    diagnosed age 89   Diabetes mellitus without complication (HCC)    Diabetic macular edema (HCC) 10/02/2016   Encounter for monitoring tacrolimus  therapy 06/28/2020   Glaucoma    Glaucoma 10/07/2017   Hyperlipidemia    Hypertension    Immunosuppressive management encounter following kidney transplant 10/18/2020   Left wrist pain 10/10/2022   Metabolic acidosis 06/14/2020   Migraines    Noncompliance    Nuclear sclerotic cataract of both eyes 10/02/2016   Numbness in left leg    Unspecified abnormal cytological findings in specimens from cervix uteri 10/09/2017    Past Surgical History:  Procedure Laterality Date   KIDNEY TRANSPLANT  06/2020   WISDOM TOOTH EXTRACTION      The following portions of the patient's history were reviewed and updated as appropriate: allergies, current medications, past family history, past medical history, past social history, past surgical history and problem list.   Health Maintenance:   Last pap     Component Value Date/Time   DIAGPAP - Low grade squamous intraepithelial lesion (LSIL) (A) 01/16/2024 1501   DIAGPAP  08/02/2022 1642    - Negative for intraepithelial lesion or malignancy (NILM)   HPVHIGH Positive (A) 01/16/2024 1501   HPVHIGH Positive (A) 08/02/2022 1642   ADEQPAP   01/16/2024 1501    Satisfactory for evaluation; transformation zone component PRESENT.   ADEQPAP  08/02/2022 1642    Satisfactory for evaluation; transformation zone component PRESENT.   COLPOSCOPY  03/16/2024   FINAL MICROSCOPIC DIAGNOSIS:   A. CERVIX, 1 O'CLOCK, BIOPSY:  - Extremely rare detached squamous epithelial cells with dysplasia  consistent with low-grade squamous intraepithelial lesion (LSIL/CIN-1);  HSIL cannot be entirely excluded.   B. ENDOCERVIX, CURETTAGE:  - Tiny fragment of tangentially sectioned squamous epithelium with at  least low-grade squamous intraepithelial lesion (LSIL/CIN-1); HSIL  cannot be entirely excluded.  - Unremarkable endocervical glandular epithelium.   Comment:  Additional tissue sampling is recommended.   Review of Systems:  Pertinent items are noted in HPI. Comprehensive review of systems was otherwise negative.   Objective:  Physical Exam BP (!) 141/93   Pulse 90   Wt 199 lb 3.2 oz (90.4 kg)   LMP 04/14/2024 (Exact Date)   BMI 37.64 kg/m    Physical Exam Vitals and nursing note reviewed.  Constitutional:      Appearance: Normal appearance.  HENT:     Head: Normocephalic and atraumatic.  Pulmonary:     Effort: Pulmonary effort is normal.  Skin:    General: Skin is warm and dry.  Neurological:     General: No focal deficit present.     Mental Status: She is alert.  Psychiatric:        Mood and Affect: Mood normal.        Behavior: Behavior  normal.        Thought Content: Thought content normal.        Judgment: Judgment normal.         Assessment & Plan:   1. 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 After discussion patient opts for repeat colposcopy at this time and if found to have pathology that confirms  high grade changes, will proceed with treatment at that time.   Routine preventative health maintenance measures emphasized.  Carter Quarry, MD [facility]ally Invasive [facility]ologic [facility] for [facility], [facility]

## 2024-05-06 NOTE — Patient Instructions (Addendum)
  [email or web address]   [email or web address]

## 2024-05-07 ENCOUNTER — Other Ambulatory Visit: Payer: Self-pay

## 2024-05-18 ENCOUNTER — Ambulatory Visit: Admitting: Internal Medicine

## 2024-05-22 ENCOUNTER — Other Ambulatory Visit: Payer: Self-pay

## 2024-05-26 ENCOUNTER — Other Ambulatory Visit: Payer: Self-pay

## 2024-06-15 DIAGNOSIS — D849 Immunodeficiency, unspecified: Secondary | ICD-10-CM | POA: Diagnosis not present

## 2024-06-15 DIAGNOSIS — Z4822 Encounter for aftercare following kidney transplant: Secondary | ICD-10-CM | POA: Diagnosis not present

## 2024-06-15 DIAGNOSIS — Z8619 Personal history of other infectious and parasitic diseases: Secondary | ICD-10-CM | POA: Diagnosis not present

## 2024-06-15 DIAGNOSIS — M791 Myalgia, unspecified site: Secondary | ICD-10-CM | POA: Diagnosis not present

## 2024-06-15 DIAGNOSIS — E119 Type 2 diabetes mellitus without complications: Secondary | ICD-10-CM | POA: Diagnosis not present

## 2024-06-15 DIAGNOSIS — I10 Essential (primary) hypertension: Secondary | ICD-10-CM | POA: Diagnosis not present

## 2024-06-19 ENCOUNTER — Other Ambulatory Visit: Payer: Self-pay | Admitting: Internal Medicine

## 2024-06-19 NOTE — Telephone Encounter (Signed)
 Requested medications are due for refill today.  unsure  Requested medications are on the active medications list.  yes  Last refill. 2022  Future visit scheduled.   yes  Notes to clinic.  Refusal not delegated.    Requested Prescriptions  Pending Prescriptions Disp Refills   predniSONE  (DELTASONE ) 5 MG tablet      Sig: Take 1 tablet (5 mg total) by mouth daily with breakfast.     Not Delegated - Endocrinology:  Oral Corticosteroids Failed - 06/19/2024  4:22 PM      Failed - This refill cannot be delegated      Failed - Manual Review: Eye exam for IOP if prolonged treatment      Failed - Glucose (serum) in normal range and within 180 days    Glucose, Bld  Date Value Ref Range Status  03/13/2024 159 (H) 70 - 99 mg/dL Final    Comment:    Glucose reference range applies only to samples taken after fasting for at least 8 hours.   POC Glucose  Date Value Ref Range Status  08/07/2023 154 (A) 70 - 99 mg/dl Final   Glucose Fasting, POC  Date Value Ref Range Status  06/22/2019 189 (A) 70 - 99 mg/dL Final   Glucose-Capillary  Date Value Ref Range Status  03/13/2024 114 (H) 70 - 99 mg/dL Final    Comment:    Glucose reference range applies only to samples taken after fasting for at least 8 hours.         Failed - Last BP in normal range    BP Readings from Last 1 Encounters:  05/06/24 (!) 143/95         Failed - Bone Mineral Density or Dexa Scan completed in the last 2 years      Passed - K in normal range and within 180 days    Potassium  Date Value Ref Range Status  03/13/2024 4.4 3.5 - 5.1 mmol/L Final         Passed - Na in normal range and within 180 days    Sodium  Date Value Ref Range Status  03/13/2024 137 135 - 145 mmol/L Final  01/26/2019 140 134 - 144 mmol/L Final         Passed - Valid encounter within last 6 months    Recent Outpatient Visits           1 month ago Hospital discharge follow-up   [facility] [place] [facility] [place]  [facility] [place] Barnie NOVAK, MD   5 months ago Pap smear for cervical cancer screening   [facility] [facility] [place] - A Dept Of [place]. [facility] [place] Barnie B, MD   6 months ago Type 1 diabetes mellitus with obesity [facility])   [place] [facility] [place] - [facility] Dept Of [place]. [facility] [place] Barnie B, MD   10 months ago Type 2 diabetes mellitus with hyperglycemia, with long-term current use of insulin  [facility])   [facility] [place] [facility] [place]. [facility] [place], Jon HERO, [place]   1 year ago Annual physical exam   [place] [facility] [place] - A Dept Of [place]. [facility] [place] Barnie NOVAK, MD       Future Appointments             In 2 months Vic[place] Barnie NOVAK, MD [facility]  [facility] [place] - A Dept Of [place]. [facility], [place]

## 2024-06-19 NOTE — Telephone Encounter (Signed)
 Copied from CRM [id number]. Topic: Clinical - Medication Refill >> Jun 19, 2024 10:37 AM Larissa S wrote: Medication: predniSONE  (DELTASONE ) 5 MG tablet  Has the patient contacted their pharmacy? Yes, pharmacy calling on behalf of pt (Agent: If no, request that the patient contact the pharmacy for the refill. If patient does not wish to contact the pharmacy document the reason why and proceed with request.) (Agent: If yes, when and what did the pharmacy advise?)  This is the patient's preferred pharmacy:    [facility] [address] [place], [facility] [address] [place] [address] Phone: [phone] Fax: [phone]  Is this the correct pharmacy for this prescription? Yes If no, delete pharmacy and type the correct one.   Has the prescription been filled recently? No  Is the patient out of the medication? Yes  Has the patient been seen for an appointment in the last year OR does the patient have an upcoming appointment? Yes  Can we respond through MyChart? No  Agent: Please be advised that Rx refills may take up to 3 business days. We ask that you follow-up with your pharmacy.

## 2024-06-22 ENCOUNTER — Other Ambulatory Visit: Payer: Self-pay

## 2024-06-23 ENCOUNTER — Other Ambulatory Visit (HOSPITAL_COMMUNITY)
Admission: RE | Admit: 2024-06-23 | Discharge: 2024-06-23 | Disposition: A | Discharge status: Home / Self Care | Source: Ambulatory Visit | Attending: Obstetrics and Gynecology | Admitting: Obstetrics and Gynecology

## 2024-06-23 ENCOUNTER — Ambulatory Visit: Admitting: Obstetrics and Gynecology

## 2024-06-23 ENCOUNTER — Other Ambulatory Visit: Payer: Self-pay

## 2024-06-23 VITALS — BP 129/85 | HR 102 | Wt 201.0 lb

## 2024-06-23 DIAGNOSIS — R87622 Low grade squamous intraepithelial lesion on cytologic smear of vagina (LGSIL): Secondary | ICD-10-CM

## 2024-06-23 DIAGNOSIS — R8789 Other abnormal findings in specimens from female genital organs: Secondary | ICD-10-CM

## 2024-06-23 DIAGNOSIS — Z3202 Encounter for pregnancy test, result negative: Secondary | ICD-10-CM | POA: Diagnosis not present

## 2024-06-23 DIAGNOSIS — N870 Mild cervical dysplasia: Secondary | ICD-10-CM | POA: Diagnosis not present

## 2024-06-23 LAB — POCT PREGNANCY, URINE: Preg Test, Ur: NEGATIVE

## 2024-06-23 NOTE — Progress Notes (Signed)
    GYNECOLOGY OFFICE COLPOSCOPY PROCEDURE NOTE  43 y.o. No obstetric history on file. here for repeat   Patient gave informed written consent, time out was performed.  Placed in lithotomy position. Cervix viewed with speculum and colposcope after application of acetic acid.   Colposcopy adequate? Yes Smooth appearing cervix without much of a true transition zone appreciated  acetowhite lesion(s) noted at 1 o'clock just outside of os; corresponding biopsies obtained.  ECC specimen obtained. All specimens were labeled and sent to pathology.  Chaperone was present during entire procedure.  Patient was given post procedure instructions.  Will follow up pathology and manage accordingly; patient will be contacted with results and recommendations.  Routine preventative health maintenance measures emphasized.   Carter Quarry, MD, FACOG Minimally Invasive Gynecologic Surgery  Obstetrics and Gynecology, [facility] for [facility], [facility] 06/23/2024

## 2024-06-23 NOTE — Addendum Note (Signed)
 Addended by: Di Jasmer O on: 06/23/2024 02:05 PM   Modules accepted: Orders

## 2024-06-24 ENCOUNTER — Other Ambulatory Visit: Payer: Self-pay

## 2024-06-25 LAB — SURGICAL PATHOLOGY

## 2024-06-29 ENCOUNTER — Ambulatory Visit (HOSPITAL_BASED_OUTPATIENT_CLINIC_OR_DEPARTMENT_OTHER): Payer: Self-pay | Admitting: Obstetrics & Gynecology

## 2024-07-01 NOTE — Telephone Encounter (Signed)
 Informed pt of colposcopy results and advise pt that provider recommends to repeat Pap smear and HPV testing in one year to monitor for any CIN1 cell change.  Pt verbalized understanding, she had no questions at this time.    Waddell, RN

## 2024-07-01 NOTE — Telephone Encounter (Addendum)
 RN attempted to call pt in regards to colposcopy results and provider recommendation.  Left voicemail with office call back number.    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  Needs repeat pap and HR HPV 1 year.    Dr. Pinal, covering for Dr. Jeralyn ----- Message ----- From: Rebecka, [facility] [place] Sent: 06/23/2024   1:57 PM EDT To: Carter Jeralyn, MD

## 2024-07-20 ENCOUNTER — Other Ambulatory Visit: Payer: Self-pay | Admitting: Internal Medicine

## 2024-07-23 ENCOUNTER — Other Ambulatory Visit (HOSPITAL_COMMUNITY): Payer: Self-pay

## 2024-07-24 ENCOUNTER — Ambulatory Visit (INDEPENDENT_AMBULATORY_CARE_PROVIDER_SITE_OTHER): Admitting: Internal Medicine

## 2024-07-24 ENCOUNTER — Encounter: Payer: Self-pay | Admitting: Internal Medicine

## 2024-07-24 VITALS — BP 140/84 | HR 104 | Ht 61.0 in | Wt 205.8 lb

## 2024-07-24 DIAGNOSIS — E1042 Type 1 diabetes mellitus with diabetic polyneuropathy: Secondary | ICD-10-CM

## 2024-07-24 DIAGNOSIS — E785 Hyperlipidemia, unspecified: Secondary | ICD-10-CM

## 2024-07-24 DIAGNOSIS — Z940 Kidney transplant status: Secondary | ICD-10-CM

## 2024-07-24 DIAGNOSIS — E1065 Type 1 diabetes mellitus with hyperglycemia: Secondary | ICD-10-CM | POA: Diagnosis not present

## 2024-07-24 LAB — POCT GLYCOSYLATED HEMOGLOBIN (HGB A1C): Hemoglobin A1C: 8.1 % — AB (ref 4.0–5.6)

## 2024-07-24 NOTE — Progress Notes (Signed)
 Name: Cathy Green  Age/ Sex: 43 y.o., female   MRN/ DOB: [id number], 08-May-1981     PCP: Vicci Barnie NOVAK, MD   Reason for Endocrinology Evaluation: Type 1 Diabetes Mellitus  Initial Endocrine Consultative Visit: 11/14/2020    PATIENT IDENTIFIER: Cathy Green is a 43 y.o. female with a past medical history of T1DM , HTN and Dyslipidemia and hx of renal transplant. The patient has followed with Endocrinology clinic since 11/14/2020 for consultative assistance with management of her diabetes.  DIABETIC HISTORY:  [name] was diagnosed with DM at age 40, Metformin caused GI side effects .SABRA Her hemoglobin A1c has ranged from 6.2% in 01/2020, peaking at 8.1% in 08/2020.  S/P renal transplant 06/2020- Through [facility]  No hx of dialysis    On her initial visit to our clinic she had an A1c of 6.5 %, we adjusted MDI regimen and provided her with correction scale  She was started on OmniPod in July 2023   SUBJECTIVE:   During the last visit (11/25/2023): A1c 8.5%     Today (07/24/2024): Ms. [name] is here for a follow up on diabetes.  She has not been using the dexcom because she thre    She continues to follow-up with kidney transplant team, due to history of renal transplant 2021, she continues to follow-up with local nephrologist   Weight continues to fluctuate No constipation or diarrhea  No nausea or vomiting    This patient with type 1 diabetes is treated with Omnipod (insulin  pump). During the visit the pump basal and bolus doses were reviewed including carb/insulin  rations and supplemental doses. The clinical list was updated. The glucose meter download was reviewed in detail to determine if the current pump settings are providing the best glycemic control without excessive hypoglycemia.  Pump and meter download:      Pump   Omnipod Settings   Insulin  type   Novolog    Basal rate       0000 2.2 u/h               I:C ratio       0000 1:6           Sensitivity       0000  30      Goal       0000  120             Type & Model of Pump: Omnipod Insulin  Type: Currently using Novolog  .  PUMP STATISTICS: Average BG: 194 Average Daily Carbs (g):135.5 Average Total Daily Insulin : 70.6 Average Daily Basal: 45.7(65 %) Average Daily Bolus:24.9( 35 %)    HOME DIABETES REGIMEN:  Novolog       Statin: yes ACE-I/ARB: no Prior Diabetic Education: yes    CONTINUOUS GLUCOSE MONITORING RECORD INTERPRETATION    Dates of Recording: 10/11-10/24/2025  Sensor description: Dexcom  Results statistics:   CGM use % of time 87.3  Average and SD 194/73  Time in range 51%  % Time Above 180 27  % Time above 250 22  % Time Below target 0   Glycemic patterns summary: BGs are optimal overnight and fluctuate during the day Hyperglycemic episodes postprandial  Hypoglycemic episodes occurred where  Overnight periods: Optimal      DIABETIC COMPLICATIONS: Microvascular complications:  Neuropathy, CKD (S/P transplant 2021), retinopathy ( S/P injections) Last eye exam: Completed 01/21/2024   Macrovascular complications:   Denies: CAD, CVA, PVD   HISTORY:  Past Medical History:  Past Medical  History:  Diagnosis Date   Abnormal Pap smear    Allergy    seasonal allergies   Anemia    Atypical squamous cells of undetermined significance (ASC-US ) on cervical Pap smear 10/09/2017   Cervical high risk HPV (human papillomavirus) test positive 10/09/2017   Chronic kidney disease    COVID-19 vaccine administered 06/23/2021   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   Diabetes mellitus    diagnosed age 88   Diabetes mellitus without complication (HCC)    Diabetic macular edema (HCC) 10/02/2016   Encounter for monitoring tacrolimus  therapy 06/28/2020   Glaucoma    Glaucoma 10/07/2017   Hyperlipidemia    Hypertension    Immunosuppressive management encounter following kidney transplant 10/18/2020   Left wrist  pain 10/10/2022   Metabolic acidosis 06/14/2020   Migraines    Noncompliance    Nuclear sclerotic cataract of both eyes 10/02/2016   Numbness in left leg    Unspecified abnormal cytological findings in specimens from cervix uteri 10/09/2017   Past Surgical History:  Past Surgical History:  Procedure Laterality Date   KIDNEY TRANSPLANT  06/2020   WISDOM TOOTH EXTRACTION     Social History:  reports that she has never smoked. She has never used smokeless tobacco. She reports current alcohol use. She reports that she does not use drugs. Family History:  Family History  Problem Relation Age of Onset   Hypertension Mother    Stroke Mother    Hypertension Father    Cancer Maternal Grandfather        prostate   Breast cancer Cousin        maternal 1st cousin   Cancer Other    COPD Other    Hyperlipidemia Other      HOME MEDICATIONS: Allergies as of 07/24/2024       Reactions   Garlic Nausea And Vomiting        Medication List        Accurate as of July 24, 2024  2:27 PM. If you have any questions, ask your nurse or doctor.          amLODipine  5 MG tablet Commonly known as: NORVASC  Take 1.5 tablets (7.5 mg total) by mouth daily.   atorvastatin  20 MG tablet Commonly known as: LIPITOR Take 1 tablet (20 mg total) by mouth daily.   azaTHIOprine  50 MG tablet Commonly known as: IMURAN  Take 100 mg by mouth daily.   benzonatate  200 MG capsule Commonly known as: TESSALON  Take 1 capsule (200 mg total) by mouth 2 (two) times daily as needed for cough.   TRUE METRIX BLOOD GLUCOSE TEST VI   Contour Next Test test strip Generic drug: glucose blood Use as instructed to check blood sugar TID.   cyanocobalamin  1000 MCG tablet Take by mouth.   Dexcom G7 Sensor Misc Change sensore every 10 days.   dextromethorphan  30 MG/5ML liquid Commonly known as: DELSYM  Take 5 mLs (30 mg total) by mouth 2 (two) times daily.   gabapentin  300 MG capsule Commonly known as:  NEURONTIN  Take 2 capsules (600 mg total) by mouth 2 (two) times daily. Needs to be seen prior to next refill request.   insulin  aspart 100 UNIT/ML injection Commonly known as: NovoLOG  Inject 80 units max daily dose. Use with pump   losartan  25 MG tablet Commonly known as: COZAAR  Take 1 tablet (25 mg total) by mouth daily.   nortriptyline  25 MG capsule Commonly known as: PAMELOR  Take 1 capsule (25 mg total) by mouth  at bedtime.   Omnipod 5 DexG7G6 Pods Gen 5 Misc Use as directed and change pod every 3 days   pantoprazole  40 MG tablet Commonly known as: PROTONIX  Take 1 tablet (40 mg total) by mouth daily.   predniSONE  5 MG tablet Commonly known as: DELTASONE  Take 5 mg by mouth daily with breakfast.   rizatriptan  10 MG disintegrating tablet Commonly known as: MAXALT -MLT Take 1 tablet (10 mg total) by mouth as needed for migraine. May repeat in 2 hours if needed.  Maximum 2 tablets in 24 hours.   Simbrinza  1-0.2 % Susp Generic drug: Brinzolamide -Brimonidine  Instill 1 drop into both eyes twice a day   tacrolimus  1 MG capsule Commonly known as: PROGRAF  Take 1 mg by mouth 2 (two) times daily.         OBJECTIVE:   Vital Signs: BP (!) 140/84 (BP Location: Left Arm, Patient Position: Sitting, Cuff Size: Normal)   Pulse (!) 104   Ht 5' 1 (1.549 m)   Wt 205 lb 12.8 oz (93.4 kg)   SpO2 97%   BMI 38.89 kg/m   Wt Readings from Last 3 Encounters:  07/24/24 205 lb 12.8 oz (93.4 kg)  06/23/24 201 lb (91.2 kg)  05/06/24 199 lb 3.2 oz (90.4 kg)     Exam: General: Pt appears well and is in NAD  Lungs: Clear with good BS bilat  Heart: RRR   Extremities: trace pretibial edema.   Neuro: MS is good with appropriate affect, pt is alert and Ox3   DM foot exam: 01/08/2024 per podiatry  The skin of the feet is intact without sores or ulcerations. The pedal pulses are 2+ on right and 2+ on left. The sensation is intact to a screening 5.07, 10 gram monofilament bilaterally            DATA REVIEWED:  Lab Results  Component Value Date   HGBA1C 8.1 (A) 07/24/2024   HGBA1C 8.2 (A) 03/24/2024   HGBA1C 8.5 (A) 11/25/2023   Labs on 03/12/2024 through [facility]  BUN 22 Cr 1.4 GFR 41    ASSESSMENT / PLAN / RECOMMENDATIONS:   1) Type 1 Diabetes Mellitus, Poorly controlled, With neuropathic, retinopathic , CKD III complications and S/P renal transplant   - Most recent A1c of 8.1 %. Goal A1c < 7.0 %.    -A1c continues to trend down but above goal -Patient has been noted with hyperglycemia starting in the afternoon -I will increase basal rate starting at noon and I will also adjust her insulin  to carb ratio from 6 to 5, I will also change her sensitivity factor from 30 to 25   MEDICATIONS:    Pump   Omnipod Settings   Insulin  type   Novolog    Basal rate       0000 2.2 u/h    1200 2.45          I:C ratio       0000 1:5                  Sensitivity       0000  25      Goal       0000  120           EDUCATION / INSTRUCTIONS: BG monitoring instructions: Patient is instructed to check her blood sugars 3 times a day, before meals  Call [facility] if: BG persistently < 70  I reviewed the Rule of 15 for the treatment of hypoglycemia  in detail with the patient. Literature supplied.    2) Diabetic complications:  Eye: Does  have known diabetic retinopathy.  Neuro/ Feet: Does have known diabetic peripheral neuropathy .  Renal: Patient does  have known baseline CKD, she is status post renal transplant. She   is not on an ACEI/ARB at present.    3) Dyslipidemia:   - LDL has been at goal at 64 MGs/DL in the past - No changes  Medication Continue atorvastatin  20 mg daily   F/U in 6 months   Signed electronically by: Stefano Redgie Butts, MD  [facility]  [facility] [address] [place] [place], [place] [address] Phone: [phone] FAX: [phone]   CC: Vicci Barnie NOVAK,  MD [address] [place] [address] Phone: [phone]  Fax: [phone]  Return to Endocrinology clinic as below: Future Appointments  Date Time Provider Department Center  09/01/2024  2:10 PM Vicci Barnie NOVAK, MD [facility] [address]  04/29/2025 10:30 AM Skeet Juliene SAUNDERS, DO [facility] None

## 2024-07-24 NOTE — Patient Instructions (Signed)

## 2024-07-28 ENCOUNTER — Encounter: Payer: Self-pay | Admitting: Internal Medicine

## 2024-07-28 ENCOUNTER — Other Ambulatory Visit: Payer: Self-pay

## 2024-07-29 ENCOUNTER — Other Ambulatory Visit: Payer: Self-pay

## 2024-07-31 DIAGNOSIS — H2513 Age-related nuclear cataract, bilateral: Secondary | ICD-10-CM | POA: Diagnosis not present

## 2024-07-31 DIAGNOSIS — E103413 Type 1 diabetes mellitus with severe nonproliferative diabetic retinopathy with macular edema, bilateral: Secondary | ICD-10-CM | POA: Diagnosis not present

## 2024-07-31 NOTE — Progress Notes (Signed)
 CHIEF COMPLAINT Chief Complaint  Patient presents with  . Follow Up Exam    Pt states  My vision seems blurrier since I was here last. BS is 85 @ 1:50 pm today. Last A1C# was 8.1 last Friday.      HISTORY OF PRESENT ILLNESS: Cathy Green is a 43 y.o. y.o. 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 She has hx of type 1 diabetes mellitus and nuclear sclerosis of both eyes. She is s/p IVA OD #1 10/02/16, s/p IVE OD #6 05/14/17, s/p IVE PAP OS #4 10/15/17, s/p IVE PAP OD #15 05/17/20, s/p IVE SAMPLE OS 08/01/21, and s/p IVE OS 05/22/22. Of note patient has had a kidney transplant and is on Azathioprine  and Tacrolimus .   Today the patient reports stable vision.  She denies ocular pain, flashes, floaters and other concerns at this time.  Denies any fevers, weight loss, night sweats.   Review of Systems:   Constitutional symptoms: negative Eyes:  negative Ear, nose, throat:  negative Cardiovascular:  negative Respiratory:  negative Gastrointestinal:  negative Genitourinary:  negative Skin:  negative Neurological:  negative Musculoskeletal:  negative Psychiatric:  negative Endocrine:  negative Hematological:  negative Allergic:  negative   Selected notes from the MEDICAL RECORD NUMBER     CURRENT DROPS: See med list  Referring physician: No referring provider defined for this encounter.  REVIEW OF SYSTEMS: See tech note  MEDICATIONS Current Outpatient Medications  Medication Sig Dispense Refill  . azaTHIOprine  (IMURAN ) 50 mg tablet Take 2 tablets (100 mg total) by mouth daily. 180 tablet 3  . brinzolamide -brimonidine  (Simbrinza ) 1-0.2 % drps Administer 1 drop into each eyes 2 (two) times a day.    . tacrolimus  (PROGRAF ) 1 mg capsule Take 2 capsules (2 mg total) by mouth 2 (two) times a day. 360 capsule 1  . amLODIPine  (NORVASC ) 5 mg tablet Take 1 tablet (5 mg total) by mouth daily. 90 tablet 2  . atorvastatin  (LIPITOR) 20  mg tablet     . cyanocobalamin  (VITAMIN B12) 1,000 mcg tablet Take 1,000 mcg by mouth Once Daily.    SABRA Dexcom G6 Sensor     . Dexcom G6 Transmitter devi     . ergocalciferol  (VITAMIN D2) 1,250 mcg (50,000 unit) capsule Take 1 capsule (50,000 Units total) by mouth once a week. 6 capsule 0  . fluticasone  propionate (FLONASE ) 50 mcg/spray nasal spray Administer 2 sprays into each nostril Once Daily.    . gabapentin  (NEURONTIN ) 300 mg capsule Take 300 mg by mouth 2 (two) times a day.    . losartan  (COZAAR ) 25 mg tablet Take 25 mg by mouth daily.    . nortriptyline  (PAMELOR ) 10 mg capsule Take 10 mg by mouth.    . NovoLOG  U-100 Insulin  aspart 100 unit/mL injection Inject 70 units max daily dose. Use with pump    . Omnipod 5 G6-G7 Pods, Gen 5, crtg subcutaneous cartridge     . pantoprazole  (PROTONIX ) 40 mg EC tablet Take 1 tablet (40 mg total) by mouth daily. 30 tablet 2  . predniSONE  (DELTASONE ) 5 mg tablet Take 1 tablet (5 mg) by mouth once daily or as directed by transplant clinic. 90 tablet 2  . rizatriptan  MLT (MAXALT -MLT) 10 mg disintegrating tablet Dissolve 10 mg on tongue.     No current facility-administered medications for this visit.     ALLERGIES Allergies  Allergen Reactions  . Garlic GI Intolerance  PAST MEDICAL HISTORY Past Surgical History:  Procedure Laterality Date  . KIDNEY TRANSPLANT N/A 06/22/2020   Procedure: KIDNEY TRANSPLANT LIVING DONOR  UNOS ID # [id number] KIDNEY IN ROOM:1706 BENCH T:1733 ANASTOMOSIS T:2209 REPREFUSION [id number];  Surgeon: Branda Hire, MD;  Location: [facility] MAIN OR;  Service: Transplant;  Laterality: N/A;  KIDNEY ARRIVING AT 5:30 PM.  THIS KIDNEY IS PART OF THE NKR CHAIN.  . WISDOM TOOTH EXTRACTION     Procedure: WISDOM TOOTH EXTRACTION    FAMILY HISTORY Family History  Problem Relation Name Age of Onset  . Stroke Mother    . Glaucoma Maternal Grandmother    . Hypertension Maternal Grandmother    . Cancer Maternal Grandfather    . Glaucoma  Maternal Grandfather    . Hypertension Maternal Grandfather       SOCIAL HISTORY Social History   Socioeconomic History  . Marital status: Single    Spouse name: Not on file  . Number of children: Not on file  . Years of education: Not on file  . Highest education level: Not on file  Occupational History  . Not on file  Tobacco Use  . Smoking status: Never  . Smokeless tobacco: Never  Substance and Sexual Activity  . Alcohol use: Yes  . Drug use: Not Currently    Types: Marijuana  . Sexual activity: Not on file  Other Topics Concern  . Not on file  Social History Narrative  . Not on file   Social Drivers of Health   Food Insecurity: No Food Insecurity (06/23/2024)   Received from [facility]   Food vital sign   . Within the past 12 months, you worried that your food would run out before you got money to buy more: Never true   . Within the past 12 months, the food you bought just didn't last and you didn't have money to get more: Never true  Transportation Needs: No Transportation Needs (06/23/2024)   Received from [facility] - Transportation   . In the past 12 months, has lack of transportation kept you from medical appointments or from getting medications?: No   . In the past 12 months, has lack of transportation kept you from meetings, work, or from getting things needed for daily living?: No  Safety: Not At Risk (03/11/2024)   Received from [facility]   Safety   . Within the last year, have you been afraid of your partner or ex-partner?: No   . Within the last year, have you been humiliated or emotionally abused in other ways by your partner or ex-partner?: No   . Within the last year, have you been kicked, hit, slapped, or otherwise physically hurt by your partner or ex-partner?: No   . Within the last year, have you been raped or forced to have any kind of sexual activity by your partner or ex-partner?: No  Living Situation: Low Risk  (03/11/2024)    Received from [facility] Situation   . In the last 12 months, was there a time when you were not able to pay the mortgage or rent on time?: No   . In the past 12 months, how many times have you moved where you were living?: 0   . At any time in the past 12 months, were you homeless or living in a shelter (including now)?: No       OPHTHALMIC EXAM: Base Eye Exam     Visual Acuity (  Snellen - Linear)       Right Left   Dist cc 20/30 -1 20/50 -1   Dist ph cc NI 20/40 -1    Correction: Glasses         Tonometry (Tonopen: Tetracaine OU, 2:01 PM)       Right Left   Pressure 17 18         Pupils       Shape APD   Right Round None   Left Round None         Visual Fields       Left Right    Full Full         Extraocular Movement       Right Left    Full Full         Neuro/Psych     Oriented x3: Yes   Mood/Affect: Normal         Dilation     Both eyes: 2.5% Phenylephrine, 1.0% Tropicamide @ 1:57 PM           Slit Lamp and Fundus Exam     External Exam       Right Left   External Normal Normal         Slit Lamp Exam       Right Left   Lids/Lashes Normal Normal   Conjunctiva/Sclera White and quiet White and quiet   Cornea Clear Clear   Anterior Chamber Deep and quiet, no cell/flare Deep and quiet, no cell/flare   Iris Round and reactive Round and reactive   Lens 2+ NS, 1+ CC 2+ posterior cortical changes, 2+ NS   Anterior Vitreous Normal Normal         Fundus Exam       Right Left   Disc Uniform rim  Normal   C/D Ratio 0.4 0.3   Macula DBH, exudates, MAs, no CSME MAs, no CSME, DBH   Vessels Normal Normal   Periphery temporal exudation exudation superiorly             LABS No visits with results within 4 Week(s) from this visit.  Latest known visit with results is:  Lab on 06/15/2024  Component Date Value  . Phosphorus 06/15/2024 4.0   . Hemoglobin A1c 06/15/2024 8.3 (H)   . Estimated Average Glucose  06/15/2024 192   . Combined cPRA 06/15/2024 0   . Sample Draw date 06/15/2024 2024-06-15   . Screening Test date 06/15/2024 2024-06-23   . Class I Antibody Specifi* 06/15/2024 Negative   . Class II Antibody Specif* 06/15/2024 DR1 DQ5 DQA1*05:01 DQ2   . Class I DSA Specificities 06/15/2024 ([id number])   . Class II DSA Specificiti* 06/15/2024 ([id number])   . Report comment 06/15/2024 Kidney from [place] [facility] will be transplanted to this patient on 06/21/20.  <DFK>   . Disclaimer (FDA) 06/15/2024                     Value:HLA typing is performed by intermediate to high resolution SSP or NGS.   Crossmatches are performed by flow cytometry.  All procedures and reagents have been validated and performance characteristics determined by the HLA/Immunogenetics Laboratory.   Certain of these tests have not been cleared/approved by the U.S. Food and Drug Administration.  The FDA has determined that such approval is not necessary because this laboratory is certified under the Clinical Laboratory Improvement Amendments to  perform high complexity testing.  Additional information may be obtained by contacting  the laboratory.  . Disclaimer (CLIA and Lab* 06/15/2024                     Value:THIS LABORATORY IS CERTIFIED UNDER THE CLINICAL LABORATORY IMPROVEMENT AMENDMENTS OF 1988 (CLIA) AS QUALIFIED TO PERFORM HIGH COMPLEXITY CLINICAL TESTING. Reviewed and approved by: Michael D. Lamonte, Ph.D., F.ACHI Director, HLA/Immunogenetics and  Immunodiagnostics Laboratory.  . Magnesium  06/15/2024 1.7 (L)   . Sodium 06/15/2024 142   . Potassium 06/15/2024 4.4   . Chloride 06/15/2024 109 (H)   . CO2 06/15/2024 27   . Anion Gap 06/15/2024 6   . Glucose, Random 06/15/2024 115 (H)   . Blood Urea Nitrogen (BUN) 06/15/2024 24   . Creatinine 06/15/2024 1.65 (H)   . eGFR 06/15/2024 40 (L)   . Albumin  06/15/2024 4.0   . Total Protein 06/15/2024 7.2   . Bilirubin, Total 06/15/2024 0.7   . Alkaline Phosphatase (AL*  06/15/2024 63   . Aspartate Aminotransfera* 06/15/2024 13   . Alanine Aminotransferase* 06/15/2024 17   . Calcium  06/15/2024 9.3   . BUN/Creatinine Ratio 06/15/2024 14.5   . Tacrolimus , Whole Blood 06/15/2024 7.1   . Cytomegalovirus (CMV) 06/15/2024 Not Detected   . BK Virus (Blood) 06/15/2024 Not Detected   . Protein, Total, Urine 06/15/2024 65 (H)   . Creatinine, Urine 06/15/2024 205   . Protein/Creatinine Ratio* 06/15/2024 317 (H)   . Epstein-Barr Virus (EBV) 06/15/2024 Not Detected   . WBC 06/15/2024 5.10   . RBC 06/15/2024 4.32   . Hemoglobin 06/15/2024 11.8 (L)   . Hematocrit 06/15/2024 36.4   . Mean Corpuscular Volume * 06/15/2024 84.2   . Mean Corpuscular Hemoglo* 06/15/2024 27.3 (L)   . Mean Corpuscular Hemoglo* 06/15/2024 32.4 (L)   . Red Cell Distribution Wi* 06/15/2024 16.6   . Platelet Count (PLT) 06/15/2024 276   . Mean Platelet Volume (MP* 06/15/2024 8.7   . Neutrophils % 06/15/2024 61   . Lymphocytes % 06/15/2024 30   . Monocytes % 06/15/2024 8   . Eosinophils % 06/15/2024 1   . Basophils % 06/15/2024 0   . nRBC % 06/15/2024 0   . Neutrophils Absolute 06/15/2024 3.10   . Lymphocytes # 06/15/2024 1.50   . Monocytes # 06/15/2024 0.40   . Eosinophils # 06/15/2024 0.10   . Basophils # 06/15/2024 0.00   . nRBC Absolute 06/15/2024 0.00   . CK, Total 06/15/2024 42      IMAGING   Imaging  Testing report: Optical coherence tomography Date obtained - 01/21/24 Indication for imaging: Diabetic Macular Edema Technically adequate study. Patient compliance high.    Right Eye:  Central foveal thickness: 262 Findings: NFD Comparison to previous: stable   Left Eye:  Central foveal thickness: 276 Findings: temporal retinal edema borderline clinically significant, NFD Comparison to previous: stable   Diagnosis / Impression: no retinal edema OD  Clinical management:  See below   Abbreviations: NFP--Normal foveal profile. Normal OCT. CME--cystoid macular edema.  PED--pigment epithelial detachment. SRF--Subretinal fluid.  EZ --ellipsoid zone. ERM Epiretinal membrane.. ORT - outer retinal tubulation. SRHM - subretinal hyper-reflective material     ASSESSMENT/PLAN:   1. Diabetic macular edema (HCC)  OCT, Macula - OU - Both Eyes    2. Severe nonproliferative diabetic retinopathy of both eyes with macular edema associated with type 1 diabetes mellitus    (CMD)      3. Nuclear sclerotic cataract of both eyes         1. Diabetic  macular edema - I have discussed the natural history of diabetic macular edema and treatment options including observation, laser photocoagulation, intravitreal antiVEGF injection (Avastin , Lucentis and Eylea) and intravitreal steroids (triamcinolone  or  Ozurdex). I have discussed in the detail the risks/benefits/alternatives  and the complications of these procedures including loss of vision, infection, cataract, glaucoma, and retinal detachment. I have stressed the importance of optimization of cardiovascular risk factors with the patient.  - Completed Eylea paperwork 10/02/16 - Pt may need laser in [place] clinic in the future  - OD continues to improve; OS shows stable retinal edema on OCT. We will continue to monitor her right eye and turn our focus on to the left eye.  - s/p IVA OD #1 10/02/16 - s/p IVE OD PAP #15 05/17/20 - s/p IVE OS #9 05/22/22 - Fill out Eylea paperwork  - Exam today 07/31/2024 shows MAs, no CSME OU  - No further treatment required at this time - I will monitor - Continue good glycemic control  - Follow up in 6 months for reevaluation    2. Severe nonproliferative diabetic retinopathy of both eyes, with macular edema, associated with type 1 diabetes mellitus  - I have stressed strict glycemic control with a goal A1c< 7.0 along with continued optimization of serum lipids, blood pressure , and avoiding cigarette or any type of tobacco, and the need for long term follow up was also discussed with  patient. - I have stressed the importance of optimization of cardiovascular risk factors with the patient.  - Continue to maintain good glycemic control  - See above   3. Nuclear sclerotic cataract of both eyes - Likely becoming visually significant  - Patient is certainly clear from retina perspective to proceed with cataract surgery whenever necessary.       =====  Paticia Fairly, MD  Diseases and Surgery of the Retina and Vitreous and  Uveitis and Ocular Immunology Assistant Professor of Ophthalmology Department of Ophthalmology  [facility]   Requested Prescriptions    No prescriptions requested or ordered in this encounter        Return in about 6 months (around 01/28/2025).     Abbreviations DME diabetic macular edema; PDR proliferative diabetic retinopathy; NPDR non proliferative diabetic retinopathy;  BRVO  Branch retinal vein occlusion; CRVO central retinal vein occlusion; ; RD retinal detachment; RT retinal tear; SB scleral buckle; PPV pars plana vitrectomy; VH  Vitreous hemorrhage; PRP  panretinal laser photocoagulation; IVK intravitreal kenalog; IVA intravitreal avastin; IVL intravitreal Lucentis; IVE Intravitreal Eylea; PVD posterior vitreous detachment; OD right eye; OS left eye; OU  Both eyes;  VMT  vitreomacular traction; MH  Macular hole; ERM epiretinal membrane; NVD neovascularization of the disc; NVE neovascularization elsewhere; AREDS age related eye disease study   I agree the documentation is accurate and complete.  Electronically signed by: Paticia FORBES Fairly MD, MD 08/05/2024 9:10 AM   Electronically signed by: Paticia FORBES Fairly MD, MD 08/05/2024 9:10 AM

## 2024-08-19 ENCOUNTER — Other Ambulatory Visit: Payer: Self-pay

## 2024-09-01 ENCOUNTER — Other Ambulatory Visit: Payer: Self-pay

## 2024-09-01 ENCOUNTER — Ambulatory Visit: Attending: Internal Medicine | Admitting: Internal Medicine

## 2024-09-01 ENCOUNTER — Encounter: Payer: Self-pay | Admitting: Internal Medicine

## 2024-09-01 VITALS — BP 161/93 | HR 103 | Temp 98.1°F | Ht 61.0 in | Wt 211.0 lb

## 2024-09-01 DIAGNOSIS — R600 Localized edema: Secondary | ICD-10-CM

## 2024-09-01 DIAGNOSIS — I152 Hypertension secondary to endocrine disorders: Secondary | ICD-10-CM | POA: Diagnosis not present

## 2024-09-01 DIAGNOSIS — E1059 Type 1 diabetes mellitus with other circulatory complications: Secondary | ICD-10-CM

## 2024-09-01 DIAGNOSIS — E1069 Type 1 diabetes mellitus with other specified complication: Secondary | ICD-10-CM | POA: Diagnosis not present

## 2024-09-01 DIAGNOSIS — E1021 Type 1 diabetes mellitus with diabetic nephropathy: Secondary | ICD-10-CM | POA: Diagnosis not present

## 2024-09-01 MED ORDER — FUROSEMIDE 20 MG PO TABS
20.0000 mg | ORAL_TABLET | Freq: Every day | ORAL | 3 refills | Status: AC
  Filled 2024-09-01: qty 30, 30d supply, fill #0
  Filled 2024-10-14: qty 30, 30d supply, fill #1

## 2024-09-01 NOTE — Progress Notes (Unsigned)
 Patient ID: Cathy Green, female    DOB: 1981-07-09  MRN: [id number]  CC: Follow-up (Follow-up. /Discuss weight loss options - unintentional weight gain, reports good eating habits/Swelling of bilateral ankles  / legs X4 mo/Already received flu vax)   Subjective: Cathy Green is a 43 y.o. female who presents for chronic ds management. 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   Discussed the use of AI scribe software for clinical note transcription with the patient, who gave verbal consent to proceed.  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) 11/14/2020   Insulin  dependent type 1 diabetes mellitus (HCC) 11/14/2020   Hyperlipidemia due to type 1 diabetes mellitus (HCC) 08/02/2020   Encounter for aftercare following kidney transplant 06/28/2020   ESRD (end stage renal disease) (HCC) 06/23/2020   Hypoglycemia due to insulin  02/12/2020   CKD stage 5 due to type 1 diabetes mellitus (HCC) 09/13/2019   Migraine without aura and without status migrainosus, not intractable 06/22/2019   Chest pain 11/04/2018   Hyperkalemia 11/04/2018   Diabetic retinopathy (HCC) 04/26/2017   Proteinuria 11/01/2015   Normocytic anemia 09/07/2011   Hyperlipidemia 09/07/2011   Hypertension 09/07/2011   Anemia of renal disease  09/07/2011   Controlled type 1 diabetes with renal manifestation (HCC) 09/07/2011     Current Outpatient Medications on File Prior to Visit  Medication Sig Dispense Refill   amLODipine  (NORVASC ) 5 MG tablet Take 1.5 tablets (7.5 mg total) by mouth daily. 135 tablet 1   atorvastatin  (LIPITOR) 20 MG tablet Take 1 tablet (20 mg total) by mouth daily. 90 tablet 3   azaTHIOprine  (IMURAN ) 50 MG tablet Take 100 mg by mouth daily.     Brinzolamide -Brimonidine  (SIMBRINZA ) 1-0.2 % SUSP Instill 1 drop into both eyes twice a day 8 mL 11   Continuous Glucose Sensor (DEXCOM G7 SENSOR) MISC Change sensore every 10 days. 9 each 3   gabapentin  (NEURONTIN ) 300 MG capsule Take 2 capsules (600 mg total) by mouth 2 (two) times daily. Needs to be seen prior to next refill request. 360 capsule 3   glucose blood (CONTOUR NEXT TEST) test strip Use as instructed to check blood sugar TID. 100 each 6   Glucose Blood (TRUE METRIX BLOOD GLUCOSE TEST VI)      insulin  aspart (NOVOLOG ) 100 UNIT/ML injection Inject 80 units max daily dose. Use with pump 30 mL 11   Insulin  Disposable Pump (OMNIPOD 5 DEXG7G6 PODS GEN 5) MISC Use as directed and change pod every 3 days 30 each 3   losartan  (COZAAR ) 25 MG tablet Take 1 tablet (25 mg total) by mouth daily. 90 tablet 1   nortriptyline  (PAMELOR ) 25 MG capsule Take 1 capsule (25 mg total) by mouth at bedtime. 30 capsule 11   pantoprazole  (PROTONIX ) 40 MG tablet Take 1 tablet (40 mg total) by mouth daily.  30 tablet 2   predniSONE  (DELTASONE ) 5 MG tablet Take 5 mg by mouth daily with breakfast.     rizatriptan  (MAXALT -MLT) 10 MG disintegrating tablet Take 1 tablet (10 mg total) by mouth as needed for migraine. May repeat in 2 hours if needed.  Maximum 2 tablets in 24 hours. 9 tablet 11   tacrolimus  (PROGRAF ) 1 MG capsule Take 1 mg by mouth 2 (two) times daily.     benzonatate  (TESSALON ) 200 MG capsule Take 1 capsule (200 mg total) by mouth 2 (two) times daily as needed for cough.  (Patient not taking: Reported on 09/01/2024) 30 capsule 0   cyanocobalamin  1000 MCG tablet Take by mouth. (Patient not taking: Reported on 09/01/2024)     dextromethorphan  (DELSYM ) 30 MG/5ML liquid Take 5 mLs (30 mg total) by mouth 2 (two) times daily. (Patient not taking: Reported on 09/01/2024) 89 mL 0   No current facility-administered medications on file prior to visit.    Allergies  Allergen Reactions   Garlic Nausea And Vomiting    Social History   Socioeconomic History   Marital status: Significant Other    Spouse name: Not on file   Number of children: Not on file   Years of education: Not on file   Highest education level: Bachelor's degree (e.g., BA, AB, BS)  Occupational History   Not on file  Tobacco Use   Smoking status: Never   Smokeless tobacco: Never  Vaping Use   Vaping status: Never Used  Substance and Sexual Activity   Alcohol use: Yes    Comment: occasional    Drug use: No   Sexual activity: Never    Birth control/protection: Pill  Other Topics Concern   Not on file  Social History Narrative   ** Merged History Encounter **       Social Drivers of Health   Financial Resource Strain: Low Risk  (08/28/2024)   Overall Financial Resource Strain (CARDIA)    Difficulty of Paying Living Expenses: Not very hard  Food Insecurity: No Food Insecurity (08/28/2024)   Hunger Vital Sign    Worried About Running Out of Food in the Last Year: Never true    Ran Out of Food in the Last Year: Never true  Transportation Needs: No Transportation Needs (08/28/2024)   PRAPARE - Administrator, Civil Service (Medical): No    Lack of Transportation (Non-Medical): No  Physical Activity: Sufficiently Active (08/28/2024)   Exercise Vital Sign    Days of Exercise per Week: 5 days    Minutes of Exercise per Session: 30 min  Stress: No Stress Concern Present (08/28/2024)   [employer] of Occupational Health - Occupational Stress Questionnaire    Feeling of  Stress: Not at all  Social Connections: Moderately Isolated (08/28/2024)   Social Connection and Isolation Panel    Frequency of Communication with Friends and Family: More than three times a week    Frequency of Social Gatherings with Friends and Family: More than three times a week    Attends Religious Services: 1 to 4 times per year    Active Member of [employer] or Organizations: No    Attends Banker Meetings: Not on file    Marital Status: Never married  Intimate Partner Violence: Not At Risk (03/11/2024)   Humiliation, Afraid, Rape, and Kick questionnaire    Fear of Current or Ex-Partner: No    Emotionally Abused: No    Physically Abused: No    Sexually  Abused: No    Family History  Problem Relation Age of Onset   Hypertension Mother    Stroke Mother    Hypertension Father    Cancer Maternal Grandfather        prostate   Breast cancer Cousin        maternal 1st cousin   Cancer Other    COPD Other    Hyperlipidemia Other     Past Surgical History:  Procedure Laterality Date   KIDNEY TRANSPLANT  06/2020   WISDOM TOOTH EXTRACTION      ROS: Review of Systems Negative except as stated above  PHYSICAL EXAM: BP (!) 161/93 (BP Location: Left Arm, Patient Position: Sitting, Cuff Size: Large)   Pulse (!) 103   Temp 98.1 F (36.7 C) (Oral)   Ht 5' 1 (1.549 m)   Wt 211 lb (95.7 kg)   SpO2 99%   BMI 39.87 kg/m   Wt Readings from Last 3 Encounters:  09/01/24 211 lb (95.7 kg)  07/24/24 205 lb 12.8 oz (93.4 kg)  06/23/24 201 lb (91.2 kg)    Physical Exam  {female adult master:[id number]}      Latest Ref Rng & Units 03/13/2024    5:34 AM 03/12/2024    7:02 AM 03/11/2024    5:33 AM  CMP  Glucose 70 - 99 mg/dL 840  831  764   BUN 6 - 20 mg/dL 22  24  21    Creatinine 0.44 - 1.00 mg/dL 8.59  8.46  8.62   Sodium 135 - 145 mmol/L 137  136  134   Potassium 3.5 - 5.1 mmol/L 4.4  4.3  5.2   Chloride 98 - 111 mmol/L 110  108  110   CO2 22 - 32 mmol/L 20  20  19     Calcium  8.9 - 10.3 mg/dL 8.6  8.7  9.2   Total Protein 6.5 - 8.1 g/dL   7.2   Total Bilirubin 0.0 - 1.2 mg/dL   0.6   Alkaline Phos 38 - 126 U/L   77   AST 15 - 41 U/L   17   ALT 0 - 44 U/L   45    Lipid Panel     Component Value Date/Time   CHOL 158 03/13/2023 1541   CHOL 227 (H) 05/27/2018 1602   TRIG 197.0 (H) 03/13/2023 1541   HDL 53.70 03/13/2023 1541   HDL 59 05/27/2018 1602   CHOLHDL 3 03/13/2023 1541   VLDL 39.4 03/13/2023 1541   LDLCALC 64 03/13/2023 1541   LDLCALC 140 (H) 05/27/2018 1602   LDLDIRECT 121.0 06/21/2021 1459    CBC    Component Value Date/Time   WBC 9.3 03/12/2024 0702   RBC 4.37 03/12/2024 0702   HGB 11.5 (L) 03/12/2024 0702   HGB 8.9 (L) 11/12/2018 1030   HGB 11.6 09/07/2011 1500   HCT 37.2 03/12/2024 0702   HCT 28.3 (L) 11/12/2018 1030   HCT 34.7 (L) 09/07/2011 1500   PLT 277 03/12/2024 0702   PLT 217 11/12/2018 1030   MCV 85.1 03/12/2024 0702   MCV 84 11/12/2018 1030   MCV 79.4 (L) 09/07/2011 1500   MCH 26.3 03/12/2024 0702   MCHC 30.9 03/12/2024 0702   RDW 17.2 (H) 03/12/2024 0702   RDW 12.9 11/12/2018 1030   RDW 13.8 09/07/2011 1500   LYMPHSABS 1.3 03/10/2024 1538   LYMPHSABS 1.6 11/12/2018 1030   LYMPHSABS 1.7 09/07/2011 1500   MONOABS 0.5 03/10/2024 1538   MONOABS  0.2 09/07/2011 1500   EOSABS 0.0 03/10/2024 1538   EOSABS 0.1 11/12/2018 1030   BASOSABS 0.0 03/10/2024 1538   BASOSABS 0.0 11/12/2018 1030   BASOSABS 0.0 09/07/2011 1500    ASSESSMENT AND PLAN:  Assessment and Plan Assessment & Plan      There are no diagnoses linked to this encounter.   Patient was given the opportunity to ask questions.  Patient verbalized understanding of the plan and was able to repeat key elements of the plan.   This documentation was completed using Paediatric nurse.  Any transcriptional errors are unintentional.  No orders of the defined types were placed in this encounter.    Requested Prescriptions     No prescriptions requested or ordered in this encounter    No follow-ups on file.  Barnie Louder, MD, FACP

## 2024-09-01 NOTE — Patient Instructions (Signed)
  VISIT SUMMARY: Today, you had a follow-up appointment to review your type 1 diabetes, hypertension, kidney transplant status, and other health concerns. We discussed your recent blood sugar levels, weight management challenges, and changes in your physical activity. We also reviewed your current medications and made some adjustments to better manage your conditions.  YOUR PLAN: -TYPE 1 DIABETES MELLITUS WITH DIABETIC NEPHROPATHY: Type 1 diabetes is a condition where your body does not produce insulin , and diabetic nephropathy is kidney damage resulting from diabetes. Your A1c is 8.1, indicating suboptimal blood sugar control. We will continue your current insulin  pump and continuous glucose monitoring. A urine test was ordered to check for protein in your urine. Please discuss with your endocrinologist about possibly using Ozempic or Mounjaro to help with weight and blood sugar control.  -HYPERTENSION ASSOCIATED WITH TYPE 1 DIABETES MELLITUS: Hypertension is high blood pressure, which can be associated with diabetes. Your blood pressure readings have been higher than desired. We have reduced your amlodipine  dose to 5 mg daily and added furosemide  20 mg daily to help manage your blood pressure and reduce swelling. Please continue to monitor your blood pressure regularly.  -CHRONIC KIDNEY DISEASE STATUS POST KIDNEY TRANSPLANT: Chronic kidney disease is long-term kidney damage, and you have had a kidney transplant. Your recent GFR is 40, indicating reduced kidney function. We ordered blood and urine tests to assess your kidney function and check for protein in your urine. We also ordered a BNP test to screen for heart failure and may consider an echocardiogram based on the results.  -OBESITY: Obesity is having excess body weight. Despite your dietary efforts, you have gained weight. Phentermine is not suitable for you due to your hypertension and kidney disease. Please discuss with your endocrinologist  about possibly using Ozempic or Mounjaro for weight management.  -DIABETIC RETINOPATHY: Diabetic retinopathy is damage to the eyes caused by diabetes. Your condition is well-managed, and no current injections are needed. Please continue with routine eye exams.  INSTRUCTIONS: Please follow up with your endocrinologist to discuss the potential use of Ozempic or Mounjaro for weight and glycemic control. Continue to monitor your blood pressure regularly and report any significant changes. You have upcoming appointments with your nephrologist and should complete the ordered blood and urine tests before your visit. If you experience increased shortness of breath or swelling, please seek medical attention.                      Contains text generated by [employer].                                 Contains text generated by Abridge.

## 2024-09-02 ENCOUNTER — Encounter: Payer: Self-pay | Admitting: Internal Medicine

## 2024-09-02 ENCOUNTER — Ambulatory Visit: Payer: Self-pay | Admitting: Internal Medicine

## 2024-09-03 LAB — HEPATIC FUNCTION PANEL
ALT: 38 IU/L — ABNORMAL HIGH (ref 0–32)
AST: 22 IU/L (ref 0–40)
Albumin: 4.1 g/dL (ref 3.9–4.9)
Alkaline Phosphatase: 82 IU/L (ref 41–116)
Bilirubin Total: 0.4 mg/dL (ref 0.0–1.2)
Bilirubin, Direct: 0.18 mg/dL (ref 0.00–0.40)
Total Protein: 7.7 g/dL (ref 6.0–8.5)

## 2024-09-03 LAB — MICROALBUMIN / CREATININE URINE RATIO
Creatinine, Urine: 104.5 mg/dL
Microalb/Creat Ratio: 505 mg/g{creat} — AB (ref 0–29)
Microalbumin, Urine: 527.8 ug/mL

## 2024-09-03 LAB — LIPID PANEL
Chol/HDL Ratio: 2.4 ratio (ref 0.0–4.4)
Cholesterol, Total: 183 mg/dL (ref 100–199)
HDL: 76 mg/dL (ref >39)
LDL Chol Calc (NIH): 90 mg/dL (ref 0–99)
Triglycerides: 98 mg/dL (ref 0–149)
VLDL Cholesterol Cal: 17 mg/dL (ref 5–40)

## 2024-09-03 LAB — BASIC METABOLIC PANEL WITH GFR
BUN/Creatinine Ratio: 20 (ref 9–23)
BUN: 33 mg/dL — ABNORMAL HIGH (ref 6–24)
CO2: 18 mmol/L — ABNORMAL LOW (ref 20–29)
Calcium: 9.4 mg/dL (ref 8.7–10.2)
Chloride: 103 mmol/L (ref 96–106)
Creatinine, Ser: 1.66 mg/dL — ABNORMAL HIGH (ref 0.57–1.00)
Glucose: 172 mg/dL — ABNORMAL HIGH (ref 70–99)
Potassium: 4.8 mmol/L (ref 3.5–5.2)
Sodium: 136 mmol/L (ref 134–144)
eGFR: 39 mL/min/1.73 — ABNORMAL LOW (ref >59)

## 2024-09-03 LAB — SPECIMEN STATUS REPORT

## 2024-09-03 LAB — BRAIN NATRIURETIC PEPTIDE: BNP: 4.6 pg/mL (ref 0.0–100.0)

## 2024-09-07 ENCOUNTER — Other Ambulatory Visit: Payer: Self-pay

## 2024-09-08 ENCOUNTER — Other Ambulatory Visit: Payer: Self-pay

## 2024-09-09 ENCOUNTER — Other Ambulatory Visit: Payer: Self-pay

## 2024-09-15 ENCOUNTER — Other Ambulatory Visit: Payer: Self-pay | Admitting: Nurse Practitioner

## 2024-09-15 ENCOUNTER — Ambulatory Visit
Admission: RE | Admit: 2024-09-15 | Discharge: 2024-09-15 | Disposition: A | Source: Ambulatory Visit | Attending: Nurse Practitioner

## 2024-09-15 DIAGNOSIS — M25562 Pain in left knee: Secondary | ICD-10-CM

## 2024-09-15 DIAGNOSIS — S8002XA Contusion of left knee, initial encounter: Secondary | ICD-10-CM

## 2024-09-15 DIAGNOSIS — W109XXA Fall (on) (from) unspecified stairs and steps, initial encounter: Secondary | ICD-10-CM

## 2024-09-15 DIAGNOSIS — M25572 Pain in left ankle and joints of left foot: Secondary | ICD-10-CM

## 2024-09-17 ENCOUNTER — Other Ambulatory Visit: Payer: Self-pay

## 2024-09-17 MED FILL — Brinzolamide-Brimonidine Tartrate Ophth Susp 1-0.2%: 1.0000 [drp] | OPHTHALMIC | 40 days supply | Qty: 8 | Fill #0 | Status: AC

## 2024-10-06 ENCOUNTER — Other Ambulatory Visit: Payer: Self-pay

## 2024-10-19 ENCOUNTER — Other Ambulatory Visit: Payer: Self-pay

## 2024-10-20 ENCOUNTER — Other Ambulatory Visit: Payer: Self-pay | Admitting: Internal Medicine

## 2024-11-05 ENCOUNTER — Other Ambulatory Visit: Payer: Self-pay

## 2024-11-05 MED ORDER — VITAMIN D (ERGOCALCIFEROL) 1.25 MG (50000 UNIT) PO CAPS
50000.0000 [IU] | ORAL_CAPSULE | ORAL | 0 refills | Status: AC
  Filled 2024-11-05: qty 3, 90d supply, fill #0

## 2024-11-05 MED ORDER — MAGNESIUM OXIDE 400 MG PO TABS
400.0000 mg | ORAL_TABLET | Freq: Every day | ORAL | 1 refills | Status: AC
  Filled 2024-11-05: qty 90, 90d supply, fill #0

## 2024-11-30 ENCOUNTER — Ambulatory Visit: Admitting: Internal Medicine

## 2025-01-22 ENCOUNTER — Ambulatory Visit: Admitting: Internal Medicine

## 2025-04-29 ENCOUNTER — Ambulatory Visit: Admitting: Neurology
